# Patient Record
Sex: Female | Born: 1945 | Race: White | Hispanic: No | Smoking: Former smoker
Health system: Southern US, Community
[De-identification: ages and names within clinical notes are randomized; demographics above are authoritative.]

## PROBLEM LIST (undated history)

## (undated) DIAGNOSIS — F419 Anxiety disorder, unspecified: Secondary | ICD-10-CM

## (undated) DIAGNOSIS — F32A Depression, unspecified: Secondary | ICD-10-CM

## (undated) DIAGNOSIS — F17200 Nicotine dependence, unspecified, uncomplicated: Secondary | ICD-10-CM

## (undated) DIAGNOSIS — R9389 Abnormal findings on diagnostic imaging of other specified body structures: Secondary | ICD-10-CM

## (undated) DIAGNOSIS — F329 Major depressive disorder, single episode, unspecified: Secondary | ICD-10-CM

## (undated) HISTORY — PX: CATARACT EXTRACTION, BILATERAL: SHX1313

## (undated) HISTORY — DX: Major depressive disorder, single episode, unspecified: F32.9

## (undated) HISTORY — DX: Depression, unspecified: F32.A

## (undated) HISTORY — DX: Abnormal findings on diagnostic imaging of other specified body structures: R93.89

## (undated) HISTORY — DX: Nicotine dependence, unspecified, uncomplicated: F17.200

## (undated) HISTORY — DX: Anxiety disorder, unspecified: F41.9

---
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
## 1977-07-07 HISTORY — PX: ABDOMINAL HYSTERECTOMY: SHX81

## 1999-07-16 ENCOUNTER — Encounter: Admission: RE | Admit: 1999-07-16 | Discharge: 1999-07-16 | Payer: Self-pay | Admitting: Family Medicine

## 1999-07-16 ENCOUNTER — Encounter: Payer: Self-pay | Admitting: Family Medicine

## 1999-07-29 ENCOUNTER — Encounter: Admission: RE | Admit: 1999-07-29 | Discharge: 1999-07-29 | Payer: Self-pay | Admitting: Family Medicine

## 1999-07-29 ENCOUNTER — Encounter: Payer: Self-pay | Admitting: Family Medicine

## 2000-01-28 ENCOUNTER — Encounter: Admission: RE | Admit: 2000-01-28 | Discharge: 2000-01-28 | Payer: Self-pay | Admitting: Family Medicine

## 2000-01-28 ENCOUNTER — Encounter: Payer: Self-pay | Admitting: Family Medicine

## 2001-01-20 ENCOUNTER — Encounter: Payer: Self-pay | Admitting: Family Medicine

## 2001-01-20 ENCOUNTER — Encounter: Admission: RE | Admit: 2001-01-20 | Discharge: 2001-01-20 | Payer: Self-pay | Admitting: Family Medicine

## 2001-01-21 ENCOUNTER — Ambulatory Visit (HOSPITAL_COMMUNITY): Admission: RE | Admit: 2001-01-21 | Discharge: 2001-01-21 | Payer: Self-pay | Admitting: Family Medicine

## 2001-01-21 ENCOUNTER — Encounter: Payer: Self-pay | Admitting: Family Medicine

## 2001-01-26 ENCOUNTER — Encounter: Admission: RE | Admit: 2001-01-26 | Discharge: 2001-01-26 | Payer: Self-pay | Admitting: Family Medicine

## 2001-01-26 ENCOUNTER — Encounter: Payer: Self-pay | Admitting: Family Medicine

## 2001-03-18 ENCOUNTER — Other Ambulatory Visit: Admission: RE | Admit: 2001-03-18 | Discharge: 2001-03-18 | Payer: Self-pay | Admitting: Family Medicine

## 2001-12-08 ENCOUNTER — Encounter: Payer: Self-pay | Admitting: Family Medicine

## 2001-12-08 ENCOUNTER — Encounter: Admission: RE | Admit: 2001-12-08 | Discharge: 2001-12-08 | Payer: Self-pay | Admitting: Family Medicine

## 2002-03-21 ENCOUNTER — Other Ambulatory Visit: Admission: RE | Admit: 2002-03-21 | Discharge: 2002-03-21 | Payer: Self-pay | Admitting: Family Medicine

## 2002-05-16 ENCOUNTER — Encounter: Payer: Self-pay | Admitting: Family Medicine

## 2002-05-16 ENCOUNTER — Encounter: Admission: RE | Admit: 2002-05-16 | Discharge: 2002-05-16 | Payer: Self-pay | Admitting: Family Medicine

## 2002-07-26 ENCOUNTER — Emergency Department (HOSPITAL_COMMUNITY): Admission: EM | Admit: 2002-07-26 | Discharge: 2002-07-26 | Payer: Self-pay | Admitting: Emergency Medicine

## 2002-07-26 ENCOUNTER — Encounter: Payer: Self-pay | Admitting: Emergency Medicine

## 2003-02-24 ENCOUNTER — Ambulatory Visit (HOSPITAL_COMMUNITY): Admission: RE | Admit: 2003-02-24 | Discharge: 2003-02-24 | Payer: Self-pay | Admitting: Gastroenterology

## 2003-02-24 ENCOUNTER — Encounter (INDEPENDENT_AMBULATORY_CARE_PROVIDER_SITE_OTHER): Payer: Self-pay | Admitting: Specialist

## 2003-03-08 ENCOUNTER — Encounter: Payer: Self-pay | Admitting: Gastroenterology

## 2003-03-08 ENCOUNTER — Encounter: Admission: RE | Admit: 2003-03-08 | Discharge: 2003-03-08 | Payer: Self-pay | Admitting: Gastroenterology

## 2003-07-08 DIAGNOSIS — R9389 Abnormal findings on diagnostic imaging of other specified body structures: Secondary | ICD-10-CM

## 2003-07-08 HISTORY — DX: Abnormal findings on diagnostic imaging of other specified body structures: R93.89

## 2004-02-25 ENCOUNTER — Emergency Department (HOSPITAL_COMMUNITY): Admission: EM | Admit: 2004-02-25 | Discharge: 2004-02-25 | Payer: Self-pay | Admitting: Emergency Medicine

## 2004-07-12 ENCOUNTER — Encounter: Admission: RE | Admit: 2004-07-12 | Discharge: 2004-07-12 | Payer: Self-pay | Admitting: Family Medicine

## 2005-07-31 ENCOUNTER — Encounter: Admission: RE | Admit: 2005-07-31 | Discharge: 2005-07-31 | Payer: Self-pay | Admitting: Family Medicine

## 2006-08-20 ENCOUNTER — Encounter: Admission: RE | Admit: 2006-08-20 | Discharge: 2006-08-20 | Payer: Self-pay | Admitting: Family Medicine

## 2009-08-10 ENCOUNTER — Ambulatory Visit: Payer: Self-pay | Admitting: Family Medicine

## 2009-12-28 ENCOUNTER — Emergency Department (HOSPITAL_COMMUNITY): Admission: EM | Admit: 2009-12-28 | Discharge: 2009-12-28 | Payer: Self-pay | Admitting: Family Medicine

## 2010-02-07 ENCOUNTER — Encounter: Admission: RE | Admit: 2010-02-07 | Discharge: 2010-02-07 | Payer: Self-pay | Admitting: Family Medicine

## 2010-04-09 ENCOUNTER — Emergency Department (HOSPITAL_BASED_OUTPATIENT_CLINIC_OR_DEPARTMENT_OTHER): Admission: EM | Admit: 2010-04-09 | Discharge: 2010-04-09 | Payer: Self-pay | Admitting: Emergency Medicine

## 2010-07-28 ENCOUNTER — Encounter: Payer: Self-pay | Admitting: Family Medicine

## 2010-07-29 ENCOUNTER — Encounter: Payer: Self-pay | Admitting: Family Medicine

## 2010-08-08 NOTE — Assessment & Plan Note (Signed)
Summary: CHECK FINGER AND LIP/TJ   Vital Signs:  Patient Profile:   65 Years Old Female CC:      sore on right thumb and red bumps on right leg Height:     63 inches Weight:      139 pounds O2 Sat:      99 % O2 treatment:    Room Air Temp:     97.1 degrees F oral Pulse rate:   84 / minute Pulse rhythm:   regular Resp:     14 per minute BP sitting:   117 / 78  (right arm) Cuff size:   regular  Pt. in pain?   no  Vitals Entered By: Lita Mains, RN                   Updated Prior Medication List: Prudy Feeler 0.25 MG TABS (ALPRAZOLAM) tid EFFEXOR XR 37.5 MG XR24H-CAP (VENLAFAXINE HCL) once daily  Current Allergies: ! SULFA ! PENICILLIN G POTASSIUM (PENICILLIN G POTASSIUM)History of Present Illness History from: patient Chief Complaint: sore on right thumb and red bumps on right leg History of Present Illness: Subjective:  Patient complains of several day history of sore crack on tip of right thumb, now almost healed.  She also noticed an itchy red spon on her right leg just above the knee, but states that it has resolved.  REVIEW OF SYSTEMS       Skin       Complains of unusual moles/lumps or sores.      Comments: right thumb Other Comments: worried about infection, recently close to septic patient. 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  Area is nontender with eschar in place Right lower leg:  No lesions present. Assessment New Problems: WORRIED WELL (ICD-V65.5) FAMILY HISTORY DIABETES 1ST DEGREE RELATIVE (ICD-V18.0)   Plan New  Orders: New Patient Level III [99203] Planning Comments:   Reassurance.  Fingertip bandaid applied.   The patient and/or caregiver has been counseled thoroughly with regard to medications prescribed including dosage, schedule, interactions, rationale for use, and possible side effects and they verbalize understanding.  Diagnoses and expected course of recovery discussed and will return if not improved as expected or if the condition worsens. Patient and/or caregiver verbalized understanding.

## 2010-08-17 ENCOUNTER — Ambulatory Visit (INDEPENDENT_AMBULATORY_CARE_PROVIDER_SITE_OTHER): Payer: Self-pay | Admitting: Emergency Medicine

## 2010-08-17 ENCOUNTER — Encounter: Payer: Self-pay | Admitting: Emergency Medicine

## 2010-08-17 DIAGNOSIS — L255 Unspecified contact dermatitis due to plants, except food: Secondary | ICD-10-CM | POA: Insufficient documentation

## 2010-08-22 NOTE — Assessment & Plan Note (Signed)
Summary: RASH/WSE (rm 4)   Vital Signs:  Patient Profile:   65 Years Old Female CC:      ?poison ivy or oak x 2 days Height:     63 inches Weight:      126 pounds O2 Sat:      99 % O2 treatment:    Room Air Temp:     98.6 degrees F oral Pulse rate:   65 / minute Resp:     16 per minute BP sitting:   106 / 66  (left arm) Cuff size:   regular  Vitals Entered By: Lajean Saver RN (August 17, 2010 11:01 AM)                  Updated Prior Medication List: XANAX 0.25 MG TABS (ALPRAZOLAM) tid EFFEXOR XR 37.5 MG XR24H-CAP (VENLAFAXINE HCL) once daily  Current Allergies (reviewed today): ! SULFA ! PENICILLIN G POTASSIUM (PENICILLIN G POTASSIUM)History of Present Illness Chief Complaint: ?poison ivy or oak x 2 days History of Present Illness: Working in her yard 2 days ago. c/o pruritic rash on trunk and extremities, now spreading to neck and face. X 2 days. OTC topical not helping. No lip swelling or wheezing.  In the past, had similar poison oak, and she requests "cortisone shot and prednisone dosepak"  REVIEW OF SYSTEMS Constitutional Symptoms      Denies fever, chills, night sweats, weight loss, weight gain, and fatigue.  Eyes       Denies change in vision, eye pain, eye discharge, glasses, contact lenses, and eye surgery. Ear/Nose/Throat/Mouth       Denies hearing loss/aids, change in hearing, ear pain, ear discharge, dizziness, frequent runny nose, frequent nose bleeds, sinus problems, sore throat, hoarseness, and tooth pain or bleeding.  Respiratory       Denies dry cough, productive cough, wheezing, shortness of breath, asthma, bronchitis, and emphysema/COPD.  Cardiovascular       Denies murmurs, chest pain, and tires easily with exhertion.    Gastrointestinal       Denies stomach pain, nausea/vomiting, diarrhea, constipation, blood in bowel movements, and indigestion. Genitourniary       Denies painful urination, kidney stones, and loss of urinary  control. Neurological       Denies paralysis, seizures, and fainting/blackouts. Musculoskeletal       Denies muscle pain, joint pain, joint stiffness, decreased range of motion, redness, swelling, muscle weakness, and gout.  Skin       Denies bruising, unusual mles/lumps or sores, and hair/skin or nail changes.      Comments: arms and neck Psych       Denies mood changes, temper/anger issues, anxiety/stress, speech problems, depression, and sleep problems. Other Comments: patient c/o rash to Bilateral arms and neck x 2 days. She did yard work about 3 days ago   Past History:  Past Medical History: Reviewed history from 08/10/2009 and no changes required. Unremarkable  Past Surgical History: Reviewed history from 08/10/2009 and no changes required. Hysterectomy  Family History: Reviewed history from 08/10/2009 and no changes required. Family History Diabetes 1st degree relative  Social History: Reviewed history from 08/10/2009 and no changes required. 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 No lip swelling. Eyes: conjunctivae and lids normal Oral/Pharynx: clear.  Chest/Lungs: no rales, wheezes, or rhonchi bilateral, breath sounds equal without effort Heart: regular rate and  rhythm,  no murmur Skin: vesicular , mp rash in streaks and clusters, arms, legs, trunck, neck, and few patches on face Assessment New Problems: CONTACT DERMATITIS&OTHER ECZEMA DUE TO PLANTS (ICD-692.6)   Plan New Medications/Changes: PREDNISONE (PAK) 10 MG TABS (PREDNISONE) 6 day dosepak as directed  #21 x 0, 08/17/2010, Lajean Manes MD  New Orders: Est. Patient Level III [04540] Depo- Medrol 80mg  [J1040] Admin of Therapeutic Inj  intramuscular or subcutaneous [96372] Planning Comments:   Depomedrol IM. Risks, benefits, alternatives discussed. Pt voiced  understanding and agreement.  Other sxs care discussed. OTC by mouth benadryl as needed itch.  Follow Up: Follow up on an as needed basis, Follow up with Primary Physician  The patient and/or caregiver has been counseled thoroughly with regard to medications prescribed including dosage, schedule, interactions, rationale for use, and possible side effects and they verbalize understanding.  Diagnoses and expected course of recovery discussed and will return if not improved as expected or if the condition worsens. Patient and/or caregiver verbalized understanding.  Prescriptions: PREDNISONE (PAK) 10 MG TABS (PREDNISONE) 6 day dosepak as directed  #21 x 0   Entered and Authorized by:   Lajean Manes MD   Signed by:   Lajean Manes MD on 08/17/2010   Method used:   Handwritten   RxID:   [id number]   Medication Administration  Injection # 1:    Medication: Depo- Medrol 40mg     Diagnosis: CONTACT DERMATITIS&OTHER ECZEMA DUE TO PLANTS (ICD-692.6)    Route: IM    Site: RUOQ gluteus    Exp Date: 01/05/2011    Lot #: [id number]    Mfr: pfizer    Patient tolerated injection without complications    Given by: Lajean Saver RN (August 17, 2010 11:29 AM)  Orders Added: 1)  Est. Patient Level III [08657] 2)  Depo- Medrol 80mg  [J1040] 3)  Admin of Therapeutic Inj  intramuscular or subcutaneous [84696]

## 2010-11-22 NOTE — Op Note (Signed)
NAME:  Molly Welch, Molly Welch                        ACCOUNT NO.:  [id number]   MEDICAL RECORD NO.:  [id number]                   PATIENT TYPE:  AMB   LOCATION:  ENDO                                 FACILITY:  [facility]   PHYSICIAN:  Anselmo Rod, M.D.               DATE OF BIRTH:  March 09, 1946   DATE OF PROCEDURE:  02/24/2003  DATE OF DISCHARGE:  02/24/2003                                 OPERATIVE REPORT   PROCEDURE:  Colonoscopy with random biopsies.   ENDOSCOPIST:  Anselmo Rod, M.D.   INSTRUMENT:  Olympus video colonoscope.   INDICATIONS FOR PROCEDURE:  History of change in bowel habits with severe  diarrhea in a 65 year old white female. Rule out colonic polyps, masses,  collagenous colitis, etc.   PRE-PROCEDURE PREPARATION:  Informed consent was procured from the patient.  Patient was fasted for 8 hours prior to the procedure and prepped with a  bottle of magnesium citrate and a gallon of GoLYTELY the night prior to the  procedure.   Pre-procedure physical:  Patient had stable vital signs, neck supple, chest  clear to auscultation, S1/S2 regular, abdomen soft with normal bowel sounds.   DESCRIPTION OF PROCEDURE:  The patient was placed in the left lateral  decubitus position, sedated with 70 mg of Demerol and 7 mg of Versed  intravenously.  Once the patient was adequately sedated and maintained on  low flow oxygen, continuous cardiac monitoring; the Olympus video  colonoscope was advanced from the rectum to the cecum, with slight  difficulty. There was some residual stool in the colon.  Multiple biopsies  were done from the colon to rule out collagenous colitis. There was some  loss of vascular pattern with no definite ulceration seen in the rectum.  This area was also biopsied to rule out proctitis. There were a few left-  sided diverticula present.  The rest of the exam was unrevealing.  The appendiceal orifice and the ileocecal valve were visualized and  photographed.  The terminal ileum appeared normal.   IMPRESSION:  1. Few early, left-sided diverticula.  2. Random colon and rectal biopsies done to rule out colitis versus     proctitis, respectively.  3. No masses or polyps seen.   RECOMMENDATIONS:  1. Await pathology results.  2  A high fiber diet with liberal fluids intake.  1.     Repeat colorectal cancer screening in the next 10 years unless the patient     develops any abdominal symptoms in the interim.  2. Outpatient follow in the next 7-10 days for further recommendations.                                                 Anselmo Rod, M.D.    JNM/MEDQ  D:  02/26/2003  T:  02/26/2003  Job:  [id number]   cc:   Gloriajean Dell. Andrey Campanile, M.D.  [address]  [employer]  [place] [address]  Fax: [phone]

## 2012-08-13 ENCOUNTER — Other Ambulatory Visit: Payer: Self-pay | Admitting: Obstetrics and Gynecology

## 2013-04-30 ENCOUNTER — Emergency Department
Admission: EM | Admit: 2013-04-30 | Discharge: 2013-04-30 | Disposition: A | Discharge status: Home / Self Care | Payer: Medicare Other | Source: Home / Self Care | Attending: Family Medicine | Admitting: Family Medicine

## 2013-04-30 ENCOUNTER — Encounter: Payer: Self-pay | Admitting: Emergency Medicine

## 2013-04-30 DIAGNOSIS — F419 Anxiety disorder, unspecified: Secondary | ICD-10-CM | POA: Insufficient documentation

## 2013-04-30 DIAGNOSIS — N3000 Acute cystitis without hematuria: Secondary | ICD-10-CM

## 2013-04-30 DIAGNOSIS — R300 Dysuria: Secondary | ICD-10-CM

## 2013-04-30 LAB — POCT URINALYSIS DIP (MANUAL ENTRY)
Bilirubin, UA: NEGATIVE
Glucose, UA: NEGATIVE
Ketones, POC UA: NEGATIVE
Nitrite, UA: NEGATIVE
Protein Ur, POC: NEGATIVE
Spec Grav, UA: 1.02 (ref 1.005–1.03)
Urobilinogen, UA: 0.2 (ref 0–1)
pH, UA: 7 (ref 5–8)

## 2013-04-30 MED ORDER — CIPROFLOXACIN HCL 250 MG PO TABS: ORAL_TABLET | ORAL | Status: DC

## 2013-04-30 NOTE — ED Notes (Signed)
Molly Welch complains of frequent and painful urination for 1 day. She also has had chills and headache. Denies fever or sweats.

## 2013-04-30 NOTE — ED Provider Notes (Signed)
CSN: [id number]     Arrival date & time 04/30/13  0912 History   First MD Initiated Contact with Patient 04/30/13 [phone]     Chief Complaint  Patient presents with  . Dysuria    1 day  . Urinary Frequency    1 day      HPI Comments: Patient complains of 3 day history of mild dysuria, dark urine, and increased stress incontinence. No abdominal, pelvic, or back pain.  No nausea/vomiting.  No fevers, chills, and sweats.  No vaginal discharge.  She reports that she has recently become sexually active.  Patient is a 67 y.o. female presenting with dysuria. The history is provided by the patient.  Dysuria Pain quality:  Burning Pain severity:  Mild Onset quality:  Sudden Duration:  3 days Timing:  Constant Progression:  Unchanged Chronicity:  New Recent urinary tract infections: no   Relieved by:  Nothing Worsened by:  Nothing tried Ineffective treatments:  None tried Urinary symptoms: discolored urine, foul-smelling urine, frequent urination and incontinence   Urinary symptoms: no hematuria and no hesitancy   Associated symptoms: no abdominal pain, no fever, no flank pain, no genital lesions, no nausea, no vaginal discharge and no vomiting   Risk factors: sexually active     Past Medical History  Diagnosis Date  . Anxiety    History reviewed. No pertinent past surgical history. Family History  Problem Relation Age of Onset  . Diabetes Other    History  Substance Use Topics  . Smoking status: Current Every Day Smoker  . Smokeless tobacco: Never Used  . Alcohol Use: No   OB History   Grav Para Term Preterm Abortions TAB SAB Ect Mult Living                 Review of Systems  Constitutional: Negative for fever.  Gastrointestinal: Negative for nausea, vomiting and abdominal pain.  Genitourinary: Positive for dysuria. Negative for flank pain and vaginal discharge.  All other systems reviewed and are negative.    Allergies  Penicillins and Sulfonamide derivatives  Home  Medications   Current Outpatient Rx  Name  Route  Sig  Dispense  Refill  . ALPRAZolam (XANAX) 1 MG tablet   Oral   Take 1 mg by mouth at bedtime as needed for sleep.         Marland Kitchen venlafaxine (EFFEXOR) 75 MG tablet   Oral   Take 75 mg by mouth 2 (two) times daily.         . ciprofloxacin (CIPRO) 250 MG tablet      Take one tab by mouth every 12 hours for 3 days.   6 tablet   0    BP 139/78  Pulse 69  Temp(Src) 97.8 F (36.6 C) (Oral)  Wt 110 lb (49.896 kg)  BMI 19.49 kg/m2 Physical Exam Nursing notes and Vital Signs reviewed. Appearance:  Patient appears healthy, stated age, and in no acute distress Eyes:  Pupils are equal, round, and reactive to light and accomodation.  Extraocular movement is intact.  Conjunctivae are not inflamed   Pharynx:  Normal; moist mucous membranes  Neck:  Supple.  No adenopathy Lungs:  Clear to auscultation.  Breath sounds are equal.  Heart:  Regular rate and rhythm without murmurs, rubs, or gallops.  Abdomen:  Nontender without masses or hepatosplenomegaly.  Bowel sounds are present.  No CVA or flank tenderness.  Extremities:  No edema.  No calf tenderness Skin:  No rash present.  ED Course  Procedures  none    Labs Reviewed  POCT URINALYSIS DIP (MANUAL ENTRY) - Abnormal; Notable for the following:  BLO small; LEU small  URINE CULTURE pending         MDM   1. Dysuria   2. Acute cystitis    Urine culture pending.   Begin low dose Cipro:  250mg  BID for 3 days. Increase fluid intake.  May take AZO if desired for urinary discomfort. Followup with Family Doctor if not improved in about 3 days.    Lattie Haw, MD 04/30/13 1003

## 2013-05-01 ENCOUNTER — Telehealth: Payer: Self-pay | Admitting: Emergency Medicine

## 2013-05-01 LAB — URINE CULTURE: Colony Count: 40000

## 2013-08-05 ENCOUNTER — Encounter: Payer: Self-pay | Admitting: Family Medicine

## 2015-10-11 ENCOUNTER — Other Ambulatory Visit: Payer: Self-pay | Admitting: Family Medicine

## 2015-10-11 DIAGNOSIS — Z1231 Encounter for screening mammogram for malignant neoplasm of breast: Secondary | ICD-10-CM

## 2015-10-11 DIAGNOSIS — N951 Menopausal and female climacteric states: Secondary | ICD-10-CM

## 2015-10-15 ENCOUNTER — Other Ambulatory Visit: Payer: Self-pay | Admitting: Family Medicine

## 2015-10-15 DIAGNOSIS — E2839 Other primary ovarian failure: Secondary | ICD-10-CM

## 2015-12-15 ENCOUNTER — Other Ambulatory Visit: Payer: Self-pay | Admitting: Family Medicine

## 2015-12-15 DIAGNOSIS — M8580 Other specified disorders of bone density and structure, unspecified site: Secondary | ICD-10-CM

## 2015-12-15 DIAGNOSIS — E2839 Other primary ovarian failure: Secondary | ICD-10-CM

## 2016-01-01 ENCOUNTER — Ambulatory Visit
Admission: RE | Admit: 2016-01-01 | Discharge: 2016-01-01 | Disposition: A | Discharge status: Home / Self Care | Payer: Medicare Other | Source: Ambulatory Visit | Attending: Family Medicine | Admitting: Family Medicine

## 2016-01-01 DIAGNOSIS — Z1231 Encounter for screening mammogram for malignant neoplasm of breast: Secondary | ICD-10-CM

## 2016-01-01 DIAGNOSIS — M8580 Other specified disorders of bone density and structure, unspecified site: Secondary | ICD-10-CM

## 2016-01-01 DIAGNOSIS — E2839 Other primary ovarian failure: Secondary | ICD-10-CM

## 2016-05-12 ENCOUNTER — Emergency Department (HOSPITAL_COMMUNITY)
Admission: EM | Admit: 2016-05-12 | Discharge: 2016-05-13 | Disposition: A | Discharge status: Home / Self Care | Payer: Medicare Other | Attending: Emergency Medicine | Admitting: Emergency Medicine

## 2016-05-12 ENCOUNTER — Encounter (HOSPITAL_COMMUNITY): Payer: Self-pay

## 2016-05-12 DIAGNOSIS — R42 Dizziness and giddiness: Secondary | ICD-10-CM | POA: Insufficient documentation

## 2016-05-12 DIAGNOSIS — F17200 Nicotine dependence, unspecified, uncomplicated: Secondary | ICD-10-CM | POA: Diagnosis not present

## 2016-05-12 LAB — CBC WITH DIFFERENTIAL/PLATELET
Basophils Absolute: 0 10*3/uL (ref 0.0–0.1)
Basophils Relative: 0 %
Eosinophils Absolute: 0 10*3/uL (ref 0.0–0.7)
Eosinophils Relative: 0 %
HCT: 39.2 % (ref 36.0–46.0)
Hemoglobin: 13.1 g/dL (ref 12.0–15.0)
Lymphocytes Relative: 18 %
Lymphs Abs: 2.2 10*3/uL (ref 0.7–4.0)
MCH: 30 pg (ref 26.0–34.0)
MCHC: 33.4 g/dL (ref 30.0–36.0)
MCV: 89.7 fL (ref 78.0–100.0)
Monocytes Absolute: 0.7 10*3/uL (ref 0.1–1.0)
Monocytes Relative: 6 %
Neutro Abs: 9.1 10*3/uL — ABNORMAL HIGH (ref 1.7–7.7)
Neutrophils Relative %: 76 %
Platelets: 342 10*3/uL (ref 150–400)
RBC: 4.37 MIL/uL (ref 3.87–5.11)
RDW: 13.1 % (ref 11.5–15.5)
WBC: 12 10*3/uL — ABNORMAL HIGH (ref 4.0–10.5)

## 2016-05-12 LAB — BASIC METABOLIC PANEL
Anion gap: 5 (ref 5–15)
BUN: 13 mg/dL (ref 6–20)
CO2: 28 mmol/L (ref 22–32)
Calcium: 9.3 mg/dL (ref 8.9–10.3)
Chloride: 105 mmol/L (ref 101–111)
Creatinine, Ser: 0.95 mg/dL (ref 0.44–1.00)
GFR calc Af Amer: >60 mL/min (ref >60)
GFR calc non Af Amer: >60 mL/min (ref >60)
Glucose, Bld: 111 mg/dL — ABNORMAL HIGH (ref 65–99)
Potassium: 4 mmol/L (ref 3.5–5.1)
Sodium: 138 mmol/L (ref 135–145)

## 2016-05-12 MED ORDER — SODIUM CHLORIDE 0.9 % IV BOLUS (SEPSIS)
1000.0000 mL | Freq: Once | INTRAVENOUS | Status: AC
  Administered 2016-05-12: 1000 mL via INTRAVENOUS

## 2016-05-12 NOTE — ED Notes (Signed)
Bed: VX79 Expected date:  Expected time:  Means of arrival:  Comments: 70 yo F/ Nausea

## 2016-05-12 NOTE — ED Provider Notes (Signed)
[facility] DEPT Provider Note   CSN: [id number] Arrival date & time: 05/12/16  2102     History   Chief Complaint Chief Complaint  Patient presents with  . Nausea    HPI Molly Welch is a 70 y.o. female.  HPI  70 year old female presents for evaluation of dizziness. About 3 hours ago she was on the couch watching TV. She stood up and immediately felt a room spinning sensation as well as nausea. She was dry heaving for several minutes. The dizziness overall lasted about 8 minutes. When she sat down it improved and then finally went away. EMS was called and gave her 2 doses of IV Zofran. The nausea is now gone. On her ride with EMS she said she had 2 seconds of right-sided chest pain. This has happened before with anxiety. Otherwise she has not had any chest pain or pressure. There was no headache or focal weakness. She states her left arm was tremulous after this happened but was not weak. There was never slurred speech. She endorses chronic poor fluid intake but no recent vomiting or diarrhea. No urinary symptoms. While waiting for the provider she got up and walked to the bathroom without dizziness.  Past Medical History:  Diagnosis Date  . Abnormal chest x-ray 2005   COPD changes (2005) + interstitial lung dz changes (2002)  . Anxiety and depression    Prozac, celexa, paxil, wellbutrin, and buspar all failures.  Pt has stated xanax is only med that helps.  . Tobacco dependence     Patient Active Problem List   Diagnosis Date Noted  . Anxiety   . CONTACT DERMATITIS&OTHER ECZEMA DUE TO PLANTS 08/17/2010    History reviewed. No pertinent surgical history.  OB History    No data available       Home Medications    Prior to Admission medications   Medication Sig Start Date End Date Taking? Authorizing Provider  ALPRAZolam Duanne Moron) 0.5 MG tablet Take 0.5 mg by mouth 3 (three) times daily as needed for anxiety.  03/20/16  Yes Historical Provider, MD  CALCIUM PO Take  1 tablet by mouth every morning.   Yes Historical Provider, MD  ibuprofen (ADVIL,MOTRIN) 200 MG tablet Take 400 mg by mouth every 6 (six) hours as needed for mild pain or moderate pain.   Yes Historical Provider, MD  lamoTRIgine (LAMICTAL) 25 MG tablet Take 25 mg by mouth every morning. 05/01/16  Yes Historical Provider, MD  Multiple Vitamins-Minerals (CENTRUM SILVER 50+WOMEN) TABS Take 1 tablet by mouth every morning.   Yes Historical Provider, MD  Omega-3 Fatty Acids (FISH OIL) 1000 MG CAPS Take 1,000 mg by mouth every morning.   Yes Historical Provider, MD  vitamin C (ASCORBIC ACID) 500 MG tablet Take 1,000 mg by mouth every morning.   Yes Historical Provider, MD  ciprofloxacin (CIPRO) 250 MG tablet Take one tab by mouth every 12 hours for 3 days. Patient not taking: Reported on 05/12/2016 04/30/13   Kandra Nicolas, MD    Family History Family History  Problem Relation Age of Onset  . Diabetes Other     Social History Social History  Substance Use Topics  . Smoking status: Current Every Day Smoker  . Smokeless tobacco: Never Used  . Alcohol use No     Allergies   Doxycycline; Prednisone; Penicillins; and Sulfonamide derivatives   Review of Systems Review of Systems  Constitutional: Negative for fever.  Eyes: Negative for visual disturbance.  Respiratory: Positive for  chest tightness (for 2 seconds).   Cardiovascular: Negative for chest pain.  Gastrointestinal: Positive for nausea and vomiting. Negative for abdominal pain.  Neurological: Positive for dizziness. Negative for syncope, weakness, numbness and headaches.  All other systems reviewed and are negative.    Physical Exam Updated Vital Signs BP 127/77 (BP Location: Left Arm)   Pulse 75   Temp 98.5 F (36.9 C) (Oral)   Resp 16   SpO2 100%   Physical Exam  Constitutional: She is oriented to person, place, and time. She appears well-developed and well-nourished.  HENT:  Head: Normocephalic and atraumatic.    Right Ear: External ear normal.  Left Ear: External ear normal.  Nose: Nose normal.  Mouth/Throat: Oropharynx is clear and moist.  Eyes: EOM are normal. Pupils are equal, round, and reactive to light. Right eye exhibits no discharge. Left eye exhibits no discharge.  Neck: Neck supple.  Cardiovascular: Normal rate, regular rhythm and normal heart sounds.   No murmur heard. Pulmonary/Chest: Effort normal and breath sounds normal.  Abdominal: Soft. There is no tenderness.  Neurological: She is alert and oriented to person, place, and time.  CN 3-12 grossly intact. 5/5 strength in all 4 extremities. Grossly normal sensation. Normal finger to nose. Normal gait  Skin: Skin is warm and dry.  Nursing note and vitals reviewed.    ED Treatments / Results  Labs (all labs ordered are listed, but only abnormal results are displayed) Labs Reviewed  BASIC METABOLIC PANEL - Abnormal; Notable for the following:       Result Value   Glucose, Bld 111 (*)    All other components within normal limits  CBC WITH DIFFERENTIAL/PLATELET - Abnormal; Notable for the following:    WBC 12.0 (*)    Neutro Abs 9.1 (*)    All other components within normal limits    EKG  EKG Interpretation  Date/Time:  Monday May 12 2016 21:28:20 EST Ventricular Rate:  66 PR Interval:    QRS Duration: 93 QT Interval:  442 QTC Calculation: 464 R Axis:   56 Text Interpretation:  Age not entered, assumed to be  70 years old for purpose of ECG interpretation Sinus rhythm no acute ST/T changes No old tracing to compare Confirmed by Amada Hallisey MD, Hopelynn Gartland [id number]) on 05/13/2016 12:59:21 AM       Radiology No results found.  Procedures Procedures (including critical care time)  Medications Ordered in ED Medications  sodium chloride 0.9 % bolus 1,000 mL (0 mLs Intravenous Stopped 05/13/16 0013)     Initial Impression / Assessment and Plan / ED Course  I have reviewed the triage vital signs and the nursing  notes.  Pertinent labs & imaging results that were available during my care of the patient were reviewed by me and considered in my medical decision making (see chart for details).  Clinical Course as of May 13 99  Mon May 12, 2016  2307 Orthostatic hypotension vs transient peripheral veritgo. Asymptomatic now. Neuro exam benign. No significant cardiac symptoms. ECG, labs, fluids, and re-eval, likely dc home  [SG]    Clinical Course User Index [SG] Sherwood Gambler, MD    Patient overall appears well, no current symptoms. Exam is unremarkable including gait testing and neuro testing. Either had transient vertigo versus orthostatic changes. She is currently asymptomatic. Her lab work is unrevealing and ECG shows no acute cause of near-syncope. At this point I feel she is stable for discharge home as my suspicion for acute stroke or  neurologic emergency is quite low. I also highly doubt cardiac arrhythmia or other concerning cause of near-syncope. Discharge home with increased fluid intake and follow-up with PCP.  Final Clinical Impressions(s) / ED Diagnoses   Final diagnoses:  Dizziness    New Prescriptions Discharge Medication List as of 05/13/2016 12:03 AM       Sherwood Gambler, MD 05/13/16 0100

## 2016-05-12 NOTE — ED Triage Notes (Signed)
Per EMS- 2 hours ago had nausea . Dry heaving and dizziness 45 minutes ago. '8mg'$  of zofran given with some relief.

## 2017-04-09 ENCOUNTER — Other Ambulatory Visit: Payer: Self-pay | Admitting: Family Medicine

## 2017-04-09 DIAGNOSIS — Z1231 Encounter for screening mammogram for malignant neoplasm of breast: Secondary | ICD-10-CM

## 2017-04-27 ENCOUNTER — Ambulatory Visit
Admission: RE | Admit: 2017-04-27 | Discharge: 2017-04-27 | Disposition: A | Discharge status: Home / Self Care | Payer: Medicare Other | Source: Ambulatory Visit | Attending: Family Medicine | Admitting: Family Medicine

## 2017-04-27 DIAGNOSIS — Z1231 Encounter for screening mammogram for malignant neoplasm of breast: Secondary | ICD-10-CM

## 2017-04-28 ENCOUNTER — Other Ambulatory Visit: Payer: Self-pay | Admitting: Family Medicine

## 2017-04-28 DIAGNOSIS — R928 Other abnormal and inconclusive findings on diagnostic imaging of breast: Secondary | ICD-10-CM

## 2017-05-05 ENCOUNTER — Other Ambulatory Visit: Payer: Medicare Other

## 2017-05-07 ENCOUNTER — Ambulatory Visit
Admission: RE | Admit: 2017-05-07 | Discharge: 2017-05-07 | Disposition: A | Discharge status: Home / Self Care | Payer: Medicare Other | Source: Ambulatory Visit | Attending: Family Medicine | Admitting: Family Medicine

## 2017-05-07 ENCOUNTER — Ambulatory Visit: Payer: Medicare Other

## 2017-05-07 DIAGNOSIS — R928 Other abnormal and inconclusive findings on diagnostic imaging of breast: Secondary | ICD-10-CM

## 2018-08-07 DEATH — deceased

## 2019-07-20 ENCOUNTER — Other Ambulatory Visit: Payer: Self-pay | Admitting: Physician Assistant

## 2019-07-20 DIAGNOSIS — Z13820 Encounter for screening for osteoporosis: Secondary | ICD-10-CM

## 2019-07-20 DIAGNOSIS — Z1231 Encounter for screening mammogram for malignant neoplasm of breast: Secondary | ICD-10-CM

## 2019-10-07 ENCOUNTER — Ambulatory Visit
Admission: RE | Admit: 2019-10-07 | Discharge: 2019-10-07 | Disposition: A | Discharge status: Home / Self Care | Payer: Medicare Other | Source: Ambulatory Visit | Attending: Physician Assistant | Admitting: Physician Assistant

## 2019-10-07 ENCOUNTER — Other Ambulatory Visit: Payer: Self-pay

## 2019-10-07 DIAGNOSIS — Z13820 Encounter for screening for osteoporosis: Secondary | ICD-10-CM

## 2019-10-07 DIAGNOSIS — Z1231 Encounter for screening mammogram for malignant neoplasm of breast: Secondary | ICD-10-CM

## 2019-11-24 ENCOUNTER — Encounter: Payer: Self-pay | Admitting: Neurology

## 2019-11-24 ENCOUNTER — Other Ambulatory Visit: Payer: Self-pay

## 2019-11-24 ENCOUNTER — Ambulatory Visit: Payer: Medicare Other | Admitting: Neurology

## 2019-11-24 VITALS — BP 114/80 | HR 79 | Ht 62.0 in | Wt 110.0 lb

## 2019-11-24 DIAGNOSIS — M79605 Pain in left leg: Secondary | ICD-10-CM | POA: Diagnosis not present

## 2019-11-24 DIAGNOSIS — M79601 Pain in right arm: Secondary | ICD-10-CM | POA: Diagnosis not present

## 2019-11-24 DIAGNOSIS — M79604 Pain in right leg: Secondary | ICD-10-CM | POA: Diagnosis not present

## 2019-11-24 DIAGNOSIS — M79602 Pain in left arm: Secondary | ICD-10-CM

## 2019-11-24 NOTE — Patient Instructions (Signed)
Your neurological exam does not show any obvious abnormalities, nevertheless, due to your pain in both hands and feet, we will proceed with testing in the form of blood work to look for any treatable causes of your pain.  We will also proceed with electrical nerve and muscle testing called EMG and nerve conduction velocity testing in our office.  We will do blood work today and set up your appointment for testing in our office separately.  We will keep you posted as to your results by phone call and follow-up if needed.  Please talk to your primary care nurse practitioner about your pain in the hands and feet as it may also be related to arthritis.  You may benefit from seeing a specialist such as rheumatologist.  In addition, due to pain in the left wrist, likely related to your wrist fracture last year, please make a follow-up appointment also with your orthopedic office.

## 2019-11-24 NOTE — Progress Notes (Signed)
Subjective:    Patient ID: Molly Welch is a 74 y.o. female.  HPI     Star Age, MD, PhD [facility] [address] [place], [place] [address]  Dear [name] Headings,   I saw your patient, Molly Welch, upon your kind request in my neurologic clinic today for initial consultation of her hand pain, concern for neuropathic pain.  The patient is unaccompanied today.  As you know, [name] is a 74 year old right-handed woman with with an underlying medical history of pancreatic insufficiency, irritable bowel syndrome, osteopenia, depression, anxiety, left wrist distal radius fracture in October 2020, who reports a burning sensation in both feet at the bottom of both feet for the past few months, since approximately January 2021.  She also reports pain in the left wrist, was treated with casting of the left forearm for her fracture.  She had this done through orthopedics at [facility].  She had physical therapy for her left wrist.  She has noticed pain in the right hand, particularly in the right middle finger in the joint.  She has had intermittent right knee pain.  She has not seen anybody for arthritis.  She has been on gabapentin for the burning sensation in the feet and has found it helpful but when she increased it she had double vision and felt off balance.  She is now back down to 100 mg twice daily but had increased it to up to 200 mg 3 times daily as I understand.  She has not had any recent blood work, takes vitamin B12 by mouth, had her last thyroid check at the time of her physical late last year.  She is not diabetic.  She has no family history of neuropathy, but does not have a whole lot of information about her family history.  She reports that her father committed suicide when she was 73 years old and her mother and sister were murdered.  She has 2 brothers.  She smokes about 6 cigarettes/day, has reduced this from a pack per day.  She does not drink  any alcohol and does not have a history of excessive alcohol use. I reviewed your office note from 09/22/2019.  She has been on gabapentin, currently on 600 mg 3 times daily.   Her Past Medical History Is Significant For: Past Medical History:  Diagnosis Date  . Abnormal chest x-ray 2005   COPD changes (2005) + interstitial lung dz changes (2002)  . Anxiety and depression    Prozac, celexa, paxil, wellbutrin, and buspar all failures.  Pt has stated xanax is only med that helps.  . Tobacco dependence     Her Past Surgical History Is Significant For: No past surgical history on file.  Her Family History Is Significant For: Family History  Problem Relation Age of Onset  . Diabetes Other     Her Social History Is Significant For: Social History   Socioeconomic History  . Marital status: Single    Spouse name: Not on file  . Number of children: Not on file  . Years of education: Not on file  . Highest education level: Not on file  Occupational History  . Not on file  Tobacco Use  . Smoking status: Current Every Day Smoker  . Smokeless tobacco: Never Used  Substance and Sexual Activity  . Alcohol use: No  . Drug use: No  . Sexual activity: Not on file  Other Topics Concern  . Not on file  Social History Narrative  . Not on file   Social Determinants of Health   Financial Resource Strain:   . Difficulty of Paying Living Expenses:   Food Insecurity:   . Worried About Charity fundraiser in the Last Year:   . Arboriculturist in the Last Year:   Transportation Needs:   . Film/video editor (Medical):   Marland Kitchen Lack of Transportation (Non-Medical):   Physical Activity:   . Days of Exercise per Week:   . Minutes of Exercise per Session:   Stress:   . Feeling of Stress :   Social Connections:   . Frequency of Communication with Friends and Family:   . Frequency of Social Gatherings with Friends and Family:   . Attends Religious Services:   . Active Member of Clubs or  Organizations:   . Attends Archivist Meetings:   Marland Kitchen Marital Status:     Her Allergies Are:  Allergies  Allergen Reactions  . Doxycycline Swelling  . Prednisone Other (See Comments)    Very emotional, crying, angry  . Penicillins Rash    Has patient had a PCN reaction causing immediate rash, facial/tongue/throat swelling, SOB or lightheadedness with hypotension: yes Has patient had a PCN reaction causing severe rash involving mucus membranes or skin necrosis: no Has patient had a PCN reaction that required hospitalization: no Has patient had a PCN reaction occurring within the last 10 years: no If all of the above answers are "NO", then may proceed with Cephalosporin use.   . Sulfonamide Derivatives Rash  :   Her Current Medications Are:  Outpatient Encounter Medications as of 11/24/2019  Medication Sig  . ALPRAZolam (XANAX) 0.5 MG tablet Take 0.5 mg by mouth 3 (three) times daily as needed for anxiety.   . Amylase-Lipase-Protease (CREON 10 PO) Take by mouth.  Marland Kitchen BIOTIN PO Take by mouth.  Marland Kitchen CALCIUM PO Take 1 tablet by mouth every morning.  . gabapentin (NEURONTIN) 100 MG capsule Take 100 mg by mouth.  . lamoTRIgine (LAMICTAL) 25 MG tablet Take 25 mg by mouth every morning.  . Multiple Vitamins-Minerals (CENTRUM SILVER 50+WOMEN) TABS Take 1 tablet by mouth every morning.  . Pyridoxine HCl (VITAMIN B-6 PO) Take by mouth.  . TURMERIC PO Take by mouth.  . vitamin C (ASCORBIC ACID) 500 MG tablet Take 1,000 mg by mouth every morning.  . [DISCONTINUED] Cyanocobalamin (VITAMIN B-12 PO) Take by mouth.  . [DISCONTINUED] valACYclovir (VALTREX) 500 MG tablet Take by mouth.  . [DISCONTINUED] ciprofloxacin (CIPRO) 250 MG tablet Take one tab by mouth every 12 hours for 3 days. (Patient not taking: Reported on 05/12/2016)  . [DISCONTINUED] ibuprofen (ADVIL,MOTRIN) 200 MG tablet Take 400 mg by mouth every 6 (six) hours as needed for mild pain or moderate pain.  . [DISCONTINUED] Omega-3  Fatty Acids (FISH OIL) 1000 MG CAPS Take 1,000 mg by mouth every morning.   No facility-administered encounter medications on file as of 11/24/2019.  :   Review of Systems:  Out of a complete 14 point review of systems, all are reviewed and negative with the exception of these symptoms as listed below: Review of Systems  Neurological:       Pt here to discuss worsening neuropathy pain in her bilateral hands and feet. Pt also reports increased weakness in her left hand since breaking her wrist in oct of 2020.     Objective:  Neurological Exam  Physical Exam Physical Examination:   Vitals:  11/24/19 1408  BP: 114/80  Pulse: 79  SpO2: 97%    General Examination: The patient is a very pleasant 74 y.o. female in no acute distress. She appears thin, well groomed.   HEENT: Normocephalic, atraumatic, pupils are equal, round and reactive to light. Extraocular tracking is good without limitation to gaze excursion or nystagmus noted. Normal smooth pursuit is noted. Hearing is grossly intact. Face is symmetric with normal facial animation and normal facial sensation. Speech is clear with no dysarthria noted. There is no hypophonia. There is no lip, neck/head, jaw or voice tremor. Neck is supple with full range of passive and active motion. There are no carotid bruits on auscultation. Oropharynx exam reveals: mild mouth dryness, adequate dental hygiene. Normal shoulder shrug.   Chest: Clear to auscultation without wheezing, rhonchi or crackles noted.  Heart: S1+S2+0, regular and normal without murmurs, rubs or gallops noted.   Abdomen: Soft, non-tender and non-distended with normal bowel sounds appreciated on auscultation.  Extremities: There is no pitting edema in the distal lower extremities bilaterally. Pedal pulses are intact.  Skin: Warm and dry without trophic changes noted.  Musculoskeletal: exam reveals arthritic changes in both hands particularly right middle finger.     Neurologically:  Mental status: The patient is awake, alert and oriented in all 4 spheres. Her immediate and remote memory, attention, language skills and fund of knowledge are appropriate. There is no evidence of aphasia, agnosia, apraxia or anomia. Speech is clear with normal prosody and enunciation. Thought process is linear. Mood is normal and affect is normal.  Cranial nerves II - XII are as described above under HEENT exam. In addition: shoulder shrug is normal with equal shoulder height noted. Motor exam: Thin bulk, global strength of 4+ out of 5, no focal weakness, no focal atrophy, left grip strength just a little bit weaker, probably 4 out of 5, reports residual pain in the left wrist area.  No obvious fasciculations.  Normal tone noted. There is no drift, tremor or rebound. Reflexes are 1-2+ throughout, including ankles. Babinski: Toes are flexor bilaterally. Fine motor skills and coordination: intact with normal finger taps, normal hand movements, normal rapid alternating patting, normal foot taps and normal foot agility.  Cerebellar testing: No dysmetria or intention tremor on finger to nose testing. Heel to shin is unremarkable bilaterally. There is no truncal or gait ataxia.  Sensory exam: intact to light touch, pinprick, vibration, temperature sense in the upper and lower extremities.  Gait, station and balance: She stands easily. No veering to one side is noted. No leaning to one side is noted. Posture is age-appropriate and stance is narrow based. Gait shows normal stride length and normal pace. No problems turning are noted.   Assessment and Plan:   Assessment and Plan:  In summary, EXIE CHRISMER is a very pleasant 74 y.o.-year old female with an underlying medical history of pancreatic insufficiency, irritable bowel syndrome, osteopenia, depression, anxiety, left wrist distal radius fracture in October 2020, who presents for evaluation of her burning foot pain of approximately 4  months duration.  She also reports pain in her hands, also residual pain in the left wrist.  While she may have new onset neuropathy, her examination is benign in that regard.  She has no telltale sensory deficits.  She has preserved reflexes including in the ankles.  She does have some left hand weakness which is probably secondary to pain.  She has no widespread atrophy or focal changes on exam and is  largely reassured in that regard.  She has had some symptomatic benefit from low-dose gabapentin.  She is advised that I would like to proceed with additional testing to rule out any treatable causes and to see if we can determine if she has peripheral neuropathy.  To that end, I suggested blood work today and scheduling EMG nerve conduction velocity testing in our office.  She is encouraged to talk to you about her right knee pain and arthritis type pain.  She may benefit from seeing a rheumatologist.  Furthermore, she is encouraged to talk to her orthopedic specialist about her residual wrist pain and any additional strengthening exercises or using a brace.  We will keep her posted as to her test results by phone call and follow-up in this office if needed.  I answered all her questions today and she was in agreement.   Thank you very much for allowing me to participate in the care of this nice patient. If I can be of any further assistance to you please do not hesitate to call me at [phone].  Sincerely,   Star Age, MD, PhD

## 2019-11-29 LAB — COMPREHENSIVE METABOLIC PANEL
ALT: 12 IU/L (ref 0–32)
AST: 26 IU/L (ref 0–40)
Albumin/Globulin Ratio: 1.5 (ref 1.2–2.2)
Albumin: 4.3 g/dL (ref 3.7–4.7)
Alkaline Phosphatase: 79 IU/L (ref 48–121)
BUN/Creatinine Ratio: 19 (ref 12–28)
BUN: 13 mg/dL (ref 8–27)
Bilirubin Total: 0.2 mg/dL (ref 0.0–1.2)
CO2: 25 mmol/L (ref 20–29)
Calcium: 9.7 mg/dL (ref 8.7–10.3)
Chloride: 101 mmol/L (ref 96–106)
Creatinine, Ser: 0.7 mg/dL (ref 0.57–1.00)
GFR calc Af Amer: 101 mL/min/{1.73_m2} (ref >59)
GFR calc non Af Amer: 87 mL/min/{1.73_m2} (ref >59)
Globulin, Total: 2.8 g/dL (ref 1.5–4.5)
Glucose: 83 mg/dL (ref 65–99)
Potassium: 4.4 mmol/L (ref 3.5–5.2)
Sodium: 139 mmol/L (ref 134–144)
Total Protein: 7.1 g/dL (ref 6.0–8.5)

## 2019-11-29 LAB — ANA W/REFLEX: Anti Nuclear Antibody (ANA): NEGATIVE

## 2019-11-29 LAB — MULTIPLE MYELOMA PANEL, SERUM
Albumin SerPl Elph-Mcnc: 3.8 g/dL (ref 2.9–4.4)
Albumin/Glob SerPl: 1.2 (ref 0.7–1.7)
Alpha 1: 0.3 g/dL (ref 0.0–0.4)
Alpha2 Glob SerPl Elph-Mcnc: 1.1 g/dL — ABNORMAL HIGH (ref 0.4–1.0)
B-Globulin SerPl Elph-Mcnc: 1 g/dL (ref 0.7–1.3)
Gamma Glob SerPl Elph-Mcnc: 1 g/dL (ref 0.4–1.8)
Globulin, Total: 3.3 g/dL (ref 2.2–3.9)
IgA/Immunoglobulin A, Serum: 170 mg/dL (ref 64–422)
IgG (Immunoglobin G), Serum: 950 mg/dL (ref 586–1602)
IgM (Immunoglobulin M), Srm: 142 mg/dL (ref 26–217)
M Protein SerPl Elph-Mcnc: 0.1 g/dL — ABNORMAL HIGH

## 2019-11-29 LAB — HGB A1C W/O EAG: Hgb A1c MFr Bld: 5.5 % (ref 4.8–5.6)

## 2019-11-29 LAB — B12 AND FOLATE PANEL
Folate: >20 ng/mL (ref >3.0)
Vitamin B-12: 1493 pg/mL — ABNORMAL HIGH (ref 232–1245)

## 2019-11-29 LAB — C-REACTIVE PROTEIN: CRP: 15 mg/L — ABNORMAL HIGH (ref 0–10)

## 2019-11-29 LAB — CK: Total CK: 192 U/L — ABNORMAL HIGH (ref 32–182)

## 2019-11-29 LAB — VITAMIN B6: Vitamin B6: 64.6 ug/L — ABNORMAL HIGH (ref 2.0–32.8)

## 2019-11-29 LAB — SEDIMENTATION RATE: Sed Rate: 36 mm/hr (ref 0–40)

## 2019-11-29 LAB — VITAMIN B1: Thiamine: 213.1 nmol/L — ABNORMAL HIGH (ref 66.5–200.0)

## 2019-11-29 LAB — TSH: TSH: 0.945 u[IU]/mL (ref 0.450–4.500)

## 2019-11-29 LAB — RHEUMATOID FACTOR: Rhuematoid fact SerPl-aCnc: 32.6 IU/mL — ABNORMAL HIGH (ref 0.0–13.9)

## 2019-11-29 NOTE — Progress Notes (Signed)
Please call patient and advise her that her blood work shows a few abnormalities. The protein breakdown in her blood shows excess antibodies of one kind and this can cause nerve damage sometimes. I would value input with hematology and would like to make a referral to a specialist.  She also has elevated vitamin B1 and B6 and B12, she may be taking a supplement with these vitamins, sometimes too much vitamin B6 can cause neuropathy and I would favor that she stop taking any B vitamin supplementation.  One of her arthritis markers called rheumatoid factor is elevated, this can be seen in patients with rheumatoid arthritis and her inflammatory marker called C-reactive protein is also elevated which is a more nonspecific inflammation marker. I would like to make a referral to rheumatology for further evaluation.  Her muscle enzyme called CK is very mildly elevated. This is a nonspecific finding, can be seen after a fall. As discussed, we will proceed with electrical nerve and muscle testing in our office.   If she is agreeable, let me know if we can place referrals to hematology and rheumatology.

## 2019-11-30 ENCOUNTER — Telehealth: Payer: Self-pay

## 2019-11-30 NOTE — Telephone Encounter (Signed)
I returned the pt's call and we reviewed the lab results. Pt sts she is not opposed to the referrals but would like to wait until she talks with her daughter Delsa Sale) about the results and will get back with me on placing the referrals.

## 2019-11-30 NOTE — Telephone Encounter (Signed)
-----   Message from Star Age, MD sent at 11/29/2019  4:44 PM EDT ----- Please call patient and advise her that her blood work shows a few abnormalities. The protein breakdown in her blood shows excess antibodies of one kind and this can cause nerve damage sometimes. I would value input with hematology and would like to make a referral to a specialist.  She also has elevated vitamin B1 and B6 and B12, she may be taking a supplement with these vitamins, sometimes too much vitamin B6 can cause neuropathy and I would favor that she stop taking any B vitamin supplementation.  One of her arthritis markers called rheumatoid factor is elevated, this can be seen in patients with rheumatoid arthritis and her inflammatory marker called C-reactive protein is also elevated which is a more nonspecific inflammation marker. I would like to make a referral to rheumatology for further evaluation.  Her muscle enzyme called CK is very mildly elevated. This is a nonspecific finding, can be seen after a fall. As discussed, we will proceed with electrical nerve and muscle testing in our office.   If she is agreeable, let me know if we can place referrals to hematology and rheumatology.

## 2019-11-30 NOTE — Telephone Encounter (Signed)
Pt called back in regards to missed call

## 2019-11-30 NOTE — Telephone Encounter (Signed)
I called pt. No answer, left a message asking pt to call me back.   

## 2019-11-30 NOTE — Telephone Encounter (Signed)
Noted, thank you

## 2019-12-22 ENCOUNTER — Ambulatory Visit (INDEPENDENT_AMBULATORY_CARE_PROVIDER_SITE_OTHER): Payer: Medicare Other | Admitting: Diagnostic Neuroimaging

## 2019-12-22 ENCOUNTER — Encounter: Payer: Medicare Other | Admitting: Diagnostic Neuroimaging

## 2019-12-22 ENCOUNTER — Other Ambulatory Visit: Payer: Self-pay

## 2019-12-22 DIAGNOSIS — M79602 Pain in left arm: Secondary | ICD-10-CM

## 2019-12-22 DIAGNOSIS — M79605 Pain in left leg: Secondary | ICD-10-CM | POA: Diagnosis not present

## 2019-12-22 DIAGNOSIS — M79604 Pain in right leg: Secondary | ICD-10-CM | POA: Diagnosis not present

## 2019-12-22 DIAGNOSIS — M79601 Pain in right arm: Secondary | ICD-10-CM

## 2019-12-22 DIAGNOSIS — Z0289 Encounter for other administrative examinations: Secondary | ICD-10-CM

## 2019-12-30 NOTE — Progress Notes (Signed)
Please call and advise the patient that the recent EMG and nerve conduction velocity test, which is the electrical nerve and muscle test we we performed, showed evidence of mild neuropathy, ie nerve damage. As previously suggested, I would like to refer her to hematology and rheumatology.   Star Age, MD, PhD

## 2019-12-30 NOTE — Procedures (Signed)
[facility]  NCS (NERVE CONDUCTION STUDY) WITH EMG (ELECTROMYOGRAPHY) REPORT   STUDY DATE: 12/22/19 PATIENT NAME: Molly Welch DOB: 09-29-1945 MRN: [id number]  ORDERING CLINICIAN: Star Age, MD PhD   TECHNOLOGIST: Sherre Scarlet ELECTROMYOGRAPHER: Earlean Polka. Minette Manders, MD  CLINICAL INFORMATION: 74 year old female with bilateral foot numbness and left hand pain.  History of left wrist fracture.   FINDINGS: NERVE CONDUCTION STUDY:  Bilateral median, left ulnar, right peroneal, right tibial motor responses are normal.  Right sural and right ulnar sensory responses are normal.  Left ulnar, left median and right median sensory response are prolonged peak latencies.  Right superficial peroneal sensory spots has decreased amplitude.  Right tibial and left ulnar F wave latencies are normal.   NEEDLE ELECTROMYOGRAPHY:  Needle examination of left lower extremity is normal.   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     INTERPRETING PHYSICIAN:  Penni Bombard, MD Certified in Neurology, Neurophysiology and Neuroimaging  [facility] [address], [place], [place] [address] [phone]   [facility]    Nerve / Sites Muscle Latency Ref. Amplitude Ref. Rel Amp Segments Distance Velocity Ref. Area    ms ms mV mV %  cm m/s m/s mVms  L Median - APB     Wrist APB 4.3 ?4.4 8.5 ?4.0 100 Wrist - APB 7   38.7     Upper arm APB 8.2  8.4  98.1 Upper arm - Wrist 20 51 ?49 38.8  R Median - APB     Wrist APB 4.0 ?4.4 7.1 ?4.0 100 Wrist - APB 7   28.4     Upper arm APB 8.2  6.8  95.6 Upper arm - Wrist 21 50 ?49 28.9  L Ulnar - ADM     Wrist ADM 2.6 ?3.3 7.7 ?6.0 100 Wrist - ADM 7   35.8     B.Elbow ADM 6.3  7.6  99.1 B.Elbow - Wrist 18 49 ?49 34.9     A.Elbow ADM 8.3  7.5  98.5 A.Elbow - B.Elbow 10 49 ?49 33.6  R Peroneal - EDB     Ankle EDB 5.5 ?6.5 5.8 ?2.0 100 Ankle - EDB 9   20.7     Fib head EDB 11.3   5.0  85.4 Fib head - Ankle 26 45 ?44 19.4     Pop fossa EDB 13.4  4.7  94.2 Pop fossa - Fib head 10 47 ?44 18.3         Pop fossa - Ankle      R Tibial - AH     Ankle AH 4.0 ?5.8 10.8 ?4.0 100 Ankle - AH 9   21.9     Pop fossa AH 12.8  9.7  89.6 Pop fossa - Ankle 37 42 ?41 21.4                SNC    Nerve / Sites Rec. Site Peak Lat Ref.  Amp Ref. Segments Distance    ms ms V V  cm  R Sural - Ankle (Calf)     Calf Ankle 4.0 ?4.4 8 ?6 Calf - Ankle 14  R Superficial peroneal - Ankle     Lat leg Ankle 4.2 ?4.4 3 ?6 Lat leg - Ankle 14  L Median - Orthodromic (Dig II, Mid palm)     Dig II Wrist 4.1 ?3.4 13 ?10 Dig II - Wrist 13  R Median -  Orthodromic (Dig II, Mid palm)     Dig II Wrist 3.8 ?3.4 13 ?10 Dig II - Wrist 13  L Ulnar - Orthodromic, (Dig V, Mid palm)     Dig V Wrist 4.0 ?3.1 6 ?5 Dig V - Wrist 11  R Ulnar - Orthodromic, (Dig V, Mid palm)     Dig V Wrist 3.1 ?3.1 7 ?5 Dig V - Wrist 49                 F  Wave    Nerve F Lat Ref.   ms ms  R Tibial - AH 47.2 ?56.0  L Ulnar - ADM 30.9 ?32.0         EMG Summary Table    Spontaneous MUAP Recruitment  Muscle IA Fib PSW Fasc Other Amp Dur. Poly Pattern  L. Vastus medialis Normal None None None _______ Normal Normal Normal Normal  L. Tibialis anterior Normal None None None _______ Normal Normal Normal Normal  L. Gastrocnemius (Medial head) Normal None None None _______ Normal Normal Normal Normal

## 2020-01-02 ENCOUNTER — Telehealth: Payer: Self-pay

## 2020-01-02 DIAGNOSIS — G6289 Other specified polyneuropathies: Secondary | ICD-10-CM

## 2020-01-02 DIAGNOSIS — R768 Other specified abnormal immunological findings in serum: Secondary | ICD-10-CM

## 2020-01-02 DIAGNOSIS — D472 Monoclonal gammopathy: Secondary | ICD-10-CM

## 2020-01-02 DIAGNOSIS — R7689 Other specified abnormal immunological findings in serum: Secondary | ICD-10-CM

## 2020-01-02 NOTE — Telephone Encounter (Signed)
I called pt. No answer, left a message asking pt to call me back.   

## 2020-01-02 NOTE — Telephone Encounter (Signed)
Pt has been asked to call and schedule f/u

## 2020-01-02 NOTE — Telephone Encounter (Signed)
Referrals placed. Please also assist with FU in our clinic in about 2-3 months, ok to see NP.

## 2020-01-02 NOTE — Telephone Encounter (Signed)
-----   Message from Star Age, MD sent at 12/30/2019  2:13 PM EDT ----- Please call and advise the patient that the recent EMG and nerve conduction velocity test, which is the electrical nerve and muscle test we we performed, showed evidence of mild neuropathy, ie nerve damage. As previously suggested, I would like to refer her to hematology and rheumatology.   Star Age, MD, PhD

## 2020-01-02 NOTE — Addendum Note (Signed)
Addended by: Star Age on: 01/02/2020 04:42 PM   Modules accepted: Orders

## 2020-01-02 NOTE — Telephone Encounter (Signed)
Pt returned my call and we discussed results. Pt verbalized understanding.   On 11/30/2019 when I spoke with the pt about her her labs she sated he would like to talk with her daughter before agreeing to referral to Hematology and Rheumatology. Pt and I discussed these referrals again to day and she is agreeable to referrals being placed.

## 2020-01-10 ENCOUNTER — Telehealth: Payer: Self-pay | Admitting: Hematology and Oncology

## 2020-01-10 NOTE — Telephone Encounter (Signed)
Pt was referred to Rheumatology, could not book an appt untill 11/18.. Pt would like to know if she can be referred to another Rheumatologist office available soon. Shanda Howells.)

## 2020-01-10 NOTE — Telephone Encounter (Signed)
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 Pt as been cld and scheduled to see Dr. Lorenso Courier on 7/22 at 2pm. Pt aware to arrive 15 minutes early.

## 2020-01-12 ENCOUNTER — Encounter: Payer: Self-pay | Admitting: Neurology

## 2020-01-12 NOTE — Telephone Encounter (Signed)
Sent to [facility] Telephone [phone] - fax [phone] - I asked patient if this does not work would she be willing to travel patient relayed no.

## 2020-01-25 NOTE — Progress Notes (Signed)
[facility] [phone]   Fax:([phone] [place] NOTE  Patient Care Team: Carolee Rota, NP as PCP - General (Nurse Practitioner)  Hematological/Oncological History # Monoclonal Gammopathy 1) 11/24/2019: SPEP showed an M protein 0.1 with IgG Kappa specificity.  2) 01/26/2020: establish care with Dr. Lorenso Courier   CHIEF COMPLAINTS/PURPOSE OF CONSULTATION:  "Monoclonal Gammopathy "  HISTORY OF PRESENTING ILLNESS:  Molly Welch 74 y.o. female with medical history significant for COPD, anxiety/depression, and tobacco dependence who presents for evaluation of a monoclonal gammopathy.   On review of the previous records Molly Welch had labs drawn on 11/24/2019 which showed an SPEP with an M protein of 0.1 with IgG kappa specificity.  Due to concern for this finding the patient was referred to hematology for further evaluation and management.  Of note she had no labs concerning for anemia, or chronic kidney disease at the time of the SPEP being drawn.  On exam today Molly Welch notes that she recently had issues with being bit by a tick and that was pulled out of her skin.  She also notes that she recently received the shingles vaccine.  On further discussion she notes that she is not entirely sure why she is here today.  She reports that she has had no issues with fevers, chills, sweats, nausea, vomiting or diarrhea.  She denies having any new bone or back pain.  She denies any changes in her urinary habits that is increased frequency or bubbling on urination.  She is an active half pack per day smoker and has been doing this since at least the age of 24.  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  She is otherwise quite healthy and has no complaints or concerns today.  A full 10 point ROS is listed below.  MEDICAL HISTORY:  Past Medical History:  Diagnosis Date  . Abnormal chest x-ray 2005   COPD  changes (2005) + interstitial lung dz changes (2002)  . Anxiety and depression    Prozac, celexa, paxil, wellbutrin, and buspar all failures.  Pt has stated xanax is only med that helps.  . Tobacco dependence     SURGICAL HISTORY: Past Surgical History:  Procedure Laterality Date  . ABDOMINAL HYSTERECTOMY  1979  . APPENDECTOMY  1966    SOCIAL HISTORY: Social History   Socioeconomic History  . Marital status: Single    Spouse name: Not on file  . Number of children: Not on file  . Years of education: Not on file  . Highest education level: Not on file  Occupational History  . Not on file  Tobacco Use  . Smoking status: Current Every Day Smoker    Packs/day: 0.50    Years: 55.00    Pack years: 27.50  . Smokeless tobacco: Never Used  Substance and Sexual Activity  . Alcohol use: No  . Drug use: No  . Sexual activity: Not on file  Other Topics Concern  . Not on file  Social History Narrative  . Not on file   Social Determinants of Health   Financial Resource Strain:   . Difficulty of Paying Living Expenses:   Food Insecurity:   . Worried About Charity fundraiser in the Last Year:   . Arboriculturist in the Last Year:   Transportation Needs:   . Film/video editor (Medical):   Marland Kitchen Lack of Transportation (Non-Medical):  Physical Activity:   . Days of Exercise per Week:   . Minutes of Exercise per Session:   Stress:   . Feeling of Stress :   Social Connections:   . Frequency of Communication with Friends and Family:   . Frequency of Social Gatherings with Friends and Family:   . Attends Religious Services:   . Active Member of Clubs or Organizations:   . Attends Archivist Meetings:   Marland Kitchen Marital Status:   Intimate Partner Violence:   . Fear of Current or Ex-Partner:   . Emotionally Abused:   Marland Kitchen Physically Abused:   . Sexually Abused:     FAMILY HISTORY: Family History  Problem Relation Age of Onset  . Diabetes Other   . Suicidality Father    . Heart disease Brother     ALLERGIES:  is allergic to doxycycline, prednisone, penicillins, and sulfonamide derivatives.  MEDICATIONS:  Current Outpatient Medications  Medication Sig Dispense Refill  . valACYclovir (VALTREX) 500 MG tablet Take 500 mg by mouth daily.    Marland Kitchen ALPRAZolam (XANAX) 0.5 MG tablet Take 0.5 mg by mouth 3 (three) times daily as needed for anxiety.     . Amylase-Lipase-Protease (CREON 10 PO) Take by mouth.    Marland Kitchen CALCIUM PO Take 1 tablet by mouth every morning.    . gabapentin (NEURONTIN) 100 MG capsule Take 100 mg by mouth.    . lamoTRIgine (LAMICTAL) 25 MG tablet Take 25 mg by mouth every morning.    . Multiple Vitamins-Minerals (CENTRUM SILVER 50+WOMEN) TABS Take 1 tablet by mouth every morning.    . vitamin C (ASCORBIC ACID) 500 MG tablet Take 1,000 mg by mouth every morning.     No current facility-administered medications for this visit.    REVIEW OF SYSTEMS:   Constitutional: ( - ) fevers, ( - )  chills , ( - ) night sweats Eyes: ( - ) blurriness of vision, ( - ) double vision, ( - ) watery eyes Ears, nose, mouth, throat, and face: ( - ) mucositis, ( - ) sore throat Respiratory: ( - ) cough, ( - ) dyspnea, ( - ) wheezes Cardiovascular: ( - ) palpitation, ( - ) chest discomfort, ( - ) lower extremity swelling Gastrointestinal:  ( - ) nausea, ( - ) heartburn, ( - ) change in bowel habits Skin: ( - ) abnormal skin rashes Lymphatics: ( - ) new lymphadenopathy, ( - ) easy bruising Neurological: ( - ) numbness, ( - ) tingling, ( - ) new weaknesses Behavioral/Psych: ( - ) mood change, ( - ) new changes  All other systems were reviewed with the patient and are negative.  PHYSICAL EXAMINATION: ECOG PERFORMANCE STATUS: 0 - Asymptomatic  Vitals:   01/26/20 1353  BP: (!) 141/74  Pulse: 80  Resp: 18  Temp: 98.2 F (36.8 C)  SpO2: 100%   Filed Weights   01/26/20 1353  Weight: 110 lb 12.8 oz (50.3 kg)    GENERAL: well appearing elderly Caucasian female  in NAD  SKIN: skin color, texture, turgor are normal, no rashes or significant lesions EYES: conjunctiva are pink and non-injected, sclera clear LUNGS: clear to auscultation and percussion with normal breathing effort HEART: regular rate & rhythm and no murmurs and no lower extremity edema Musculoskeletal: no cyanosis of digits and no clubbing  PSYCH: alert & oriented x 3, fluent speech NEURO: no focal motor/sensory deficits  LABORATORY DATA:  I have reviewed the data as listed CBC Latest Ref  Rng & Units 01/26/2020 05/12/2016  WBC 4.0 - 10.5 K/uL 8.1 12.0(H)  Hemoglobin 12.0 - 15.0 g/dL 13.0 13.1  Hematocrit 36 - 46 % 40.5 39.2  Platelets 150 - 400 K/uL 329 342    CMP Latest Ref Rng & Units 11/24/2019 05/12/2016  Glucose 65 - 99 mg/dL 83 111(H)  BUN 8 - 27 mg/dL 13 13  Creatinine 0.57 - 1.00 mg/dL 0.70 0.95  Sodium 134 - 144 mmol/L 139 138  Potassium 3.5 - 5.2 mmol/L 4.4 4.0  Chloride 96 - 106 mmol/L 101 105  CO2 20 - 29 mmol/L 25 28  Calcium 8.7 - 10.3 mg/dL 9.7 9.3  Total Protein 6.0 - 8.5 g/dL 7.1 -  Total Bilirubin 0.0 - 1.2 mg/dL 0.2 -  Alkaline Phos 48 - 121 IU/L 79 -  AST 0 - 40 IU/L 26 -  ALT 0 - 32 IU/L 12 -    RADIOGRAPHIC STUDIES: I have personally reviewed the radiological images as listed and agreed with the findings in the report. No results found.  ASSESSMENT & PLAN Molly Welch 74 y.o. female with medical history significant for COPD, anxiety/depression, and tobacco dependence who presents for evaluation of a monoclonal gammopathy.  To review the labs, discussion with the patient, and reviewed the outside records the findings are most consistent with a monoclonal gammopathy.  The M protein is only mildly elevated at 0.1 and his IgG G kappa specificity.  Given these findings there is no indication for a bone marrow biopsy at this time, however this may change with further evaluation.  In order to complete the work-up I would recommend that we order an SPEP, UPEP,  serum free light chains, beta-2 microglobulin, LDH, and a DG metastatic survey.  In the event the patient is not found to have any other risk factors for more aggressive disease we will continue to monitor her with a repeat visit in 6 months time and if she has continued stability, every 12 months thereafter.  # Monoclonal Gammopathy --today will repeat SPEP and additionally will order SFLC, beta 2 microglobulin, LDH, UPEP, and metastatic survey --no evidence of anemia or kidney dysfunction.  --no clear indication for a bone marrow biopsy at this time, however this may change pending the above studies --recommend routine f/u in 6 months time. If M protein remains low can consider q 12 month f/u.   Orders Placed This Encounter  Procedures  . DG Bone Survey Met    Standing Status:   Future    Standing Expiration Date:   01/25/2021    Order Specific Question:   Reason for Exam (SYMPTOM  OR DIAGNOSIS REQUIRED)    Answer:   MGUS, assess for lytic lesions.    Order Specific Question:   Preferred imaging location?    Answer:   [facility]    Order Specific Question:   Radiology Contrast Protocol - do NOT remove file path    Answer:   _[email or web address]  . CBC with Differential ([place] Only)    Standing Status:   Future    Number of Occurrences:   1    Standing Expiration Date:   01/25/2021  . Lactate dehydrogenase (LDH)    Standing Status:   Future    Number of Occurrences:   1    Standing Expiration Date:   01/25/2021  . CMP ([place] only)    Standing Status:   Future    Number of Occurrences:   1  Standing Expiration Date:   01/25/2021  . Kappa/lambda light chains    Standing Status:   Future    Number of Occurrences:   1    Standing Expiration Date:   01/25/2021  . Multiple Myeloma Panel (SPEP&IFE w/QIG)    Standing Status:   Future    Number of Occurrences:   1    Standing Expiration Date:   01/25/2021  . Beta 2  microglobulin    Standing Status:   Future    Number of Occurrences:   1    Standing Expiration Date:   01/25/2021  . 24-Hr Ur UPEP/UIFE/Light Chains/TP    Standing Status:   Future    Standing Expiration Date:   01/25/2021    All questions were answered. The patient knows to call the clinic with any problems, questions or concerns.  A total of more than 45 minutes were spent on this encounter and over half of that time was spent on counseling and coordination of care as outlined above.   Ledell Peoples, MD Department of Hematology/Oncology [place] at [facility] Phone: [phone] Pager: [phone] Email: [email or web address]  01/26/2020 3:24 PM

## 2020-01-26 ENCOUNTER — Inpatient Hospital Stay: Payer: Medicare Other | Attending: Hematology and Oncology | Admitting: Hematology and Oncology

## 2020-01-26 ENCOUNTER — Other Ambulatory Visit: Payer: Self-pay

## 2020-01-26 ENCOUNTER — Encounter: Payer: Self-pay | Admitting: Hematology and Oncology

## 2020-01-26 ENCOUNTER — Inpatient Hospital Stay: Payer: Medicare Other

## 2020-01-26 VITALS — BP 141/74 | HR 80 | Temp 98.2°F | Resp 18 | Ht 62.0 in | Wt 110.8 lb

## 2020-01-26 DIAGNOSIS — D472 Monoclonal gammopathy: Secondary | ICD-10-CM

## 2020-01-26 DIAGNOSIS — F17210 Nicotine dependence, cigarettes, uncomplicated: Secondary | ICD-10-CM | POA: Insufficient documentation

## 2020-01-26 DIAGNOSIS — J449 Chronic obstructive pulmonary disease, unspecified: Secondary | ICD-10-CM | POA: Insufficient documentation

## 2020-01-26 DIAGNOSIS — Z79899 Other long term (current) drug therapy: Secondary | ICD-10-CM

## 2020-01-26 DIAGNOSIS — F419 Anxiety disorder, unspecified: Secondary | ICD-10-CM | POA: Diagnosis not present

## 2020-01-26 DIAGNOSIS — F329 Major depressive disorder, single episode, unspecified: Secondary | ICD-10-CM | POA: Diagnosis not present

## 2020-01-26 LAB — CBC WITH DIFFERENTIAL (CANCER CENTER ONLY)
Abs Immature Granulocytes: 0.02 10*3/uL (ref 0.00–0.07)
Basophils Absolute: 0.1 10*3/uL (ref 0.0–0.1)
Basophils Relative: 1 %
Eosinophils Absolute: 0.3 10*3/uL (ref 0.0–0.5)
Eosinophils Relative: 4 %
HCT: 40.5 % (ref 36.0–46.0)
Hemoglobin: 13 g/dL (ref 12.0–15.0)
Immature Granulocytes: 0 %
Lymphocytes Relative: 36 %
Lymphs Abs: 2.9 10*3/uL (ref 0.7–4.0)
MCH: 30.1 pg (ref 26.0–34.0)
MCHC: 32.1 g/dL (ref 30.0–36.0)
MCV: 93.8 fL (ref 80.0–100.0)
Monocytes Absolute: 0.7 10*3/uL (ref 0.1–1.0)
Monocytes Relative: 9 %
Neutro Abs: 4.1 10*3/uL (ref 1.7–7.7)
Neutrophils Relative %: 50 %
Platelet Count: 329 10*3/uL (ref 150–400)
RBC: 4.32 MIL/uL (ref 3.87–5.11)
RDW: 13.2 % (ref 11.5–15.5)
WBC Count: 8.1 10*3/uL (ref 4.0–10.5)
nRBC: 0 % (ref 0.0–0.2)

## 2020-01-26 LAB — CMP (CANCER CENTER ONLY)
ALT: 16 U/L (ref 0–44)
AST: 24 U/L (ref 15–41)
Albumin: 4 g/dL (ref 3.5–5.0)
Alkaline Phosphatase: 66 U/L (ref 38–126)
Anion gap: 8 (ref 5–15)
BUN: 15 mg/dL (ref 8–23)
CO2: 31 mmol/L (ref 22–32)
Calcium: 9.3 mg/dL (ref 8.9–10.3)
Chloride: 101 mmol/L (ref 98–111)
Creatinine: 0.62 mg/dL (ref 0.44–1.00)
GFR, Est AFR Am: >60 mL/min (ref >60)
GFR, Estimated: >60 mL/min (ref >60)
Glucose, Bld: 85 mg/dL (ref 70–99)
Potassium: 3.6 mmol/L (ref 3.5–5.1)
Sodium: 140 mmol/L (ref 135–145)
Total Bilirubin: 0.5 mg/dL (ref 0.3–1.2)
Total Protein: 7.5 g/dL (ref 6.5–8.1)

## 2020-01-27 ENCOUNTER — Telehealth: Payer: Self-pay | Admitting: Hematology and Oncology

## 2020-01-27 LAB — MULTIPLE MYELOMA PANEL, SERUM
Albumin SerPl Elph-Mcnc: 3.7 g/dL (ref 2.9–4.4)
Albumin/Glob SerPl: 1.2 (ref 0.7–1.7)
Alpha 1: 0.2 g/dL (ref 0.0–0.4)
Alpha2 Glob SerPl Elph-Mcnc: 0.9 g/dL (ref 0.4–1.0)
B-Globulin SerPl Elph-Mcnc: 0.9 g/dL (ref 0.7–1.3)
Gamma Glob SerPl Elph-Mcnc: 1 g/dL (ref 0.4–1.8)
Globulin, Total: 3.1 g/dL (ref 2.2–3.9)
IgA: 189 mg/dL (ref 64–422)
IgG (Immunoglobin G), Serum: 1022 mg/dL (ref 586–1602)
IgM (Immunoglobulin M), Srm: 165 mg/dL (ref 26–217)
M Protein SerPl Elph-Mcnc: 0.1 g/dL — ABNORMAL HIGH
Total Protein ELP: 6.8 g/dL (ref 6.0–8.5)

## 2020-01-27 LAB — KAPPA/LAMBDA LIGHT CHAINS
Kappa free light chain: 37.7 mg/L — ABNORMAL HIGH (ref 3.3–19.4)
Kappa, lambda light chain ratio: 1.5 (ref 0.26–1.65)
Lambda free light chains: 25.2 mg/L (ref 5.7–26.3)

## 2020-01-27 LAB — LACTATE DEHYDROGENASE: LDH: 208 U/L — ABNORMAL HIGH (ref 98–192)

## 2020-01-27 LAB — BETA 2 MICROGLOBULIN, SERUM: Beta-2 Microglobulin: 1.9 mg/L (ref 0.6–2.4)

## 2020-01-27 NOTE — Telephone Encounter (Signed)
Called patient to schedule f/u appt per 7/22 los. Patient will call back to schedule when she gets home.

## 2020-01-30 ENCOUNTER — Other Ambulatory Visit: Payer: Self-pay

## 2020-01-30 DIAGNOSIS — D472 Monoclonal gammopathy: Secondary | ICD-10-CM | POA: Diagnosis not present

## 2020-01-31 LAB — UPEP/UIFE/LIGHT CHAINS/TP, 24-HR UR
% BETA, Urine: 0 %
ALPHA 1 URINE: 0 %
Albumin, U: 100 %
Alpha 2, Urine: 0 %
Free Kappa Lt Chains,Ur: 16.14 mg/L (ref 0.63–113.79)
Free Kappa/Lambda Ratio: 11.37 (ref 1.03–31.76)
Free Lambda Lt Chains,Ur: 1.42 mg/L (ref 0.47–11.77)
GAMMA GLOBULIN URINE: 0 %
Total Protein, Urine-Ur/day: 83 mg/24 hr (ref 30–150)
Total Protein, Urine: 5.7 mg/dL
Total Volume: 1450

## 2020-02-02 ENCOUNTER — Telehealth: Payer: Self-pay | Admitting: Hematology and Oncology

## 2020-02-02 ENCOUNTER — Other Ambulatory Visit: Payer: Self-pay | Admitting: *Deleted

## 2020-02-02 ENCOUNTER — Telehealth: Payer: Self-pay | Admitting: *Deleted

## 2020-02-02 NOTE — Telephone Encounter (Signed)
Called pt per 7/28 sch msg :  Scheduling Message Entered by Narda Rutherford T on 02/01/2020 at 5:27 PM Priority: Routine EST PT 30  Department: [facility]  Provider: Orson Slick, MD  Scheduling Notes:  Please schedule Molly Welch for a est patient f/u in 6 months time with labs before the visit.    Per pt- does not want to schedule 6 month f/u at the moment. Will set one up at a later date.

## 2020-02-02 NOTE — Telephone Encounter (Signed)
TCT patient regarding results of her 24 hour urine. Spoke with patient. Explained that the 24 hour urine showed no abnormalities. 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 Advised that this is a condition that she will be followed for every 6 month here at the Cancer center. Assured her that this is a not a cancer but bears watching.  She has not had her bone survey done yet. Provided her with the phone # for central radiology scheduling to have this scheduled. She voiced understanding and stated she will call toget this scheduled.

## 2020-02-02 NOTE — Telephone Encounter (Signed)
-----   Message from Orson Slick, MD sent at 02/01/2020  5:27 PM EDT ----- Please let Mrs. Barretta know that her urine studies showed no abnormalities. 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  ----- Message ----- From: Buel Ream, Lab In [place] Sent: 01/31/2020   4:38 PM EDT To: Orson Slick, MD

## 2020-02-08 ENCOUNTER — Ambulatory Visit (HOSPITAL_COMMUNITY)
Admission: RE | Admit: 2020-02-08 | Discharge: 2020-02-08 | Disposition: A | Discharge status: Home / Self Care | Payer: Medicare Other | Source: Ambulatory Visit | Attending: Hematology and Oncology | Admitting: Hematology and Oncology

## 2020-02-08 ENCOUNTER — Other Ambulatory Visit: Payer: Self-pay

## 2020-02-08 DIAGNOSIS — D472 Monoclonal gammopathy: Secondary | ICD-10-CM | POA: Insufficient documentation

## 2020-02-13 ENCOUNTER — Telehealth: Payer: Self-pay

## 2020-02-13 ENCOUNTER — Telehealth: Payer: Self-pay | Admitting: Hematology and Oncology

## 2020-02-13 NOTE — Telephone Encounter (Signed)
Called pt to schedule her for BMBX per Dr Lorenso Courier and Wilber Bihari, NP. Pt requests 8/18 as she needs time to plan to be out of work. Pt understands she should arrive at [place] and she will have bx then labs.

## 2020-02-13 NOTE — Telephone Encounter (Signed)
Called Ms. Opiela to discuss the results of her blood work and metastatic survey.  Blood work appeared quite stable with a monoclonal protein of 0.1 IgG as well as normal serum free light chain ratio.  She had normal hemoglobin as well as normal creatinine.  Unfortunately her metastatic survey showed a lytic lesion on her right humerus.  As such gold standard recommendation would be for bone marrow biopsy.  I called the patient and discussed the procedure with her and she was willing and able to proceed with bone marrow biopsy.  We will get this scheduled as soon as is feasible.  Ledell Peoples, MD Department of Hematology/Oncology [place] at [facility] Phone: [phone] Pager: [phone] Email: [email or web address]

## 2020-02-20 ENCOUNTER — Other Ambulatory Visit: Payer: Self-pay | Admitting: *Deleted

## 2020-02-20 DIAGNOSIS — D472 Monoclonal gammopathy: Secondary | ICD-10-CM

## 2020-02-22 ENCOUNTER — Other Ambulatory Visit: Payer: Self-pay

## 2020-02-22 ENCOUNTER — Inpatient Hospital Stay: Payer: Medicare Other

## 2020-02-22 ENCOUNTER — Inpatient Hospital Stay: Payer: Medicare Other | Attending: Hematology and Oncology | Admitting: Adult Health

## 2020-02-22 VITALS — BP 137/85 | HR 70 | Temp 98.3°F | Resp 16

## 2020-02-22 DIAGNOSIS — D472 Monoclonal gammopathy: Secondary | ICD-10-CM | POA: Diagnosis present

## 2020-02-22 LAB — CBC WITH DIFFERENTIAL (CANCER CENTER ONLY)
Abs Immature Granulocytes: 0.02 10*3/uL (ref 0.00–0.07)
Basophils Absolute: 0.1 10*3/uL (ref 0.0–0.1)
Basophils Relative: 1 %
Eosinophils Absolute: 0.1 10*3/uL (ref 0.0–0.5)
Eosinophils Relative: 1 %
HCT: 39 % (ref 36.0–46.0)
Hemoglobin: 12.8 g/dL (ref 12.0–15.0)
Immature Granulocytes: 0 %
Lymphocytes Relative: 28 %
Lymphs Abs: 2.3 10*3/uL (ref 0.7–4.0)
MCH: 30.5 pg (ref 26.0–34.0)
MCHC: 32.8 g/dL (ref 30.0–36.0)
MCV: 92.9 fL (ref 80.0–100.0)
Monocytes Absolute: 0.7 10*3/uL (ref 0.1–1.0)
Monocytes Relative: 9 %
Neutro Abs: 5.1 10*3/uL (ref 1.7–7.7)
Neutrophils Relative %: 61 %
Platelet Count: 305 10*3/uL (ref 150–400)
RBC: 4.2 MIL/uL (ref 3.87–5.11)
RDW: 12.9 % (ref 11.5–15.5)
WBC Count: 8.3 10*3/uL (ref 4.0–10.5)
nRBC: 0 % (ref 0.0–0.2)

## 2020-02-22 MED ORDER — LIDOCAINE HCL 2 % IJ SOLN
INTRAMUSCULAR | Status: AC
  Filled 2020-02-22: qty 20

## 2020-02-22 NOTE — Patient Instructions (Signed)
Bone Marrow Aspiration and Bone Marrow Biopsy, Adult, Care After This sheet gives you information about how to care for yourself after your procedure. Your health care provider may also give you more specific instructions. If you have problems or questions, contact your health care provider. What can I expect after the procedure? After the procedure, it is common to have:  Mild pain and tenderness.  Swelling.  Bruising. Follow these instructions at home: Puncture site care   Follow instructions from your health care provider about how to take care of the puncture site. Make sure you: ? Wash your hands with soap and water before and after you change your bandage (dressing). If soap and water are not available, use hand sanitizer. ? Change your dressing as told by your health care provider.  Check your puncture site every day for signs of infection. Check for: ? More redness, swelling, or pain. ? Fluid or blood. ? Warmth. ? Pus or a bad smell. Activity  Return to your normal activities as told by your health care provider. Ask your health care provider what activities are safe for you.  Do not lift anything that is heavier than 10 lb (4.5 kg), or the limit that you are told, until your health care provider says that it is safe.  Do not drive for 24 hours if you were given a sedative during your procedure. General instructions   Take over-the-counter and prescription medicines only as told by your health care provider.  Do not take baths, swim, or use a hot tub until your health care provider approves. Ask your health care provider if you may take showers. You may only be allowed to take sponge baths.  If directed, put ice on the affected area. To do this: ? Put ice in a plastic bag. ? Place a towel between your skin and the bag. ? Leave the ice on for 20 minutes, 2-3 times a day.  Keep all follow-up visits as told by your health care provider. This is important. Contact a  health care provider if:  Your pain is not controlled with medicine.  You have a fever.  You have more redness, swelling, or pain around the puncture site.  You have fluid or blood coming from the puncture site.  Your puncture site feels warm to the touch.  You have pus or a bad smell coming from the puncture site. Summary  After the procedure, it is common to have mild pain, tenderness, swelling, and bruising.  Follow instructions from your health care provider about how to take care of the puncture site and what activities are safe for you.  Take over-the-counter and prescription medicines only as told by your health care provider.  Contact a health care provider if you have any signs of infection, such as fluid or blood coming from the puncture site. This information is not intended to replace advice given to you by your health care provider. Make sure you discuss any questions you have with your health care provider. Document Revised: 11/09/2018 Document Reviewed: 11/09/2018 Elsevier Patient Education  2020 Elsevier Inc.  

## 2020-02-22 NOTE — Progress Notes (Signed)
INDICATION: MGUS  Brief examination was performed. ENT: adequate airway clearance Heart: regular rate and rhythm.No Murmurs Lungs: clear to auscultation, no wheezes, normal respiratory effort  Bone Marrow Biopsy and Aspiration Procedure Note   Informed consent was obtained and potential risks including bleeding, infection and pain were reviewed with the patient.  The patient's name, date of birth, identification, consent and allergies were verified prior to the start of procedure and time out was performed.  The left posterior iliac crest was chosen as the site of biopsy.  The skin was prepped with ChloraPrep.   8 cc of 2% lidocaine was used to provide local anaesthesia.   10 cc of bone marrow aspirate was obtained followed by 1cm biopsy.  Pressure was applied to the biopsy site and bandage was placed over the biopsy site. Patient was made to lie on the back for 30 mins prior to discharge.  The procedure was tolerated well. COMPLICATIONS: None BLOOD LOSS: none The patient was discharged home in stable condition with a 1 week follow up to review results.  Patient was provided with post bone marrow biopsy instructions and instructed to call if there was any bleeding or worsening pain.  Specimens sent for flow cytometry, cytogenetics and additional studies.  Signed Levada Bowersox C Quantavius Humm, NP   

## 2020-02-22 NOTE — Progress Notes (Signed)
Bone marrow biopsy/aspiration performed by Wilber Bihari, NP. Patient tolerated procedure well. Procedural site hemostasis achieved with direct pressure only. Sterile dressing applied to site and patient assisted to supine position with towel roll under site. At time of discharge, dressing clean/dry/intact and patient had no complaints. Patient ambulated to lobby.

## 2020-02-24 LAB — SURGICAL PATHOLOGY

## 2020-03-01 ENCOUNTER — Encounter (HOSPITAL_COMMUNITY): Payer: Self-pay | Admitting: Hematology and Oncology

## 2020-03-02 ENCOUNTER — Encounter (HOSPITAL_COMMUNITY): Payer: Self-pay | Admitting: Hematology and Oncology

## 2020-03-05 ENCOUNTER — Telehealth: Payer: Self-pay | Admitting: *Deleted

## 2020-03-05 NOTE — Telephone Encounter (Signed)
-----  Message from Orson Slick, MD sent at 03/05/2020  8:21 AM EDT ----- Please let Molly Welch know that her bone marrow biopsy results are most consistent with MGUS and not multiple myeloma. We will follow up with her in about 6 months to reassess. ----- Message ----- From: Interface, [facility] [place] Sent: 02/24/2020   9:34 AM EDT To: Orson Slick, MD

## 2020-03-05 NOTE — Telephone Encounter (Signed)
TCT patient and spoke with patient regarding results of her bone marrow biopsy.  Advised that she did not have multiple myeloma, but confirmed MGUS. Reviewed with her what MGUS means. She voiced understanding. Advised that she will need to follow up in this clinic every 6 months for labs and to see Dr. Dorsey. She voiced understanding and knows to call to make her appt for February of 2022. 

## 2020-03-07 ENCOUNTER — Telehealth: Payer: Self-pay | Admitting: Hematology and Oncology

## 2020-03-07 NOTE — Telephone Encounter (Signed)
Scheduled appt per 8/30 sch msg - no answer . Left message for patient with appt date and time . Also sent reminder letter in the mail.

## 2020-06-22 ENCOUNTER — Telehealth: Payer: Self-pay | Admitting: Hematology and Oncology

## 2020-06-22 NOTE — Telephone Encounter (Signed)
R/s 08/30/20 appt per provider on call schedule. Called and left msg. Mailed printout

## 2020-08-06 DIAGNOSIS — Z0000 Encounter for general adult medical examination without abnormal findings: Secondary | ICD-10-CM | POA: Diagnosis not present

## 2020-08-06 DIAGNOSIS — Z131 Encounter for screening for diabetes mellitus: Secondary | ICD-10-CM | POA: Diagnosis not present

## 2020-08-06 DIAGNOSIS — Z136 Encounter for screening for cardiovascular disorders: Secondary | ICD-10-CM | POA: Diagnosis not present

## 2020-08-28 ENCOUNTER — Telehealth: Payer: Self-pay | Admitting: Hematology and Oncology

## 2020-08-28 NOTE — Telephone Encounter (Signed)
Called pt per 2/22 sch msg - no answer . Left message for pt to call back to reschedule appt.

## 2020-08-30 ENCOUNTER — Ambulatory Visit: Payer: Medicare Other | Admitting: Hematology and Oncology

## 2020-08-30 ENCOUNTER — Other Ambulatory Visit: Payer: Medicare Other

## 2020-08-31 ENCOUNTER — Ambulatory Visit: Payer: Medicare Other | Admitting: Hematology and Oncology

## 2020-08-31 ENCOUNTER — Other Ambulatory Visit: Payer: Medicare Other

## 2020-09-12 ENCOUNTER — Telehealth: Payer: Self-pay | Admitting: Hematology and Oncology

## 2020-09-12 NOTE — Telephone Encounter (Signed)
Called pt per 3/7 sch msg :    Scheduling Message Entered by Narda Rutherford T on 09/10/2020 at 10:38 AM Priority: Routine EST PT 30  Department: [facility]  Provider:   Scheduling Notes:  Please schedule for a f/u visit with labs before in 3-4 weeks.    Per patient , she will call back when she is in front the calender.

## 2020-09-26 ENCOUNTER — Telehealth: Payer: Self-pay | Admitting: Hematology and Oncology

## 2020-09-26 NOTE — Telephone Encounter (Signed)
Scheduled per 03/21 scheduled message, patient has been called and notified.

## 2020-10-01 DIAGNOSIS — M13842 Other specified arthritis, left hand: Secondary | ICD-10-CM | POA: Diagnosis not present

## 2020-10-01 DIAGNOSIS — M25832 Other specified joint disorders, left wrist: Secondary | ICD-10-CM | POA: Diagnosis not present

## 2020-10-05 ENCOUNTER — Ambulatory Visit: Payer: Medicare Other | Admitting: Hematology and Oncology

## 2020-10-05 ENCOUNTER — Inpatient Hospital Stay: Payer: Medicare Other

## 2020-10-05 ENCOUNTER — Telehealth: Payer: Self-pay | Admitting: *Deleted

## 2020-10-05 ENCOUNTER — Inpatient Hospital Stay: Payer: Medicare Other | Attending: Hematology and Oncology | Admitting: Hematology and Oncology

## 2020-10-05 ENCOUNTER — Encounter: Payer: Self-pay | Admitting: Hematology and Oncology

## 2020-10-05 ENCOUNTER — Other Ambulatory Visit: Payer: Self-pay

## 2020-10-05 ENCOUNTER — Other Ambulatory Visit: Payer: Self-pay | Admitting: Hematology and Oncology

## 2020-10-05 VITALS — BP 138/77 | HR 65 | Temp 98.0°F | Resp 15 | Ht 62.0 in | Wt 113.9 lb

## 2020-10-05 DIAGNOSIS — G629 Polyneuropathy, unspecified: Secondary | ICD-10-CM | POA: Diagnosis not present

## 2020-10-05 DIAGNOSIS — D472 Monoclonal gammopathy: Secondary | ICD-10-CM

## 2020-10-05 DIAGNOSIS — Z79899 Other long term (current) drug therapy: Secondary | ICD-10-CM | POA: Insufficient documentation

## 2020-10-05 DIAGNOSIS — R531 Weakness: Secondary | ICD-10-CM | POA: Diagnosis not present

## 2020-10-05 DIAGNOSIS — F17210 Nicotine dependence, cigarettes, uncomplicated: Secondary | ICD-10-CM | POA: Insufficient documentation

## 2020-10-05 DIAGNOSIS — G252 Other specified forms of tremor: Secondary | ICD-10-CM | POA: Diagnosis not present

## 2020-10-05 LAB — CBC WITH DIFFERENTIAL (CANCER CENTER ONLY)
Abs Immature Granulocytes: 0.03 10*3/uL (ref 0.00–0.07)
Basophils Absolute: 0.1 10*3/uL (ref 0.0–0.1)
Basophils Relative: 1 %
Eosinophils Absolute: 0.1 10*3/uL (ref 0.0–0.5)
Eosinophils Relative: 1 %
HCT: 38.9 % (ref 36.0–46.0)
Hemoglobin: 12.4 g/dL (ref 12.0–15.0)
Immature Granulocytes: 0 %
Lymphocytes Relative: 33 %
Lymphs Abs: 3.6 10*3/uL (ref 0.7–4.0)
MCH: 29.2 pg (ref 26.0–34.0)
MCHC: 31.9 g/dL (ref 30.0–36.0)
MCV: 91.7 fL (ref 80.0–100.0)
Monocytes Absolute: 1.2 10*3/uL — ABNORMAL HIGH (ref 0.1–1.0)
Monocytes Relative: 11 %
Neutro Abs: 6 10*3/uL (ref 1.7–7.7)
Neutrophils Relative %: 54 %
Platelet Count: 432 10*3/uL — ABNORMAL HIGH (ref 150–400)
RBC: 4.24 MIL/uL (ref 3.87–5.11)
RDW: 13.2 % (ref 11.5–15.5)
WBC Count: 11 10*3/uL — ABNORMAL HIGH (ref 4.0–10.5)
nRBC: 0 % (ref 0.0–0.2)

## 2020-10-05 LAB — CMP (CANCER CENTER ONLY)
ALT: 12 U/L (ref 0–44)
AST: 18 U/L (ref 15–41)
Albumin: 3.8 g/dL (ref 3.5–5.0)
Alkaline Phosphatase: 87 U/L (ref 38–126)
Anion gap: 13 (ref 5–15)
BUN: 10 mg/dL (ref 8–23)
CO2: 28 mmol/L (ref 22–32)
Calcium: 9.1 mg/dL (ref 8.9–10.3)
Chloride: 100 mmol/L (ref 98–111)
Creatinine: 0.7 mg/dL (ref 0.44–1.00)
GFR, Estimated: >60 mL/min (ref >60)
Glucose, Bld: 72 mg/dL (ref 70–99)
Potassium: 3.5 mmol/L (ref 3.5–5.1)
Sodium: 141 mmol/L (ref 135–145)
Total Bilirubin: 0.2 mg/dL — ABNORMAL LOW (ref 0.3–1.2)
Total Protein: 7.6 g/dL (ref 6.5–8.1)

## 2020-10-05 LAB — LACTATE DEHYDROGENASE: LDH: 169 U/L (ref 98–192)

## 2020-10-05 NOTE — Progress Notes (Signed)
[place] [phone]   [phone]  PROGRESS NOTE  Patient Care Team: Carolee Rota, NP as PCP - General (Nurse Practitioner)  Hematological/Oncological History # IgG Kappa Monoclonal Gammopathy of Undetermined Significance 1) 11/24/2019: SPEP showed an M protein 0.1 with IgG Kappa specificity.  2) 01/26/2020: establish care with Dr. Lorenso Courier  3) 02/08/2020: DG bone survey shows lytic lesion on the humerus.  4)  02/21/2021: bone marrow biopsy shows the plasma cells represent 2% of all cells in the aspirate with lack of  large aggregates or sheets in the clot and biopsy sections and display polyclonal staining pattern for kappa and lambda light chains.  Interval History:  Molly Welch 76 y.o. female with medical history significant for IgG Kappa monoclonal gammopathy who presents for a follow up visit. The patient's last visit was on 01/26/2020. In the interim since the last visit she had a lytic lesion seen on Bone survey and a bone marrow biopsy performed which showed a small polyclonal plasma cell population.  On exam today Molly Welch notes she has been well in the interim since her last visit.  She reports that she has been a little tired but otherwise well.  She is still concerned about the tremor she has while writing.  She is hoping to get a second opinion with a different neurologist to help evaluate this tremor.  She reports that she does love yard work but can fatigued easily while performing it.  She otherwise denies any bone pain, back pain, nausea, vomiting, or diarrhea.  She denies any fevers, chills, sweats.  A full 10 point ROS is listed below.  MEDICAL HISTORY:  Past Medical History:  Diagnosis Date  . Abnormal chest x-ray 2005   COPD changes (2005) + interstitial lung dz changes (2002)  . Anxiety and depression    Prozac, celexa, paxil, wellbutrin, and buspar all failures.  Pt has stated xanax is only med that helps.  . Tobacco  dependence     SURGICAL HISTORY: Past Surgical History:  Procedure Laterality Date  . ABDOMINAL HYSTERECTOMY  1979  . APPENDECTOMY  1966    SOCIAL HISTORY: Social History   Socioeconomic History  . Marital status: Single    Spouse name: Not on file  . Number of children: Not on file  . Years of education: Not on file  . Highest education level: Not on file  Occupational History  . Not on file  Tobacco Use  . Smoking status: Current Every Day Smoker    Packs/day: 0.50    Years: 55.00    Pack years: 27.50  . Smokeless tobacco: Never Used  Substance and Sexual Activity  . Alcohol use: No  . Drug use: No  . Sexual activity: Not on file  Other Topics Concern  . Not on file  Social History Narrative  . Not on file   Social Determinants of Health   Financial Resource Strain: Not on file  Food Insecurity: Not on file  Transportation Needs: Not on file  Physical Activity: Not on file  Stress: Not on file  Social Connections: Not on file  Intimate Partner Violence: Not on file    FAMILY HISTORY: Family History  Problem Relation Age of Onset  . Diabetes Other   . Suicidality Father   . Heart disease Brother     ALLERGIES:  is allergic to doxycycline, prednisone, penicillins, and sulfonamide derivatives.  MEDICATIONS:  Current Outpatient Medications  Medication Sig Dispense Refill  . busPIRone (  BUSPAR) 10 MG tablet 1 tablet    . Dextran 70-Hypromellose (ARTIFICIAL TEARS) 0.1-0.3 % SOLN 1 drop into both eyes    . Pancrelipase, Lip-Prot-Amyl, (CREON PO) 2 capsules with each meal and 1 capsule with each snack    . propranolol (INDERAL) 40 MG tablet 1 tablet    . ALPRAZolam (XANAX) 0.5 MG tablet Take 0.5 mg by mouth 3 (three) times daily as needed for anxiety.     . Amylase-Lipase-Protease (CREON 10 PO) Take by mouth.    Marland Kitchen CALCIUM PO Take 1 tablet by mouth every morning.    . diclofenac Sodium (VOLTAREN) 1 % GEL See admin instructions.    . gabapentin (NEURONTIN)  100 MG capsule Take 100 mg by mouth.    . lamoTRIgine (LAMICTAL) 25 MG tablet Take 25 mg by mouth every morning.    . Multiple Vitamins-Minerals (CENTRUM SILVER 50+WOMEN PO) 1 tablet    . valACYclovir (VALTREX) 500 MG tablet Take 500 mg by mouth daily.    . vitamin C (ASCORBIC ACID) 500 MG tablet Take 1,000 mg by mouth every morning.     No current facility-administered medications for this visit.    REVIEW OF SYSTEMS:   Constitutional: ( - ) fevers, ( - )  chills , ( - ) night sweats Eyes: ( - ) blurriness of vision, ( - ) double vision, ( - ) watery eyes Ears, nose, mouth, throat, and face: ( - ) mucositis, ( - ) sore throat Respiratory: ( - ) cough, ( - ) dyspnea, ( - ) wheezes Cardiovascular: ( - ) palpitation, ( - ) chest discomfort, ( - ) lower extremity swelling Gastrointestinal:  ( - ) nausea, ( - ) heartburn, ( - ) change in bowel habits Skin: ( - ) abnormal skin rashes Lymphatics: ( - ) new lymphadenopathy, ( - ) easy bruising Neurological: ( - ) numbness, ( - ) tingling, ( - ) new weaknesses Behavioral/Psych: ( - ) mood change, ( - ) new changes  All other systems were reviewed with the patient and are negative.  PHYSICAL EXAMINATION: ECOG PERFORMANCE STATUS: 1 - Symptomatic but completely ambulatory  Vitals:   10/05/20 1409  BP: 138/77  Pulse: 65  Resp: 15  Temp: 98 F (36.7 C)  SpO2: 100%   Filed Weights   10/05/20 1409  Weight: 113 lb 14.4 oz (51.7 kg)    GENERAL: well appearing elderly Caucasian female alert, no distress and comfortable SKIN: skin color, texture, turgor are normal, no rashes or significant lesions EYES: conjunctiva are pink and non-injected, sclera clear LUNGS: clear to auscultation and percussion with normal breathing effort HEART: regular rate & rhythm and no murmurs and no lower extremity edema Musculoskeletal: no cyanosis of digits and no clubbing  PSYCH: alert & oriented x 3, fluent speech NEURO: no focal motor/sensory  deficits  LABORATORY DATA:  I have reviewed the data as listed CBC Latest Ref Rng & Units 10/05/2020 02/22/2020 01/26/2020  WBC 4.0 - 10.5 K/uL 11.0(H) 8.3 8.1  Hemoglobin 12.0 - 15.0 g/dL 12.4 12.8 13.0  Hematocrit 36.0 - 46.0 % 38.9 39.0 40.5  Platelets 150 - 400 K/uL 432(H) 305 329    CMP Latest Ref Rng & Units 10/05/2020 01/26/2020 11/24/2019  Glucose 70 - 99 mg/dL 72 85 83  BUN 8 - 23 mg/dL 10 15 13   Creatinine 0.44 - 1.00 mg/dL 0.70 0.62 0.70  Sodium 135 - 145 mmol/L 141 140 139  Potassium 3.5 - 5.1 mmol/L 3.5 3.6  4.4  Chloride 98 - 111 mmol/L 100 101 101  CO2 22 - 32 mmol/L 28 31 25   Calcium 8.9 - 10.3 mg/dL 9.1 9.3 9.7  Total Protein 6.5 - 8.1 g/dL 7.6 7.5 7.1  Total Bilirubin 0.3 - 1.2 mg/dL 0.2(L) 0.5 0.2  Alkaline Phos 38 - 126 U/L 87 66 79  AST 15 - 41 U/L 18 24 26   ALT 0 - 44 U/L 12 16 12     Lab Results  Component Value Date   MPROTEIN 0.1 (H) 01/26/2020   MPROTEIN 0.1 (H) 11/24/2019   Lab Results  Component Value Date   KPAFRELGTCHN 37.7 (H) 01/26/2020   LAMBDASER 25.2 01/26/2020   KAPLAMBRATIO 11.37 01/30/2020   KAPLAMBRATIO 1.50 01/26/2020   RADIOGRAPHIC STUDIES: No results found.  ASSESSMENT & PLAN Molly Welch 75 y.o. female with medical history significant for IgG Kappa monoclonal gammopathy who presents for a follow up visit.   After review of the labs, discussion with the patient, and review of the outside records the findings are most consistent with a monoclonal gammopathy of undetermined significance.  The M protein is only mildly elevated at 0.1 and his IgG G kappa specificity.  Given these findings there is no indication for a bone marrow biopsy at this time, however this may change with further evaluation. For follow up visits I recommend an SPEP, UPEP, serum free light chains, beta-2 microglobulin, LDH, and a DG metastatic survey (urine and survey at least yearly).  In the event the patient is not found to have progression or other risk factors for  more aggressive disease we will continue to monitor her with a repeat visit in 6 months time and if she has continued stability, every 12 months thereafter.  # IgG Kappa Monoclonal Gammopathy of Undetermined Significance --today will repeat SPEP and additionally will order SFLC, beta 2 microglobulin, LDH, UPEP, and metastatic survey --no evidence of anemia or kidney dysfunction.  --bone marrow biopsy performed, showed 2% polyclonal plasma cell population.  --recommend routine f/u in 12 months assuming stable labs/imaging.  Orders Placed This Encounter  Procedures  . DG Bone Survey Met    Standing Status:   Future    Standing Expiration Date:   10/05/2021    Order Specific Question:   Reason for Exam (SYMPTOM  OR DIAGNOSIS REQUIRED)    Answer:   assess lytic lesion, assure no new lytic lesions    Order Specific Question:   Preferred imaging location?    Answer:   [facility]    All questions were answered. The patient knows to call the clinic with any problems, questions or concerns.  A total of more than 30 minutes were spent on this encounter and over half of that time was spent on counseling and coordination of care as outlined above.   Ledell Peoples, MD Department of Hematology/Oncology [place] at [facility] Phone: [phone] Pager: [phone] Email: [email or web address]  10/05/2020 2:53 PM

## 2020-10-05 NOTE — Telephone Encounter (Signed)
TCT patient regarding today's appt. Spoke with patient and asked if we could move her appt to earlier in the day today. Pt is agreeable to come at 2Pm for labs and 2:30 pm for MD appt.  Scheduling message sent

## 2020-10-06 LAB — BETA 2 MICROGLOBULIN, SERUM: Beta-2 Microglobulin: 1.8 mg/L (ref 0.6–2.4)

## 2020-10-08 LAB — MULTIPLE MYELOMA PANEL, SERUM
Albumin SerPl Elph-Mcnc: 3.6 g/dL (ref 2.9–4.4)
Albumin/Glob SerPl: 1.1 (ref 0.7–1.7)
Alpha 1: 0.3 g/dL (ref 0.0–0.4)
Alpha2 Glob SerPl Elph-Mcnc: 1.2 g/dL — ABNORMAL HIGH (ref 0.4–1.0)
B-Globulin SerPl Elph-Mcnc: 1 g/dL (ref 0.7–1.3)
Gamma Glob SerPl Elph-Mcnc: 0.9 g/dL (ref 0.4–1.8)
Globulin, Total: 3.4 g/dL (ref 2.2–3.9)
IgA: 198 mg/dL (ref 64–422)
IgG (Immunoglobin G), Serum: 852 mg/dL (ref 586–1602)
IgM (Immunoglobulin M), Srm: 149 mg/dL (ref 26–217)
M Protein SerPl Elph-Mcnc: 0.1 g/dL — ABNORMAL HIGH
Total Protein ELP: 7 g/dL (ref 6.0–8.5)

## 2020-10-08 LAB — KAPPA/LAMBDA LIGHT CHAINS
Kappa free light chain: 25.9 mg/L — ABNORMAL HIGH (ref 3.3–19.4)
Kappa, lambda light chain ratio: 1.36 (ref 0.26–1.65)
Lambda free light chains: 19 mg/L (ref 5.7–26.3)

## 2020-10-09 ENCOUNTER — Telehealth: Payer: Self-pay | Admitting: Hematology and Oncology

## 2020-10-09 NOTE — Telephone Encounter (Signed)
Scheduled per los. Called and left msg. Mailed printout  °

## 2020-10-11 ENCOUNTER — Telehealth: Payer: Self-pay | Admitting: *Deleted

## 2020-10-11 NOTE — Telephone Encounter (Signed)
Received vm message from patient asking about what day(s) she can drop off her 24 hour urine sample.  She is also asking about the referral to Dr. Mickeal Skinner for her tremors. TCT patient but no answer. Able to leave vm message on an identified phone. Advised that she can drop off her 24 urine Monday-Friday 8am -3p.  Advised that the lab is closed on the weekends.  Also advised that we will schedule her with Dr. Mickeal Skinner soon-his nurse is working on that.  Advised to call back to [phone] with any further questions or concerns

## 2020-10-15 ENCOUNTER — Other Ambulatory Visit: Payer: Self-pay | Admitting: *Deleted

## 2020-10-15 DIAGNOSIS — L821 Other seborrheic keratosis: Secondary | ICD-10-CM | POA: Diagnosis not present

## 2020-10-15 DIAGNOSIS — L578 Other skin changes due to chronic exposure to nonionizing radiation: Secondary | ICD-10-CM | POA: Diagnosis not present

## 2020-10-15 DIAGNOSIS — Z79899 Other long term (current) drug therapy: Secondary | ICD-10-CM | POA: Diagnosis not present

## 2020-10-15 DIAGNOSIS — D472 Monoclonal gammopathy: Secondary | ICD-10-CM

## 2020-10-15 DIAGNOSIS — L570 Actinic keratosis: Secondary | ICD-10-CM | POA: Diagnosis not present

## 2020-10-15 DIAGNOSIS — F17210 Nicotine dependence, cigarettes, uncomplicated: Secondary | ICD-10-CM | POA: Diagnosis not present

## 2020-10-15 DIAGNOSIS — R531 Weakness: Secondary | ICD-10-CM | POA: Diagnosis not present

## 2020-10-15 DIAGNOSIS — G629 Polyneuropathy, unspecified: Secondary | ICD-10-CM | POA: Diagnosis not present

## 2020-10-15 DIAGNOSIS — L718 Other rosacea: Secondary | ICD-10-CM | POA: Diagnosis not present

## 2020-10-15 DIAGNOSIS — G252 Other specified forms of tremor: Secondary | ICD-10-CM | POA: Diagnosis not present

## 2020-10-16 LAB — UPEP/UIFE/LIGHT CHAINS/TP, 24-HR UR
% BETA, Urine: 17 %
ALPHA 1 URINE: 7.3 %
Albumin, U: 44.6 %
Alpha 2, Urine: 13.4 %
Free Kappa Lt Chains,Ur: 17.93 mg/L (ref 1.17–86.46)
Free Kappa/Lambda Ratio: 9.01 (ref 1.83–14.26)
Free Lambda Lt Chains,Ur: 1.99 mg/L (ref 0.27–15.21)
GAMMA GLOBULIN URINE: 17.6 %
Total Protein, Urine-Ur/day: 105 mg/24 hr (ref 30–150)
Total Protein, Urine: 6.8 mg/dL
Total Volume: 1550

## 2020-10-18 ENCOUNTER — Other Ambulatory Visit: Payer: Self-pay

## 2020-10-18 ENCOUNTER — Inpatient Hospital Stay: Payer: Medicare Other

## 2020-10-18 ENCOUNTER — Inpatient Hospital Stay: Payer: Medicare Other | Admitting: Internal Medicine

## 2020-10-18 VITALS — BP 145/70 | HR 65 | Temp 97.6°F | Resp 16 | Ht 62.0 in | Wt 112.4 lb

## 2020-10-18 DIAGNOSIS — D472 Monoclonal gammopathy: Secondary | ICD-10-CM | POA: Diagnosis not present

## 2020-10-18 DIAGNOSIS — G629 Polyneuropathy, unspecified: Secondary | ICD-10-CM

## 2020-10-18 DIAGNOSIS — R531 Weakness: Secondary | ICD-10-CM | POA: Diagnosis not present

## 2020-10-18 DIAGNOSIS — R251 Tremor, unspecified: Secondary | ICD-10-CM | POA: Diagnosis not present

## 2020-10-18 DIAGNOSIS — Z79899 Other long term (current) drug therapy: Secondary | ICD-10-CM | POA: Diagnosis not present

## 2020-10-18 DIAGNOSIS — G252 Other specified forms of tremor: Secondary | ICD-10-CM | POA: Diagnosis not present

## 2020-10-18 DIAGNOSIS — F17210 Nicotine dependence, cigarettes, uncomplicated: Secondary | ICD-10-CM | POA: Diagnosis not present

## 2020-10-18 MED ORDER — PRIMIDONE 50 MG PO TABS: 50.0000 mg | ORAL_TABLET | Freq: Every day | ORAL | 3 refills | Status: DC

## 2020-10-18 MED ORDER — PREGABALIN 75 MG PO CAPS: 75.0000 mg | ORAL_CAPSULE | Freq: Two times a day (BID) | ORAL | 2 refills | Status: DC

## 2020-10-18 NOTE — Progress Notes (Signed)
[facility] [place], [place] [address] [phone]   New Patient Evaluation  Date of Service: 10/18/20 Patient Name: Molly Welch Patient MRN: [id number] Patient DOB: [birth year] Provider: Ventura Sellers, MD  Identifying Statement:  Molly Welch is a 75 y.o. 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    Referring Provider: Carolee Rota, NP [address],  [place] [address]  Primary Cancer:  IgG kappa monoclonal gammopathy  History of Present Illness: The patient's records from the referring physician were obtained and reviewed and the patient interviewed to confirm this HPI.  Molly Welch present to clinic to review neuropathic symptoms, as well as tremor.  She describes 6 weeks history of weakness in her hands, limiting her ability to garden, write with a pencil, reach for small objects.  This clearly involves both right and left hands, despite chronic brace on left wrist from prior fracture.  Although she denies frank numbness of the limbs, she does describe burning type pain in both feet, rising up to the lower ankles at times.  This has been present for longer period, ~1 year or so.  Concurrent with the hand weakness has been a tremor affecting the right hand.  It seems to be most affective when she writing.  She also complains of general fatigue, and occasional dizziness.    Medications: Current Outpatient Medications on File Prior to Visit  Medication Sig Dispense Refill  . ALPRAZolam (XANAX) 0.5 MG tablet Take 0.5 mg by mouth 3 (three) times daily as needed for anxiety.     . Amylase-Lipase-Protease (CREON 10 PO) Take by mouth.    . busPIRone (BUSPAR) 10 MG tablet 1 tablet    . CALCIUM PO Take 1 tablet by mouth every morning.    Marland Kitchen Dextran 70-Hypromellose (ARTIFICIAL TEARS)  0.1-0.3 % SOLN 1 drop into both eyes    . diclofenac Sodium (VOLTAREN) 1 % GEL See admin instructions.    . lamoTRIgine (LAMICTAL) 25 MG tablet Take 25 mg by mouth every morning.    . Multiple Vitamins-Minerals (CENTRUM SILVER 50+WOMEN PO) 1 tablet    . Pancrelipase, Lip-Prot-Amyl, (CREON PO) 2 capsules with each meal and 1 capsule with each snack    . valACYclovir (VALTREX) 500 MG tablet Take 500 mg by mouth daily.    . vitamin C (ASCORBIC ACID) 500 MG tablet Take 1,000 mg by mouth every morning.     No current facility-administered medications on file prior to visit.    Allergies:  Allergies  Allergen Reactions  . Doxycycline Swelling  . Prednisone Other (See Comments)    Very emotional, crying, angry  . Penicillins Rash    Has patient had a PCN reaction causing immediate rash, facial/tongue/throat swelling, SOB or lightheadedness with hypotension: yes Has patient had a PCN reaction causing severe rash involving mucus membranes or skin necrosis: no Has patient had a PCN reaction that required hospitalization: no Has patient had a PCN reaction occurring within the last 10 years: no If all of the above answers are "NO", then may proceed with Cephalosporin use.   . Sulfonamide Derivatives Rash   Past Medical History:  Past Medical History:  Diagnosis Date  . Abnormal chest x-ray 2005   COPD changes (2005) + interstitial lung dz changes (2002)  . Anxiety and depression    Prozac, celexa, paxil, wellbutrin, and  buspar all failures.  Pt has stated xanax is only med that helps.  . Tobacco dependence    Past Surgical History:  Past Surgical History:  Procedure Laterality Date  . ABDOMINAL HYSTERECTOMY  1979  . APPENDECTOMY  1966   Social History:  Social History   Socioeconomic History  . Marital status: Single    Spouse name: Not on file  . Number of children: Not on file  . Years of education: Not on file  . Highest education level: Not on file  Occupational History  .  Not on file  Tobacco Use  . Smoking status: Current Every Day Smoker    Packs/day: 0.50    Years: 55.00    Pack years: 27.50  . Smokeless tobacco: Never Used  Substance and Sexual Activity  . Alcohol use: No  . Drug use: No  . Sexual activity: Not on file  Other Topics Concern  . Not on file  Social History Narrative  . Not on file   Social Determinants of Health   Financial Resource Strain: Not on file  Food Insecurity: Not on file  Transportation Needs: Not on file  Physical Activity: Not on file  Stress: Not on file  Social Connections: Not on file  Intimate Partner Violence: Not on file   Family History:  Family History  Problem Relation Age of Onset  . Diabetes Other   . Suicidality Father   . Heart disease Brother     Review of Systems: Constitutional: Doesn't report fevers, chills or abnormal weight loss Eyes: Doesn't report blurriness of vision Ears, nose, mouth, throat, and face: Doesn't report sore throat Respiratory: Doesn't report cough, dyspnea or wheezes Cardiovascular: Doesn't report palpitation, chest discomfort  Gastrointestinal:  Doesn't report nausea, constipation, diarrhea GU: Doesn't report incontinence Skin: Doesn't report skin rashes Neurological: Per HPI Musculoskeletal: Doesn't report joint pain Behavioral/Psych: Doesn't report anxiety  Physical Exam: Vitals:   10/18/20 1214  BP: (!) 145/70  Pulse: 65  Resp: 16  Temp: 97.6 F (36.4 C)  SpO2: 100%   KPS: 80. General: Alert, cooperative, pleasant, in no acute distress Head: Normal EENT: No conjunctival injection or scleral icterus.  Lungs: Resp effort normal Cardiac: Regular rate Abdomen: Non-distended abdomen Skin: No rashes cyanosis or petechiae. Extremities: No clubbing or edema  Neurologic Exam: Mental Status: Awake, alert, attentive to examiner. Oriented to self and environment. Language is fluent with intact comprehension.  Cranial Nerves: Visual acuity is grossly  normal. Visual fields are full. Extra-ocular movements intact. No ptosis. Face is symmetric Motor: Tone and bulk are normal. Power is impaired distally with regards to fine motor in hands. Reflexes are trace or absent, no pathologic reflexes present.  Sensory: Impaired in stocking pattern Gait: Normal.   Labs: I have reviewed the data as listed    Component Value Date/Time   NA 141 10/05/2020 1350   NA 139 11/24/2019 1527   K 3.5 10/05/2020 1350   CL 100 10/05/2020 1350   CO2 28 10/05/2020 1350   GLUCOSE 72 10/05/2020 1350   BUN 10 10/05/2020 1350   BUN 13 11/24/2019 1527   CREATININE 0.70 10/05/2020 1350   CALCIUM 9.1 10/05/2020 1350   PROT 7.6 10/05/2020 1350   PROT 7.1 11/24/2019 1527   ALBUMIN 3.8 10/05/2020 1350   ALBUMIN 4.3 11/24/2019 1527   AST 18 10/05/2020 1350   ALT 12 10/05/2020 1350   ALKPHOS 87 10/05/2020 1350   BILITOT 0.2 (L) 10/05/2020 1350   GFRNONAA >60 10/05/2020  1350   GFRAA >60 01/26/2020 1451   Lab Results  Component Value Date   WBC 11.0 (H) 10/05/2020   NEUTROABS 6.0 10/05/2020   HGB 12.4 10/05/2020   HCT 38.9 10/05/2020   MCV 91.7 10/05/2020   PLT 432 (H) 10/05/2020    Assessment/Plan 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  Sensory component is distal, small fiber, more chronic over the past year.  The motor component is new as of the past 2 months, and is characterized by progressive, symmetric impairment of fine motor function in hands.  In addition, she complains of intention tremor in the right hand, previously not responsive to propanolol.  This is not appreciated today on exam, though the patient had dosed benzodiazepine prior to the appt.  Given her diagnosis of IgG monoclonal gammopathy, there is reason to suspect auto-immune or CIDP type picture here.  We recommended testing for myelin associated glycoprotein  antibodies, and repeating NCS/EMG testing which had been performed last year (prior to new symptom onset).  For tremor will recommend trial of primidone 50mg  HS.  For neuropathic pain, given failure of gabapentin, will recommend trial of Lyrica 75mg  BID.   We spent twenty additional minutes teaching regarding the natural history, biology, and historical experience in the treatment of neurologic complications of cancer.   We appreciate the opportunity to participate in the care of Molly Welch.  We will touch base with her shortly for follow up after testing is complete.  All questions were answered. The patient knows to call the clinic with any problems, questions or concerns. No barriers to learning were detected.  The total time spent in the encounter was 40 minutes and more than 50% was on counseling and review of test results   Ventura Sellers, MD Medical Director of Neuro-Oncology [facility] at [place] 10/18/20 2:12 PM

## 2020-10-19 ENCOUNTER — Ambulatory Visit (HOSPITAL_COMMUNITY)
Admission: RE | Admit: 2020-10-19 | Discharge: 2020-10-19 | Disposition: A | Discharge status: Home / Self Care | Payer: Medicare Other | Source: Ambulatory Visit | Attending: Hematology and Oncology | Admitting: Hematology and Oncology

## 2020-10-19 DIAGNOSIS — D472 Monoclonal gammopathy: Secondary | ICD-10-CM

## 2020-10-23 ENCOUNTER — Telehealth: Payer: Self-pay | Admitting: *Deleted

## 2020-10-23 LAB — MAG INTERPRETATION REFLEXED

## 2020-10-23 LAB — MAG IGM ANTIBODIES: MAG IgM Antibodies: <900 BTU (ref 0–999)

## 2020-10-23 NOTE — Telephone Encounter (Signed)
Faxed referral for EMG to [facility].

## 2020-10-23 NOTE — Telephone Encounter (Signed)
Patient wanted to know results for her Bone Scan.  Routed to Dr Lorenso Courier to advise if results and interpretation to relay to the patient.

## 2020-10-23 NOTE — Telephone Encounter (Signed)
Patient called to advise that she started taking the Lyrica and only had a few doses and developed double vision.  She reports she was taking it twice daily (am/pm).   She has only taken it in the evening yesterday and fees like the double vision has lessened.    Per Dr Mickeal Skinner she could completely stop the Lyrica and go back on the Gabapentin.  She states she will just try to take it once in the evening and see if the vision issues continue to be resolved and give that a try.    Routed to Dr Jones Broom

## 2020-10-25 LAB — ANTI-MYELIN ASSOC GLYCOP IGG: Anti-Myelin Assoc Glycop IgG: <1:10 {titer}

## 2020-10-26 ENCOUNTER — Telehealth: Payer: Self-pay | Admitting: *Deleted

## 2020-10-26 NOTE — Telephone Encounter (Signed)
TCT patient regarding recent lab results and xrays. No answer but was able to leave detailed message for her on identified phone #. Advised that her xrays are stable as are her MGUS labs. Advised we will see her back in 12 months.  Advised that she cak call back if needed @ [phone]

## 2020-10-26 NOTE — Telephone Encounter (Signed)
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 Her labs also show stable MGUS. We will plan to see her back in 12 months time.   ----- Message ----- From: Interface, Rad Results In Sent: 10/19/2020   6:59 PM EDT To: Molly Slick, MD

## 2020-11-02 ENCOUNTER — Telehealth: Payer: Self-pay | Admitting: *Deleted

## 2020-11-02 NOTE — Telephone Encounter (Signed)
Received call from patient wirth questions regarding new medications.  She states she tried the Lamictal for 2 weeks and it made her very "loopy". She stopped taking it and resumed her Gabapentin 100 mg at night along with the new Mysoline at night. Pt states she still feels bit loopy when she wakes up in the morning but it passes after about an hour or so. She is also c/o blurred vision. Tremors are slightly better.   She would like to know what she should do regarding the 3 medications-Lyrica, Gabapentin and Mysoline.  Please advise

## 2020-11-05 ENCOUNTER — Telehealth: Payer: Self-pay | Admitting: *Deleted

## 2020-11-05 NOTE — Telephone Encounter (Signed)
EMG still hasn't been scheduled yet but spoke with [facility] and they are working on her referral.

## 2020-11-06 ENCOUNTER — Inpatient Hospital Stay: Payer: Medicare Other | Attending: Hematology and Oncology | Admitting: Internal Medicine

## 2020-11-06 DIAGNOSIS — G629 Polyneuropathy, unspecified: Secondary | ICD-10-CM | POA: Diagnosis not present

## 2020-11-06 DIAGNOSIS — R251 Tremor, unspecified: Secondary | ICD-10-CM

## 2020-11-06 DIAGNOSIS — D472 Monoclonal gammopathy: Secondary | ICD-10-CM

## 2020-11-06 NOTE — Progress Notes (Signed)
I connected with Molly Welch on 11/06/20 at  9:30 AM EDT by telephone visit and verified that I am speaking with the correct person using two identifiers.  I discussed the limitations, risks, security and privacy concerns of performing an evaluation and management service by telemedicine and the availability of in-person appointments. I also discussed with the patient that there may be a patient responsible charge related to this service. The patient expressed understanding and agreed to proceed.  Other persons participating in the visit and their role in the encounter:  n/a  Patient's location:  Home  Provider's location:  Office  Chief Complaint:  Neuropathy associated with benign monoclonal gammopathy  Tremor of unknown origin  History of Present Ilness: Molly Welch describes some improvement in tremor with Primidone.  Her neuropathy is still present, helped out at night by the Lyrica (not dosing in AM).  Doesn't describe any improvement in hand weakness.  Still hasn't scehduled NCS/EMG Observations: Language and cognition at baseline Assessment and Plan: Neuropathy associated with benign monoclonal gammopathy  Tremor of unknown origin  Recommended BID dosing of Lyrica, no other changes.  She understands why EMG is important given our concern for potential inflammatory motor neuropathy  Follow Up Instructions: RTC after testing  I discussed the assessment and treatment plan with the patient.  The patient was provided an opportunity to ask questions and all were answered.  The patient agreed with the plan and demonstrated understanding of the instructions.    The patient was advised to call back or seek an in-person evaluation if the symptoms worsen or if the condition fails to improve as anticipated.  I provided 5-10 minutes of non-face-to-face time during this enocunter.  Ventura Sellers, MD   I provided 15 minutes of non face-to-face telephone visit time during this  encounter, and > 50% was spent counseling as documented under my assessment & plan.

## 2020-11-26 ENCOUNTER — Encounter: Payer: Medicare Other | Admitting: Neurology

## 2020-11-29 ENCOUNTER — Encounter: Payer: Medicare Other | Admitting: Diagnostic Neuroimaging

## 2020-12-13 ENCOUNTER — Ambulatory Visit (INDEPENDENT_AMBULATORY_CARE_PROVIDER_SITE_OTHER): Payer: Medicare Other | Admitting: Diagnostic Neuroimaging

## 2020-12-13 ENCOUNTER — Encounter: Payer: Medicare Other | Admitting: Diagnostic Neuroimaging

## 2020-12-13 DIAGNOSIS — D472 Monoclonal gammopathy: Secondary | ICD-10-CM

## 2020-12-13 DIAGNOSIS — G629 Polyneuropathy, unspecified: Secondary | ICD-10-CM

## 2020-12-13 DIAGNOSIS — Z0289 Encounter for other administrative examinations: Secondary | ICD-10-CM

## 2020-12-13 NOTE — Procedures (Signed)
[facility]  NCS (NERVE CONDUCTION STUDY) WITH EMG (ELECTROMYOGRAPHY) REPORT   STUDY DATE: 12/12/20 PATIENT NAME: Molly Welch DOB: October 05, 1945 MRN: [id number]  ORDERING CLINICIAN: Merrilyn Puma, MD  TECHNOLOGIST: Sherre Scarlet ELECTROMYOGRAPHER: Earlean Polka. Jordanne Elsbury, MD  CLINICAL INFORMATION: 75 year old female with numbness and burning.  FINDINGS: NERVE CONDUCTION STUDY:  Bilateral median, right ulnar, right peroneal and right tibial motor responses are normal.  Right sural, right superficial peroneal, right ulnar, left median sensory responses are normal.  Right median sensory response has slightly prolonged peak latency and normal amplitude.  Right tibial and right ulnar F-wave latencies are normal.    NEEDLE ELECTROMYOGRAPHY:  Needle examination of right upper extremity is normal.    IMPRESSION:   This study demonstrates: -Very mild right median sensory neuropathy. -No underlying evidence of widespread large fiber neuropathy. -Compared to prior study in 2021, no evidence of progression of neuropathy or motor neuropathy. Prior study demonstrated diffuse sensory axonal neuropathy which is not evident on current study.    INTERPRETING PHYSICIAN:  Penni Bombard, MD Certified in Neurology, Neurophysiology and Neuroimaging  [facility] [address] [place], [place] [address] [phone]   [facility]    Nerve / Sites Muscle Latency Ref. Amplitude Ref. Rel Amp Segments Distance Velocity Ref. Area    ms ms mV mV %  cm m/s m/s mVms  R Median - APB     Wrist APB 3.8 ?4.4 8.8 ?4.0 100 Wrist - APB 7   33.9     Upper arm APB 8.0  8.6  97.3 Upper arm - Wrist 22 52 ?49 33.9  L Median - APB     Wrist APB 3.6 ?4.4 6.9 ?4.0 100 Wrist - APB 7   28.4     Upper arm APB 7.8  6.3  91.1 Upper arm - Wrist 21 51 ?49 25.8  R Ulnar - ADM     Wrist ADM 3.3 ?3.3 8.5 ?6.0 100 Wrist - ADM 7   30.7     B.Elbow ADM 7.2  8.6  102 B.Elbow -  Wrist 19 49 ?49 35.8     A.Elbow ADM 9.2  8.4  97.8 A.Elbow - B.Elbow 10 49 ?49 35.0  R Peroneal - EDB     Ankle EDB 5.0 ?6.5 3.9 ?2.0 100 Ankle - EDB 9   13.9     Fib head EDB 10.7  3.1  79 Fib head - Ankle 25 44 ?44 15.0     Pop fossa EDB 13.0  3.1  102 Pop fossa - Fib head 10 44 ?44 15.2         Pop fossa - Ankle      R Tibial - AH     Ankle AH 4.2 ?5.8 8.5 ?4.0 100 Ankle - AH 9   14.6     Pop fossa AH 13.1  6.6  77.7 Pop fossa - Ankle 37 41 ?41 15.9                SNC    Nerve / Sites Rec. Site Peak Lat Ref.  Amp Ref. Segments Distance    ms ms V V  cm  R Sural - Ankle (Calf)     Calf Ankle 3.3 ?4.4 11 ?6 Calf - Ankle 14  R Superficial peroneal - Ankle     Lat leg Ankle 3.5 ?4.4 7 ?6 Lat leg - Ankle 14  R Median - Orthodromic (Dig II, Mid palm)  Dig II Wrist 3.7 ?3.4 12 ?10 Dig II - Wrist 13  L Median - Orthodromic (Dig II, Mid palm)     Dig II Wrist 3.4 ?3.4 14 ?10 Dig II - Wrist 13  R Ulnar - Orthodromic, (Dig V, Mid palm)     Dig V Wrist 3.1 ?3.1 7 ?5 Dig V - Wrist 98               F  Wave    Nerve F Lat Ref.   ms ms  R Tibial - AH 42.3 ?56.0  R Ulnar - ADM 29.5 ?32.0         EMG Summary Table    Spontaneous MUAP Recruitment  Muscle IA Fib PSW Fasc Other Amp Dur. Poly Pattern  R. Deltoid Normal None None None _______ Normal Normal Normal Normal  R. Biceps brachii Normal None None None _______ Normal Normal Normal Normal  R. Triceps brachii Normal None None None _______ Normal Normal Normal Normal  R. Flexor carpi radialis Normal None None None _______ Normal Normal Normal Normal  R. First dorsal interosseous Normal None None None _______ Normal Normal Normal Normal

## 2020-12-14 ENCOUNTER — Telehealth: Payer: Self-pay | Admitting: Internal Medicine

## 2020-12-14 NOTE — Telephone Encounter (Signed)
Per 6/10 sch msg, left message

## 2020-12-18 ENCOUNTER — Telehealth: Payer: Self-pay | Admitting: Neurology

## 2020-12-18 NOTE — Telephone Encounter (Signed)
See my chart message from 12/18/20.

## 2020-12-18 NOTE — Telephone Encounter (Signed)
Pt called, since having Nerve Conduction I have been sick with nausea, fatigue. Want to know if medication was in the needle. Would like a call from the nurse.

## 2020-12-24 ENCOUNTER — Other Ambulatory Visit: Payer: Self-pay

## 2020-12-24 ENCOUNTER — Encounter: Payer: Self-pay | Admitting: Internal Medicine

## 2020-12-24 ENCOUNTER — Inpatient Hospital Stay: Payer: Medicare Other | Attending: Hematology and Oncology | Admitting: Internal Medicine

## 2020-12-24 VITALS — BP 130/74 | HR 71 | Temp 98.6°F | Resp 18 | Ht 62.0 in | Wt 111.5 lb

## 2020-12-24 DIAGNOSIS — F32A Depression, unspecified: Secondary | ICD-10-CM | POA: Insufficient documentation

## 2020-12-24 DIAGNOSIS — F419 Anxiety disorder, unspecified: Secondary | ICD-10-CM | POA: Diagnosis not present

## 2020-12-24 DIAGNOSIS — Z888 Allergy status to other drugs, medicaments and biological substances status: Secondary | ICD-10-CM | POA: Insufficient documentation

## 2020-12-24 DIAGNOSIS — R531 Weakness: Secondary | ICD-10-CM | POA: Insufficient documentation

## 2020-12-24 DIAGNOSIS — D472 Monoclonal gammopathy: Secondary | ICD-10-CM | POA: Insufficient documentation

## 2020-12-24 DIAGNOSIS — R42 Dizziness and giddiness: Secondary | ICD-10-CM | POA: Insufficient documentation

## 2020-12-24 DIAGNOSIS — Z882 Allergy status to sulfonamides status: Secondary | ICD-10-CM | POA: Diagnosis not present

## 2020-12-24 DIAGNOSIS — Z8249 Family history of ischemic heart disease and other diseases of the circulatory system: Secondary | ICD-10-CM | POA: Diagnosis not present

## 2020-12-24 DIAGNOSIS — F17210 Nicotine dependence, cigarettes, uncomplicated: Secondary | ICD-10-CM | POA: Diagnosis not present

## 2020-12-24 DIAGNOSIS — R251 Tremor, unspecified: Secondary | ICD-10-CM | POA: Insufficient documentation

## 2020-12-24 DIAGNOSIS — Z881 Allergy status to other antibiotic agents status: Secondary | ICD-10-CM | POA: Insufficient documentation

## 2020-12-24 DIAGNOSIS — Z818 Family history of other mental and behavioral disorders: Secondary | ICD-10-CM | POA: Insufficient documentation

## 2020-12-24 DIAGNOSIS — Z9049 Acquired absence of other specified parts of digestive tract: Secondary | ICD-10-CM | POA: Diagnosis not present

## 2020-12-24 DIAGNOSIS — G629 Polyneuropathy, unspecified: Secondary | ICD-10-CM | POA: Diagnosis not present

## 2020-12-24 DIAGNOSIS — Z833 Family history of diabetes mellitus: Secondary | ICD-10-CM | POA: Diagnosis not present

## 2020-12-24 DIAGNOSIS — Z79899 Other long term (current) drug therapy: Secondary | ICD-10-CM | POA: Insufficient documentation

## 2020-12-24 DIAGNOSIS — Z880 Allergy status to penicillin: Secondary | ICD-10-CM | POA: Insufficient documentation

## 2020-12-24 MED ORDER — DULOXETINE HCL 30 MG PO CPEP
30.0000 mg | ORAL_CAPSULE | Freq: Every day | ORAL | 3 refills | Status: DC
Start: 2020-12-24 — End: 2021-03-27

## 2020-12-24 NOTE — Progress Notes (Signed)
[facility] [place], [place] [address] [phone]   Interval Evaluation  Date of Service: 12/24/20 Patient Name: Molly Welch Patient MRN: [id number] Patient DOB: December 23, 1945 Provider: Ventura Sellers, MD  Identifying Statement:  Molly Welch is a 75 y.o. 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  She was unable to tolerated the Lyrica because of sedation, dizziness.  Neuropathic pain continues as prior in lower feet and fingertips, though not worse from prior.  Her tremors have clearly improved with the primidone, though not completely resolved.  When she takes Xanax for anxiety, this helps her tremors as well.  No other new or progressive deficits.  Weakness in hands is unchanged.  She did complete EMG earlier this month.  H+P (11/06/20) Patient presents to clinic to review neuropathic symptoms, as well as tremor.  She describes 6 weeks history of weakness in her hands, limiting her ability to garden, write with a pencil, reach for small objects.  This clearly involves both right and left hands, despite chronic brace on left wrist from prior fracture.  Although she denies frank numbness of the limbs, she does describe burning type pain in both feet, rising up to the lower ankles at times.  This has been present for longer period, ~1 year or so.  Concurrent with the hand weakness has been a tremor affecting the right hand.  It seems to be most affective when she writing.  She also complains of general fatigue, and occasional dizziness.    Medications: Current Outpatient Medications on File Prior to Visit  Medication Sig Dispense Refill   ALPRAZolam (XANAX) 0.5 MG tablet Take 0.5 mg by mouth 3 (three) times daily as needed for anxiety.      Amylase-Lipase-Protease (CREON 10  PO) Take by mouth.     busPIRone (BUSPAR) 10 MG tablet 1 tablet     CALCIUM PO Take 1 tablet by mouth every morning.     Dextran 70-Hypromellose (ARTIFICIAL TEARS) 0.1-0.3 % SOLN 1 drop into both eyes     diclofenac Sodium (VOLTAREN) 1 % GEL See admin instructions.     lamoTRIgine (LAMICTAL) 25 MG tablet Take 25 mg by mouth every morning.     Multiple Vitamins-Minerals (CENTRUM SILVER 50+WOMEN PO) 1 tablet     Pancrelipase, Lip-Prot-Amyl, (CREON PO) 2 capsules with each meal and 1 capsule with each snack     pregabalin (LYRICA) 75 MG capsule Take 1 capsule (75 mg total) by mouth 2 (two) times daily. 60 capsule 2   primidone (MYSOLINE) 50 MG tablet Take 1 tablet (50 mg total) by mouth at bedtime. 60 tablet 3   valACYclovir (VALTREX) 500 MG tablet Take 500 mg by mouth daily.     vitamin C (ASCORBIC ACID) 500 MG tablet Take 1,000 mg by mouth every morning.     No current facility-administered medications on file prior to visit.    Allergies:  Allergies  Allergen Reactions   Doxycycline Swelling   Prednisone Other (See Comments)    Very emotional, crying, angry   Penicillins Rash    Has patient had a PCN reaction causing immediate rash, facial/tongue/throat swelling, SOB or lightheadedness with hypotension: yes Has patient had a PCN reaction causing severe rash involving mucus membranes or skin necrosis: no Has patient had a PCN reaction that required  hospitalization: no Has patient had a PCN reaction occurring within the last 10 years: no If all of the above answers are "NO", then may proceed with Cephalosporin use.    Sulfonamide Derivatives Rash   Past Medical History:  Past Medical History:  Diagnosis Date   Abnormal chest x-ray 2005   COPD changes (2005) + interstitial lung dz changes (2002)   Anxiety and depression    Prozac, celexa, paxil, wellbutrin, and buspar all failures.  Pt has stated xanax is only med that helps.   Tobacco dependence    Past Surgical History:  Past  Surgical History:  Procedure Laterality Date   ABDOMINAL HYSTERECTOMY  1979   APPENDECTOMY  1966   Social History:  Social History   Socioeconomic History   Marital status: Single    Spouse name: Not on file   Number of children: Not on file   Years of education: Not on file   Highest education level: Not on file  Occupational History   Not on file  Tobacco Use   Smoking status: Every Day    Packs/day: 0.50    Years: 55.00    Pack years: 27.50    Types: Cigarettes   Smokeless tobacco: Never  Substance and Sexual Activity   Alcohol use: No   Drug use: No   Sexual activity: Not on file  Other Topics Concern   Not on file  Social History Narrative   Not on file   Social Determinants of Health   Financial Resource Strain: Not on file  Food Insecurity: Not on file  Transportation Needs: Not on file  Physical Activity: Not on file  Stress: Not on file  Social Connections: Not on file  Intimate Partner Violence: Not on file   Family History:  Family History  Problem Relation Age of Onset   Diabetes Other    Suicidality Father    Heart disease Brother     Review of Systems: Constitutional: Doesn't report fevers, chills or abnormal weight loss Eyes: Doesn't report blurriness of vision Ears, nose, mouth, throat, and face: Doesn't report sore throat Respiratory: Doesn't report cough, dyspnea or wheezes Cardiovascular: Doesn't report palpitation, chest discomfort  Gastrointestinal:  Doesn't report nausea, constipation, diarrhea GU: Doesn't report incontinence Skin: Doesn't report skin rashes Neurological: Per HPI Musculoskeletal: Doesn't report joint pain Behavioral/Psych: Doesn't report anxiety  Physical Exam: Vitals:   12/24/20 1243  BP: 130/74  Pulse: 71  Resp: 18  Temp: 98.6 F (37 C)  SpO2: 99%    KPS: 80. General: Alert, cooperative, pleasant, in no acute distress Head: Normal EENT: No conjunctival injection or scleral icterus.  Lungs: Resp  effort normal Cardiac: Regular rate Abdomen: Non-distended abdomen Skin: No rashes cyanosis or petechiae. Extremities: No clubbing or edema  Neurologic Exam: Mental Status: Awake, alert, attentive to examiner. Oriented to self and environment. Language is fluent with intact comprehension.  Cranial Nerves: Visual acuity is grossly normal. Visual fields are full. Extra-ocular movements intact. No ptosis. Face is symmetric Motor: Tone and bulk are normal. Power is impaired distally with regards to fine motor in hands. Reflexes are trace or absent, no pathologic reflexes present.  Sensory: Impaired in stocking pattern Gait: Normal.   Labs: I have reviewed the data as listed    Component Value Date/Time   NA 141 10/05/2020 1350   NA 139 11/24/2019 1527   K 3.5 10/05/2020 1350   CL 100 10/05/2020 1350   CO2 28 10/05/2020 1350   GLUCOSE 72 10/05/2020  1350   BUN 10 10/05/2020 1350   BUN 13 11/24/2019 1527   CREATININE 0.70 10/05/2020 1350   CALCIUM 9.1 10/05/2020 1350   PROT 7.6 10/05/2020 1350   PROT 7.1 11/24/2019 1527   ALBUMIN 3.8 10/05/2020 1350   ALBUMIN 4.3 11/24/2019 1527   AST 18 10/05/2020 1350   ALT 12 10/05/2020 1350   ALKPHOS 87 10/05/2020 1350   BILITOT 0.2 (L) 10/05/2020 1350   GFRNONAA >60 10/05/2020 1350   GFRAA >60 01/26/2020 1451   Lab Results  Component Value Date   WBC 11.0 (H) 10/05/2020   NEUTROABS 6.0 10/05/2020   HGB 12.4 10/05/2020   HCT 38.9 10/05/2020   MCV 91.7 10/05/2020   PLT 432 (H) 10/05/2020   NCS (NERVE CONDUCTION STUDY) WITH EMG (ELECTROMYOGRAPHY) REPORT     STUDY DATE: 12/12/20 PATIENT NAME: Molly Welch DOB: 03-01-1946 MRN: [id number]   ORDERING CLINICIAN: Merrilyn Puma, MD   TECHNOLOGIST: Sherre Scarlet ELECTROMYOGRAPHER: Earlean Polka. Penumalli, MD   CLINICAL INFORMATION: 75 year old female with numbness and burning.   FINDINGS: NERVE CONDUCTION STUDY:   Bilateral median, right ulnar, right peroneal and right tibial motor responses are  normal.   Right sural, right superficial peroneal, right ulnar, left median sensory responses are normal.   Right median sensory response has slightly prolonged peak latency and normal amplitude.   Right tibial and right ulnar F-wave latencies are normal.     NEEDLE ELECTROMYOGRAPHY:   Needle examination of right upper extremity is normal.     IMPRESSION:    This study demonstrates: -Very mild right median sensory neuropathy. -No underlying evidence of widespread large fiber neuropathy. -Compared to prior study in 2021, no evidence of progression of neuropathy or motor neuropathy. Prior study demonstrated diffuse sensory axonal neuropathy which is not evident on current study.   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  Workup did not demonstrate picture consistent with inflammatory polyneuropathy.  That said, MAG associated neuropathy is still an appropriate diagnosis given her clinical course and laboratory findings.    We recommended a trial of cymbalta, 30mg  BID, as third line agent.  For tremor will con't primidone 50mg  HS, with an assist from Alprazolam, currently dosing 0.5mg  TID.   We appreciate the opportunity to participate in the care of Molly Welch.  We will giver her a call in ~2 months to assess response to therapy changes today.  All questions were answered. The patient knows to call the clinic with any problems, questions or concerns. No barriers to learning were detected.  The total time spent in the encounter was 30 minutes and more than 50% was on counseling and review of test results   Ventura Sellers, MD Medical Director of Neuro-Oncology [facility] at [place] 12/24/20 12:37 PM

## 2020-12-26 DIAGNOSIS — R110 Nausea: Secondary | ICD-10-CM | POA: Diagnosis not present

## 2021-01-14 ENCOUNTER — Telehealth: Payer: Self-pay

## 2021-01-14 NOTE — Telephone Encounter (Signed)
Pt called stating she started Cymbalta last month. Beginning yesterday 01/13/21 she has begun to have chills, low back and abdominal pain. Pt wants to know if this is related to the Cymbalta.  I have confirmed with Dr. Mickeal Skinner these are not side effects of Cymbalta. Pt has been advised of this and expressed understanding of this information. Pt was advised if she has worsening sx, please contact her PCP. Pt was also encouraged to check her temp and get COVID tested.

## 2021-01-15 DIAGNOSIS — Z20822 Contact with and (suspected) exposure to covid-19: Secondary | ICD-10-CM | POA: Diagnosis not present

## 2021-01-15 DIAGNOSIS — R5383 Other fatigue: Secondary | ICD-10-CM | POA: Diagnosis not present

## 2021-01-15 DIAGNOSIS — R42 Dizziness and giddiness: Secondary | ICD-10-CM | POA: Diagnosis not present

## 2021-01-15 DIAGNOSIS — R109 Unspecified abdominal pain: Secondary | ICD-10-CM | POA: Diagnosis not present

## 2021-01-26 DIAGNOSIS — S63502A Unspecified sprain of left wrist, initial encounter: Secondary | ICD-10-CM | POA: Diagnosis not present

## 2021-02-09 DIAGNOSIS — R197 Diarrhea, unspecified: Secondary | ICD-10-CM | POA: Diagnosis not present

## 2021-02-13 DIAGNOSIS — G250 Essential tremor: Secondary | ICD-10-CM | POA: Diagnosis not present

## 2021-02-13 DIAGNOSIS — R197 Diarrhea, unspecified: Secondary | ICD-10-CM | POA: Diagnosis not present

## 2021-02-18 ENCOUNTER — Telehealth: Payer: Self-pay | Admitting: *Deleted

## 2021-02-18 ENCOUNTER — Inpatient Hospital Stay: Payer: Medicare Other | Attending: Hematology and Oncology | Admitting: Internal Medicine

## 2021-02-18 ENCOUNTER — Other Ambulatory Visit: Payer: Self-pay

## 2021-02-18 DIAGNOSIS — D472 Monoclonal gammopathy: Secondary | ICD-10-CM | POA: Diagnosis not present

## 2021-02-18 DIAGNOSIS — G629 Polyneuropathy, unspecified: Secondary | ICD-10-CM | POA: Diagnosis not present

## 2021-02-18 MED ORDER — AMITRIPTYLINE HCL 50 MG PO TABS
50.0000 mg | ORAL_TABLET | Freq: Every day | ORAL | 3 refills | Status: DC
Start: 2021-02-18 — End: 2021-03-27

## 2021-02-18 NOTE — Progress Notes (Signed)
I connected with Molly Welch on 02/18/21 at 12:00 PM EDT by telephone visit and verified that I am speaking with the correct person using two identifiers.  I discussed the limitations, risks, security and privacy concerns of performing an evaluation and management service by telemedicine and the availability of in-person appointments. I also discussed with the patient that there may be a patient responsible charge related to this service. The patient expressed understanding and agreed to proceed.  Other persons participating in the visit and their role in the encounter:  n/a  Patient's location:  Home  Provider's location:  Office  Chief Complaint:  Neuropathy associated with benign monoclonal gammopathy  History of Present Ilness: Molly Welch describes no improvement in neuropathy at all with Cymbalta.  She experienced side effects, dizziness and diarrhea.  Otherwise no new/novel complaints, not currently dosing anything for neuropathic pain.  Observations: Language and cognition at baseline  Assessment and Plan: Neuropathy associated with benign monoclonal gammopathy  Recommended discontinuation of Cymbalta, trial of Elavil 50mg  HS.  Counseled on side effects including dry mouth, constipation, sedation.  Follow Up Instructions: RTC in 1 month for eval  I discussed the assessment and treatment plan with the patient.  The patient was provided an opportunity to ask questions and all were answered.  The patient agreed with the plan and demonstrated understanding of the instructions.    The patient was advised to call back or seek an in-person evaluation if the symptoms worsen or if the condition fails to improve as anticipated.  I provided 5-10 minutes of non-face-to-face time during this enocunter.  Ventura Sellers, MD   I provided 15 minutes of non face-to-face telephone visit time during this encounter, and > 50% was spent counseling as documented under my assessment & plan.

## 2021-02-18 NOTE — Telephone Encounter (Signed)
Returned PC to patient, she left VM asking what her new prescription for elavil was for.  Explained to patient that, per Dr. Renda Rolls note today, she had no improvement in her neuropathy from cymbalta so he is now prescribing elavil.  Instructed patient to stop taking the cymbalta & to start on the elvavil 50 mg at bedtime.  Patient verbalizes understanding.

## 2021-02-22 DIAGNOSIS — H0100A Unspecified blepharitis right eye, upper and lower eyelids: Secondary | ICD-10-CM | POA: Diagnosis not present

## 2021-02-27 ENCOUNTER — Telehealth: Payer: Self-pay | Admitting: *Deleted

## 2021-02-27 NOTE — Telephone Encounter (Signed)
Contacted patient  - left Dr. Renda Rolls response on named VM: She can stop it... side effect should be constipation so it's not causing that symptom.   Encouraged patient to f/u with PCP if symptoms continue. Also encouraged to contact Dr. Renda Rolls office for further questions or concerns.

## 2021-02-27 NOTE — Telephone Encounter (Signed)
Patient called. States Dr. Mickeal Skinner prescribed Amitriptyline for her last week. She wants to talk with him about it as she has stopped it due to following side effects: Gait is unsteady - difficulty walking; Diarrhea - uncontrolled and severe (wearing pads); Loss of appetite; Nausea. States she had to leave work early today due to diarrhea and nausea.   Patient informed that Dr. Mickeal Skinner out of office today. Call information will be routed to him for his review and she will be contacted when he responds. She verbalized understanding and stated she will continue holding med until she speaks with him..   Call information/message routed to Dr. Mickeal Skinner

## 2021-03-08 ENCOUNTER — Encounter: Payer: Self-pay | Admitting: Neurology

## 2021-03-23 ENCOUNTER — Other Ambulatory Visit: Payer: Self-pay

## 2021-03-23 ENCOUNTER — Emergency Department (HOSPITAL_BASED_OUTPATIENT_CLINIC_OR_DEPARTMENT_OTHER): Payer: Medicare Other

## 2021-03-23 ENCOUNTER — Inpatient Hospital Stay (HOSPITAL_BASED_OUTPATIENT_CLINIC_OR_DEPARTMENT_OTHER)
Admission: EM | Admit: 2021-03-23 | Discharge: 2021-03-27 | DRG: 182 | Disposition: A | Discharge status: Home / Self Care | Payer: Medicare Other | Attending: Internal Medicine | Admitting: Internal Medicine

## 2021-03-23 ENCOUNTER — Observation Stay (HOSPITAL_COMMUNITY): Payer: Medicare Other

## 2021-03-23 ENCOUNTER — Encounter (HOSPITAL_BASED_OUTPATIENT_CLINIC_OR_DEPARTMENT_OTHER): Payer: Self-pay | Admitting: Pharmacy Technician

## 2021-03-23 DIAGNOSIS — I700 Atherosclerosis of aorta: Secondary | ICD-10-CM | POA: Diagnosis not present

## 2021-03-23 DIAGNOSIS — Z720 Tobacco use: Secondary | ICD-10-CM | POA: Diagnosis not present

## 2021-03-23 DIAGNOSIS — W1830XA Fall on same level, unspecified, initial encounter: Secondary | ICD-10-CM | POA: Diagnosis present

## 2021-03-23 DIAGNOSIS — Z79899 Other long term (current) drug therapy: Secondary | ICD-10-CM | POA: Diagnosis not present

## 2021-03-23 DIAGNOSIS — R112 Nausea with vomiting, unspecified: Secondary | ICD-10-CM | POA: Diagnosis not present

## 2021-03-23 DIAGNOSIS — Z888 Allergy status to other drugs, medicaments and biological substances status: Secondary | ICD-10-CM

## 2021-03-23 DIAGNOSIS — F17210 Nicotine dependence, cigarettes, uncomplicated: Secondary | ICD-10-CM | POA: Diagnosis not present

## 2021-03-23 DIAGNOSIS — Z961 Presence of intraocular lens: Secondary | ICD-10-CM | POA: Diagnosis present

## 2021-03-23 DIAGNOSIS — R251 Tremor, unspecified: Secondary | ICD-10-CM | POA: Diagnosis not present

## 2021-03-23 DIAGNOSIS — R54 Age-related physical debility: Secondary | ICD-10-CM | POA: Diagnosis present

## 2021-03-23 DIAGNOSIS — F419 Anxiety disorder, unspecified: Secondary | ICD-10-CM | POA: Diagnosis present

## 2021-03-23 DIAGNOSIS — W2203XA Walked into furniture, initial encounter: Secondary | ICD-10-CM | POA: Diagnosis present

## 2021-03-23 DIAGNOSIS — Z8249 Family history of ischemic heart disease and other diseases of the circulatory system: Secondary | ICD-10-CM | POA: Diagnosis not present

## 2021-03-23 DIAGNOSIS — J8410 Pulmonary fibrosis, unspecified: Secondary | ICD-10-CM | POA: Diagnosis not present

## 2021-03-23 DIAGNOSIS — J439 Emphysema, unspecified: Secondary | ICD-10-CM | POA: Diagnosis not present

## 2021-03-23 DIAGNOSIS — Z6821 Body mass index (BMI) 21.0-21.9, adult: Secondary | ICD-10-CM

## 2021-03-23 DIAGNOSIS — R0789 Other chest pain: Secondary | ICD-10-CM | POA: Diagnosis not present

## 2021-03-23 DIAGNOSIS — Z0389 Encounter for observation for other suspected diseases and conditions ruled out: Secondary | ICD-10-CM | POA: Diagnosis not present

## 2021-03-23 DIAGNOSIS — R42 Dizziness and giddiness: Secondary | ICD-10-CM

## 2021-03-23 DIAGNOSIS — G629 Polyneuropathy, unspecified: Secondary | ICD-10-CM | POA: Diagnosis present

## 2021-03-23 DIAGNOSIS — R5381 Other malaise: Secondary | ICD-10-CM | POA: Diagnosis not present

## 2021-03-23 DIAGNOSIS — J84112 Idiopathic pulmonary fibrosis: Secondary | ICD-10-CM | POA: Diagnosis not present

## 2021-03-23 DIAGNOSIS — Z9889 Other specified postprocedural states: Secondary | ICD-10-CM

## 2021-03-23 DIAGNOSIS — J189 Pneumonia, unspecified organism: Secondary | ICD-10-CM

## 2021-03-23 DIAGNOSIS — R918 Other nonspecific abnormal finding of lung field: Secondary | ICD-10-CM | POA: Diagnosis not present

## 2021-03-23 DIAGNOSIS — R2681 Unsteadiness on feet: Secondary | ICD-10-CM | POA: Diagnosis not present

## 2021-03-23 DIAGNOSIS — R296 Repeated falls: Secondary | ICD-10-CM

## 2021-03-23 DIAGNOSIS — I2510 Atherosclerotic heart disease of native coronary artery without angina pectoris: Secondary | ICD-10-CM | POA: Diagnosis present

## 2021-03-23 DIAGNOSIS — D472 Monoclonal gammopathy: Secondary | ICD-10-CM | POA: Diagnosis present

## 2021-03-23 DIAGNOSIS — J479 Bronchiectasis, uncomplicated: Secondary | ICD-10-CM | POA: Diagnosis not present

## 2021-03-23 DIAGNOSIS — Z833 Family history of diabetes mellitus: Secondary | ICD-10-CM | POA: Diagnosis not present

## 2021-03-23 DIAGNOSIS — C3432 Malignant neoplasm of lower lobe, left bronchus or lung: Secondary | ICD-10-CM | POA: Diagnosis not present

## 2021-03-23 DIAGNOSIS — Z880 Allergy status to penicillin: Secondary | ICD-10-CM

## 2021-03-23 DIAGNOSIS — Z20822 Contact with and (suspected) exposure to covid-19: Secondary | ICD-10-CM | POA: Diagnosis not present

## 2021-03-23 DIAGNOSIS — Z881 Allergy status to other antibiotic agents status: Secondary | ICD-10-CM | POA: Diagnosis not present

## 2021-03-23 DIAGNOSIS — Z66 Do not resuscitate: Secondary | ICD-10-CM | POA: Diagnosis present

## 2021-03-23 DIAGNOSIS — J432 Centrilobular emphysema: Secondary | ICD-10-CM | POA: Diagnosis not present

## 2021-03-23 DIAGNOSIS — R9389 Abnormal findings on diagnostic imaging of other specified body structures: Secondary | ICD-10-CM | POA: Diagnosis not present

## 2021-03-23 DIAGNOSIS — F32A Depression, unspecified: Secondary | ICD-10-CM | POA: Diagnosis present

## 2021-03-23 DIAGNOSIS — S0990XA Unspecified injury of head, initial encounter: Secondary | ICD-10-CM | POA: Diagnosis not present

## 2021-03-23 DIAGNOSIS — C3490 Malignant neoplasm of unspecified part of unspecified bronchus or lung: Secondary | ICD-10-CM | POA: Diagnosis not present

## 2021-03-23 LAB — COMPREHENSIVE METABOLIC PANEL
ALT: 10 U/L (ref 0–44)
AST: 17 U/L (ref 15–41)
Albumin: 3.3 g/dL — ABNORMAL LOW (ref 3.5–5.0)
Alkaline Phosphatase: 84 U/L (ref 38–126)
Anion gap: 8 (ref 5–15)
BUN: 9 mg/dL (ref 8–23)
CO2: 28 mmol/L (ref 22–32)
Calcium: 9 mg/dL (ref 8.9–10.3)
Chloride: 103 mmol/L (ref 98–111)
Creatinine, Ser: 0.58 mg/dL (ref 0.44–1.00)
GFR, Estimated: >60 mL/min (ref >60)
Glucose, Bld: 93 mg/dL (ref 70–99)
Potassium: 3.8 mmol/L (ref 3.5–5.1)
Sodium: 139 mmol/L (ref 135–145)
Total Bilirubin: 0.6 mg/dL (ref 0.3–1.2)
Total Protein: 7.1 g/dL (ref 6.5–8.1)

## 2021-03-23 LAB — CBC WITH DIFFERENTIAL/PLATELET
Abs Immature Granulocytes: 0.04 10*3/uL (ref 0.00–0.07)
Basophils Absolute: 0.1 10*3/uL (ref 0.0–0.1)
Basophils Relative: 1 %
Eosinophils Absolute: 0.1 10*3/uL (ref 0.0–0.5)
Eosinophils Relative: 1 %
HCT: 37.5 % (ref 36.0–46.0)
Hemoglobin: 12 g/dL (ref 12.0–15.0)
Immature Granulocytes: 0 %
Lymphocytes Relative: 20 %
Lymphs Abs: 2 10*3/uL (ref 0.7–4.0)
MCH: 27.8 pg (ref 26.0–34.0)
MCHC: 32 g/dL (ref 30.0–36.0)
MCV: 87 fL (ref 80.0–100.0)
Monocytes Absolute: 0.9 10*3/uL (ref 0.1–1.0)
Monocytes Relative: 9 %
Neutro Abs: 6.9 10*3/uL (ref 1.7–7.7)
Neutrophils Relative %: 69 %
Platelets: 474 10*3/uL — ABNORMAL HIGH (ref 150–400)
RBC: 4.31 MIL/uL (ref 3.87–5.11)
RDW: 13.2 % (ref 11.5–15.5)
WBC: 9.9 10*3/uL (ref 4.0–10.5)
nRBC: 0 % (ref 0.0–0.2)

## 2021-03-23 LAB — URINALYSIS, ROUTINE W REFLEX MICROSCOPIC
Bilirubin Urine: NEGATIVE
Glucose, UA: NEGATIVE mg/dL
Hgb urine dipstick: NEGATIVE
Ketones, ur: NEGATIVE mg/dL
Leukocytes,Ua: NEGATIVE
Nitrite: NEGATIVE
Protein, ur: NEGATIVE mg/dL
Specific Gravity, Urine: 1.015 (ref 1.005–1.030)
pH: 6.5 (ref 5.0–8.0)

## 2021-03-23 LAB — TROPONIN I (HIGH SENSITIVITY)
Troponin I (High Sensitivity): 6 ng/L (ref <18)
Troponin I (High Sensitivity): 7 ng/L (ref <18)

## 2021-03-23 LAB — RESP PANEL BY RT-PCR (FLU A&B, COVID) ARPGX2
Influenza A by PCR: NEGATIVE
Influenza B by PCR: NEGATIVE
SARS Coronavirus 2 by RT PCR: NEGATIVE

## 2021-03-23 LAB — CBG MONITORING, ED: Glucose-Capillary: 90 mg/dL (ref 70–99)

## 2021-03-23 LAB — PHOSPHORUS: Phosphorus: 3.4 mg/dL (ref 2.5–4.6)

## 2021-03-23 LAB — MAGNESIUM: Magnesium: 1.9 mg/dL (ref 1.7–2.4)

## 2021-03-23 MED ORDER — ONDANSETRON HCL 4 MG PO TABS
4.0000 mg | ORAL_TABLET | Freq: Four times a day (QID) | ORAL | Status: DC | PRN
  Administered 2021-03-25: 4 mg via ORAL
  Filled 2021-03-23: qty 1

## 2021-03-23 MED ORDER — ONDANSETRON 4 MG PO TBDP
4.0000 mg | ORAL_TABLET | Freq: Once | ORAL | Status: AC
  Administered 2021-03-23: 4 mg via ORAL
  Filled 2021-03-23: qty 1

## 2021-03-23 MED ORDER — ACETAMINOPHEN 325 MG PO TABS
650.0000 mg | ORAL_TABLET | Freq: Four times a day (QID) | ORAL | Status: DC | PRN
  Administered 2021-03-24 – 2021-03-25 (×3): 650 mg via ORAL
  Filled 2021-03-23 (×3): qty 2

## 2021-03-23 MED ORDER — SODIUM CHLORIDE 0.9 % IV SOLN
75.0000 mL/h | INTRAVENOUS | Status: AC
  Administered 2021-03-23: 75 mL/h via INTRAVENOUS

## 2021-03-23 MED ORDER — SODIUM CHLORIDE 0.9 % IV SOLN
500.0000 mg | INTRAVENOUS | Status: DC
  Filled 2021-03-23: qty 500

## 2021-03-23 MED ORDER — VALACYCLOVIR HCL 500 MG PO TABS
500.0000 mg | ORAL_TABLET | Freq: Every day | ORAL | Status: DC
  Administered 2021-03-23 – 2021-03-27 (×5): 500 mg via ORAL
  Filled 2021-03-23 (×6): qty 1

## 2021-03-23 MED ORDER — ACETAMINOPHEN 650 MG RE SUPP: 650.0000 mg | Freq: Four times a day (QID) | RECTAL | Status: DC | PRN

## 2021-03-23 MED ORDER — LAMOTRIGINE 25 MG PO TABS
25.0000 mg | ORAL_TABLET | ORAL | Status: DC
  Administered 2021-03-24 – 2021-03-27 (×4): 25 mg via ORAL
  Filled 2021-03-23 (×4): qty 1

## 2021-03-23 MED ORDER — HYDROCODONE-ACETAMINOPHEN 5-325 MG PO TABS
1.0000 | ORAL_TABLET | ORAL | Status: DC | PRN
  Administered 2021-03-24: 1 via ORAL
  Filled 2021-03-23 (×2): qty 1

## 2021-03-23 MED ORDER — MECLIZINE HCL 25 MG PO TABS
12.5000 mg | ORAL_TABLET | Freq: Once | ORAL | Status: AC
  Administered 2021-03-23: 12.5 mg via ORAL
  Filled 2021-03-23: qty 1

## 2021-03-23 MED ORDER — ONDANSETRON HCL 4 MG/2ML IJ SOLN
4.0000 mg | Freq: Four times a day (QID) | INTRAMUSCULAR | Status: DC | PRN
  Administered 2021-03-24: 4 mg via INTRAVENOUS
  Filled 2021-03-23: qty 2

## 2021-03-23 MED ORDER — GUAIFENESIN ER 600 MG PO TB12
600.0000 mg | ORAL_TABLET | Freq: Two times a day (BID) | ORAL | Status: DC
  Administered 2021-03-23 – 2021-03-27 (×8): 600 mg via ORAL
  Filled 2021-03-23 (×8): qty 1

## 2021-03-23 MED ORDER — PANCRELIPASE (LIP-PROT-AMYL) 36000-114000 UNITS PO CPEP
72000.0000 [IU] | ORAL_CAPSULE | Freq: Three times a day (TID) | ORAL | Status: DC
  Administered 2021-03-24 – 2021-03-27 (×8): 72000 [IU] via ORAL
  Filled 2021-03-23 (×11): qty 2

## 2021-03-23 MED ORDER — IOHEXOL 350 MG/ML SOLN
80.0000 mL | Freq: Once | INTRAVENOUS | Status: AC | PRN
  Administered 2021-03-23: 80 mL via INTRAVENOUS

## 2021-03-23 MED ORDER — SODIUM CHLORIDE 0.9 % IV SOLN
500.0000 mg | Freq: Once | INTRAVENOUS | Status: AC
  Administered 2021-03-23: 500 mg via INTRAVENOUS
  Filled 2021-03-23: qty 500

## 2021-03-23 MED ORDER — ALBUTEROL SULFATE (2.5 MG/3ML) 0.083% IN NEBU: 2.5000 mg | INHALATION_SOLUTION | RESPIRATORY_TRACT | Status: DC | PRN

## 2021-03-23 MED ORDER — ALPRAZOLAM 0.5 MG PO TABS: 0.5000 mg | ORAL_TABLET | Freq: Three times a day (TID) | ORAL | Status: DC | PRN

## 2021-03-23 MED ORDER — NICOTINE 14 MG/24HR TD PT24
14.0000 mg | MEDICATED_PATCH | Freq: Every day | TRANSDERMAL | Status: DC
  Administered 2021-03-23 – 2021-03-26 (×4): 14 mg via TRANSDERMAL
  Filled 2021-03-23 (×5): qty 1

## 2021-03-23 MED ORDER — LACTATED RINGERS IV BOLUS
1000.0000 mL | Freq: Once | INTRAVENOUS | Status: AC
  Administered 2021-03-23: 1000 mL via INTRAVENOUS

## 2021-03-23 MED ORDER — SODIUM CHLORIDE 0.9 % IV SOLN
1.0000 g | Freq: Once | INTRAVENOUS | Status: AC
  Administered 2021-03-23: 1 g via INTRAVENOUS
  Filled 2021-03-23: qty 10

## 2021-03-23 MED ORDER — SODIUM CHLORIDE 0.9 % IV SOLN
2.0000 g | INTRAVENOUS | Status: DC
  Administered 2021-03-24: 2 g via INTRAVENOUS
  Filled 2021-03-23: qty 20

## 2021-03-23 MED ORDER — MECLIZINE HCL 25 MG PO TABS: 25.0000 mg | ORAL_TABLET | Freq: Two times a day (BID) | ORAL | Status: DC | PRN

## 2021-03-23 NOTE — ED Provider Notes (Signed)
[facility] Provider Note   CSN: [id number] Arrival date & time: 03/23/21  9323     History Chief Complaint  Patient presents with   Fall   Dizziness   Chest Pain    Molly Welch is a 75 y.o. female.  Patient presents today for fall. Patient states that she felt weak and dizzy this morning and subsequently fell on the way to her kitchen. The first fall the patient was able to grab her dresser on the way to the ground, however states she did hit her head on the carpet. The second was a fall into a chair without injury. Patient states that over the past few months she has been experiencing worsening tremors and some dizziness for which she has an appointment to see neurology next week. However, patient is normally able to ambulate without assistance but cannot today. Also endorses some chest pain this morning in the center of her chest that was not pleuritic in nature and did not radiate. Same has now subsided. Denies fevers, chills, weight changes, shortness of breath, leg swelling.  Of note, patient smokes half a pack a day.  The history is provided by the patient. No language interpreter was used.  Fall Associated symptoms include chest pain and shortness of breath. Pertinent negatives include no abdominal pain and no headaches.  Dizziness Associated symptoms: chest pain, nausea, shortness of breath and weakness   Associated symptoms: no blood in stool, no headaches, no palpitations and no vomiting   Chest Pain Associated symptoms: cough, dizziness, nausea, shortness of breath and weakness   Associated symptoms: no abdominal pain, no back pain, no diaphoresis, no fever, no headache, no numbness, no palpitations and no vomiting       Past Medical History:  Diagnosis Date   Abnormal chest x-ray 2005   COPD changes (2005) + interstitial lung dz changes (2002)   Anxiety and depression    Prozac, celexa, paxil, wellbutrin, and buspar all failures.   Pt has stated xanax is only med that helps.   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     Family History  Problem Relation Age of Onset   Diabetes Other    Suicidality Father    Heart disease Brother     Social History   Tobacco Use   Smoking status: Every Day    Packs/day: 0.50    Years: 55.00    Pack years: 27.50    Types: Cigarettes   Smokeless tobacco: Never  Substance Use Topics   Alcohol use: No   Drug use: No    Home Medications Prior to Admission medications   Medication Sig Start Date End Date Taking? Authorizing Provider  ALPRAZolam Duanne Moron) 0.5 MG tablet Take 0.5 mg by mouth 3 (three) times daily as needed for anxiety.  03/20/16   [provider]  amitriptyline (ELAVIL) 50 MG tablet Take 1 tablet (50 mg total) by mouth at bedtime. 02/18/21   Ventura Sellers, MD  busPIRone (BUSPAR) 10 MG tablet 1 tablet 07/12/19   [provider]  CALCIUM  PO Take 1 tablet by mouth every morning.    [provider]  Dextran 70-Hypromellose (ARTIFICIAL TEARS) 0.1-0.3 % SOLN 1 drop into both eyes 10/16/17   [provider]  diclofenac Sodium (VOLTAREN) 1 % GEL See admin instructions.    [provider]  DULoxetine (CYMBALTA) 30 MG capsule Take 1 capsule (30 mg total) by mouth daily. 12/24/20   Ventura Sellers, MD  lamoTRIgine (LAMICTAL) 25 MG tablet Take 25 mg by mouth every morning. 05/01/16   [provider]  Multiple Vitamins-Minerals (CENTRUM SILVER 50+WOMEN PO) 1 tablet    [provider]  Pancrelipase, Lip-Prot-Amyl, (CREON PO) 2 capsules with each  meal and 1 capsule with each snack 09/20/18   [provider]  primidone (MYSOLINE) 50 MG tablet Take 1 tablet (50 mg total) by mouth at bedtime. 10/18/20   Ventura Sellers, MD  valACYclovir (VALTREX) 500 MG tablet Take 500 mg by mouth daily. Patient not taking: Reported on 12/24/2020    [provider]  vitamin C (ASCORBIC ACID) 500 MG tablet Take 1,000 mg by mouth every morning.    [provider]    Allergies    Doxycycline, Prednisone, Penicillins, and Sulfonamide derivatives  Review of Systems   Review of Systems  Constitutional:  Negative for appetite change, chills, diaphoresis, fever and unexpected weight change.  HENT:  Negative for congestion, postnasal drip and rhinorrhea.   Respiratory:  Positive for cough and shortness of breath. Negative for chest tightness.   Cardiovascular:  Positive for chest pain. Negative for palpitations and leg swelling.  Gastrointestinal:  Positive for nausea. Negative for abdominal distention, abdominal pain, blood in stool, constipation and vomiting.  Genitourinary:  Negative for difficulty urinating, dysuria and urgency.  Musculoskeletal:  Negative for back pain, joint swelling, neck pain and neck stiffness.  Neurological:  Positive for dizziness, tremors, weakness and light-headedness. Negative for seizures, syncope, facial asymmetry, speech difficulty, numbness and headaches.  Psychiatric/Behavioral:  Negative for behavioral problems and confusion.   All other systems reviewed and are negative.  Physical Exam Updated Vital Signs BP 111/75   Pulse 73   Resp (!) 22   SpO2 97%   Physical Exam Vitals and nursing note reviewed.  Constitutional:      General: She is not in acute distress.    Appearance: She is well-developed and normal weight. She is not ill-appearing, toxic-appearing or diaphoretic.  HENT:     Head: Normocephalic and atraumatic.  Cardiovascular:     Rate and Rhythm: Normal rate and regular rhythm.      Heart sounds: Normal heart sounds.  Pulmonary:     Effort: Pulmonary effort is normal.     Breath sounds: Examination of the left-lower field reveals decreased breath sounds and rales. Decreased breath sounds and rales present. No wheezing or rhonchi.  Chest:     Chest wall: No tenderness.  Abdominal:     General: Bowel sounds are normal.     Palpations: Abdomen is soft.  Musculoskeletal:        General: Normal range of motion.     Cervical back: Normal range of motion and neck supple.     Right lower leg: No tenderness. No edema.     Left lower leg: No tenderness. No edema.  Skin:    General: Skin is warm and dry.  Neurological:     General: No focal deficit present.     Mental Status: She is alert and oriented to person, place, and  time.     GCS: GCS eye subscore is 4. GCS verbal subscore is 5. GCS motor subscore is 6.     Cranial Nerves: Cranial nerves are intact. No cranial nerve deficit, dysarthria or facial asymmetry.     Sensory: Sensation is intact.     Motor: Tremor present. No weakness, atrophy, abnormal muscle tone, seizure activity or pronator drift.     Coordination: Coordination is intact. Romberg sign negative. Coordination normal. Finger-Nose-Finger Test normal.  Psychiatric:        Mood and Affect: Mood normal.        Behavior: Behavior normal.    ED Results / Procedures / Treatments   Labs (all labs ordered are listed, but only abnormal results are displayed) Labs Reviewed  COMPREHENSIVE METABOLIC PANEL  CBC WITH DIFFERENTIAL/PLATELET  URINALYSIS, ROUTINE W REFLEX MICROSCOPIC  CBG MONITORING, ED  TROPONIN I (HIGH SENSITIVITY)    EKG EKG Interpretation  Date/Time:  Saturday March 23 2021 10:02:43 EDT Ventricular Rate:  73 PR Interval:  139 QRS Duration: 92 QT Interval:  413 QTC Calculation: 456 R Axis:   -15 Text Interpretation: Sinus rhythm Consider left ventricular hypertrophy No significant change since last tracing Confirmed by Calvert Cantor [phone]) on 03/23/2021 10:22:16 AM  Radiology No results found.  Procedures Procedures   Medications Ordered in ED Medications - No data to display  ED Course  I have reviewed the triage vital signs and the nursing notes.  Pertinent labs & imaging results that were available during my care of the patient were reviewed by me and considered in my medical decision making (see chart for details).    MDM Rules/Calculators/A&P                         Patient presents today for fall x2 after feeling progressively weak and dizzy over the past few days.  Has history of dizziness and tremors over the last 6 months, however symptoms have worsened today.  Low mechanism for both falls, patient did hit her head but denies injury.  Denies hearing loss or rashes.   Ddx: The emergent differential diagnosis for acute vertigo low includes peripheral causes such as BPPV, Mnire's disease, infectious causes such as lip bronchitis, vestibular neuritis or Ramsay Hunt syndrome.  Other emergent causes are central such as cerebellar stroke, vertebrobasilar insufficiency, neoplastic causes, vertebral artery dissection, tuberculosis, epilepsy or migraine.  Other causes include anemia, hyperviscosity syndrome, alcohol or aminoglycoside use, renal failure, hypoglycemia and thyroid disease.   Additional history obtained:  Additional history obtained from chart review & nursing note review.   Lab Tests:  I Ordered, reviewed, and interpreted labs, which included:  Troponin, CMP, CBC, UA, COVID Labs unremarkable, troponins flat and negative.  No leukocytosis or anemia.  Imaging Studies ordered:  I ordered imaging studies which included CT head, CXR, CT chest, I independently reviewed, formal radiology impression shows:  CT head: No acute changes CXR: Left basilar infiltrate worrisome for pneumonia CT chest: 7.5 x 7.2 cm left lower lobe mass/masslike consolidation which may represent malignancy versus  infection/pneumonia. If patient has no symptoms of infection/pneumonia, then this is worrisome for malignancy.  ED Course:  Patient presents for new worsening dizziness and falls.  Found to have pneumonia on chest x-ray, considering long-term heavy smoking use, patient further evaluated with chest CT which is inconclusive for pneumonia versus malignancy.  Patient notes that she is somewhat short of breath at baseline, associated with COPD.  Has  not noticed a change in breathing or cough from baseline.  Patient will need further evaluation for pneumonia versus malignancy.  Antibiotics initiated for CAP.  Additionally, CT head negative for acute findings.  Patient is unable to ambulate without 2 assist due to increasing dizziness.  She will require transfer and admission for further evaluation of this and MRI of her brain.  Patient given meclizine with minimal improvements.  No nystagmus noted on exam.  Dix-Hallpike does not reproduce symptoms.  Neuro exam and specifically cerebellar testing unremarkable for acute findings.  Patient is stable for transfer at this time, amenable to plan of transfer.  Handoff given to hospitalist who will manage care at [facility]    This is a shared visit with supervising physician Dr. Karle Starch who has independently evaluated patient & provided guidance in evaluation/management/disposition, in agreement with care      Portions of this note were generated with Dragon dictation software. Dictation errors may occur despite best attempts at proofreading.     Final Clinical Impression(s) / ED Diagnoses Final diagnoses:  None    Rx / DC Orders ED Discharge Orders     None        Nestor Lewandowsky 03/23/21 1926    Truddie Hidden, MD 03/24/21 0800

## 2021-03-23 NOTE — Progress Notes (Addendum)
Pt admitted to room 21 from [facility] for further work up from chest xray findings. Report received by Charolette Child.  Pt able to transfer with 1 assist from stretcher to chair. High fall risk d/t off balance. Daughter [name] at bedside. Fall precautions in place. Call bell in reach. Care deferred to [facility].  Provider on call paged upon pt arrival.     03/23/21 1851  Vitals  Temp 98.9 F (37.2 C)  Temp Source Oral  BP 124/85  MAP (mmHg) 96  BP Location Left Arm  BP Method Automatic  Patient Position (if appropriate) Lying  Pulse Rate 79  Pulse Rate Source Monitor  Resp 17  MEWS COLOR  MEWS Score Color Green  Oxygen Therapy  SpO2 97 %  O2 Device Room Air  Pain Assessment  Pain Scale 0-10  Pain Score 0  MEWS Score  MEWS Temp 0  MEWS Systolic 0  MEWS Pulse 0  MEWS RR 0  MEWS LOC 0  MEWS Score 0

## 2021-03-23 NOTE — ED Triage Notes (Signed)
Pt here with reports of falling X2 today. Pt endorses trouble with gait for the last few months with worsening today. Pt also endorses chest tightness onset today with intermittent dizziness.

## 2021-03-23 NOTE — ED Notes (Signed)
Transport to [facility] via [facility] .

## 2021-03-23 NOTE — H&P (Signed)
Molly Welch:[id number] DOB: Apr 23, 1946 DOA: 03/23/2021    PCP: Carolee Rota, NP   Outpatient Specialists:      Oncology  Dr.Dorsey and Vaslow    Patient arrived to ER on 03/23/21 at [phone] Referred by Attending Toy Baker, MD   Patient coming from: home Lives alone but family close by    Chief Complaint:   Chief Complaint  Patient presents with   Fall   Dizziness   Chest Pain    HPI: Molly Welch is a 75 y.o. 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 She had a fall while walking to the kitchen.  The first time when she fell down she was able to get her glass dresser on the way down although she did hit her head on the carpet gently.  The second time she fell into a chair and did not injure herself.   She has been having worsening tremors over the past few months and has had her medications adjusted at baseline she is able to walk without assistance.  No fevers or chills no leg swelling does endorse some cough and then nausea and a bit of vomiting as well and just generalized weakness  Patient continues to smoke at least half a pack a day, does not drink  She has significant anxiety and takes Xanax on a regular basis 3 times a day as needed  Family states her gait has been off for a while She supposed to see neurology on Wednesday Dr. 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   COPD - not  followed by pulmonology  not  on baseline oxygen   ,      While in ER: Chest x-ray done showed possible pneumonia when CT scan was done it was inconclusive whether it is pneumonia versus malignancy.  She was started on antibiotics CT head unremarkable Given persistent vertigo patient was given a dose of meclizine but no improvement.  No nystagmus was noted in the ER.   ED Triage Vitals  Enc Vitals Group     BP 03/23/21 1030 111/75     Pulse Rate 03/23/21 1030 73     Resp 03/23/21 1030 (!) 22     Temp 03/23/21 1042 98.1 F (36.7 C)     Temp Source 03/23/21 1042 Oral     SpO2 03/23/21 1030 97 %     Weight --      Height --      Head Circumference --      Peak Flow --      Pain Score 03/23/21 1003 4     Pain Loc --      Pain Edu? --      Excl. in [place]? --   TMAX(24)@     _________________________________________ Significant initial  Findings: Abnormal Labs Reviewed  COMPREHENSIVE METABOLIC PANEL - Abnormal; Notable for the following components:  Result Value   Albumin 3.3 (*)    All other components within normal limits  CBC WITH DIFFERENTIAL/PLATELET - Abnormal; Notable for the following components:   Platelets 474 (*)    All other components within normal limits   ____________________________________________ Ordered CT HEAD   NON acute  CXR - New left basilar infiltrate worrisome for pneumonia. Recommend short-term follow-up imaging after treatment to ensure resolution.   Reticular changes in the periphery of both lungs suggests interstitial lung disease/fibrosis.    CTA chest - no PE 1. 7.5 x 7.2 cm LEFT LOWER lobe mass/masslike consolidation which may represent malignancy versus infection/pneumonia. If patient has no symptoms of infection/pneumonia, then this is worrisome for malignancy.  Pulmonary fibrosis/UIP. CAD possible _________________________ Troponin 7-9 ECG: Ordered Personally reviewed by me showing: HR : 73 Rhythm NSR,    no evidence of  ischemic changes QTC 456 _______  The recent clinical data is shown below. Vitals:   03/23/21 1319 03/23/21 1400 03/23/21 1635 03/23/21 1851  BP: 118/68 129/77 132/76 124/85  Pulse: 80 80 84 79  Resp: 17 19 (!) 24 17  Temp:    98.9 F (37.2 C)  TempSrc:    Oral  SpO2: 96% 98% 94% 97%    WBC     Component Value Date/Time   WBC 9.9 03/23/2021 1111   LYMPHSABS 2.0 03/23/2021 1111   MONOABS 0.9 03/23/2021 1111   EOSABS 0.1 03/23/2021 1111   BASOSABS 0.1 03/23/2021 1111     Procalcitonin  Ordered   UA no evidence of UTI      Urine analysis:    Component Value Date/Time   COLORURINE YELLOW 03/23/2021 1331   APPEARANCEUR CLEAR 03/23/2021 1331   LABSPEC 1.015 03/23/2021 1331   PHURINE 6.5 03/23/2021 1331   GLUCOSEU NEGATIVE 03/23/2021 1331   HGBUR NEGATIVE 03/23/2021 1331   BILIRUBINUR NEGATIVE 03/23/2021 1331   BILIRUBINUR negative 04/30/2013 [place] 03/23/2021 1331   PROTEINUR NEGATIVE 03/23/2021 1331   UROBILINOGEN 0.2 04/30/2013 0947   NITRITE NEGATIVE 03/23/2021 1331   LEUKOCYTESUR NEGATIVE 03/23/2021 1331    Results for orders placed or performed during the hospital encounter of 03/23/21  Resp Panel by RT-PCR (Flu A&B, Covid) Nasopharyngeal Swab     Status: None   Collection Time: 03/23/21  2:40 PM   Specimen: Nasopharyngeal Swab; Nasopharyngeal(NP) swabs in vial transport medium  Result Value Ref Range Status   SARS Coronavirus 2 by RT PCR NEGATIVE NEGATIVE Final         Influenza A by PCR NEGATIVE NEGATIVE Final   Influenza B by PCR NEGATIVE NEGATIVE Final          _______________________________________________ Hospitalist was called for admission for PNA vs malignancy  The following Work up has been ordered so far:  Orders Placed This Encounter  Procedures   Resp Panel by RT-PCR (Flu A&B, Covid) Nasopharyngeal Swab   DG Chest 2 View   CT HEAD WO CONTRAST (5MM)   CT Chest Wo Contrast   Comprehensive metabolic panel   CBC with  Differential/Platelet   Urinalysis, Routine w reflex microscopic Urine, Clean Catch   Cardiac monitoring   Care order/instruction: RN, on patient arrival, please call [phone] and let patient placement RN know of the patient's arrival and a hospitalist will be assigned to admit the patient.   Consult to hospitalist   Airborne and Contact precautions   Pulse oximetry, continuous   CBG monitoring, ED   EKG 12-Lead   ED EKG  Place in observation (patient's expected length of stay will be less than 2 midnights)    Following Medications were ordered in ER: Medications  ondansetron (ZOFRAN-ODT) disintegrating tablet 4 mg (4 mg Oral Given 03/23/21 1114)  meclizine (ANTIVERT) tablet 12.5 mg (12.5 mg Oral Given 03/23/21 1358)  cefTRIAXone (ROCEPHIN) 1 g in sodium chloride 0.9 % 100 mL IVPB (0 g Intravenous Stopped 03/23/21 1630)  azithromycin (ZITHROMAX) 500 mg in sodium chloride 0.9 % 250 mL IVPB (0 mg Intravenous Stopped 03/23/21 1553)  lactated ringers bolus 1,000 mL (0 mLs Intravenous Stopped 03/23/21 1634)        Consult Orders  (From admission, onward)           Start     Ordered   03/23/21 1411  Consult to hospitalist  Paged on call Hospitalist via [facility] at [place]- per PA at ext. [phone]  Once       Provider:  (Not yet assigned)  Question Answer Comment  Place call to: [facility]   Reason for Consult Admit      03/23/21 1410              OTHER Significant initial  Findings:  labs showing:    Recent Labs  Lab 03/23/21 1111  NA 139  K 3.8  CO2 28  GLUCOSE 93  BUN 9  CREATININE 0.58  CALCIUM 9.0    Cr   stable,   Lab Results  Component Value Date   CREATININE 0.58 03/23/2021   CREATININE 0.70 10/05/2020   CREATININE 0.62 01/26/2020    Recent Labs  Lab 03/23/21 1111  AST 17  ALT 10  ALKPHOS 84  BILITOT 0.6  PROT 7.1  ALBUMIN 3.3*   Lab Results  Component Value Date   CALCIUM 9.0 03/23/2021    Plt: Lab Results  Component Value Date    PLT 474 (H) 03/23/2021        Recent Labs  Lab 03/23/21 1111  WBC 9.9  NEUTROABS 6.9  HGB 12.0  HCT 37.5  MCV 87.0  PLT 474*    HG/HCT   stable,      Component Value Date/Time   HGB 12.0 03/23/2021 1111   HGB 12.4 10/05/2020 1350   HCT 37.5 03/23/2021 1111   MCV 87.0 03/23/2021 1111    CBG (last 3)  Recent Labs    03/23/21 1116  GLUCAP 90        Cultures: No results found for: SDES, [place], CULT, REPTSTATUS   Radiological Exams on Admission: DG Chest 2 View  Result Date: 03/23/2021 CLINICAL DATA:  Fall.  Difficulty with gait.  Chest tightness. EXAM: CHEST - 2 VIEW COMPARISON:  February 25, 2004 FINDINGS: Infiltrate identified in the left base. Reticular changes seen in the periphery of both lungs. The cardiomediastinal silhouette is normal. No other abnormalities. IMPRESSION: New left basilar infiltrate worrisome for pneumonia. Recommend short-term follow-up imaging after treatment to ensure resolution. Reticular changes in the periphery of both lungs suggests interstitial lung disease/fibrosis. Electronically Signed   By: Dorise Bullion III M.D.   On: 03/23/2021 11:22   CT HEAD WO CONTRAST (5MM)  Result Date: 03/23/2021 CLINICAL DATA:  Head trauma. Patient fell twice today. Difficulty with gait. EXAM: CT HEAD WITHOUT CONTRAST TECHNIQUE: Contiguous axial images were obtained from the base of the skull through the vertex without intravenous contrast. COMPARISON:  None. FINDINGS: Brain: No subdural, epidural, or subarachnoid hemorrhage. Ventricles and sulci are unremarkable. No mass effect or midline shift. Cerebellum, brainstem,  and basal cisterns are normal. Mild scattered white matter changes. No acute cortical ischemia or infarct. Vascular: No hyperdense vessel or unexpected calcification. Skull: Normal. Negative for fracture or focal lesion. Sinuses/Orbits: No acute finding. Other: No other abnormalities. IMPRESSION: No acute abnormalities.  Mild chronic white matter  changes. Electronically Signed   By: Dorise Bullion III M.D.   On: 03/23/2021 11:21   CT Chest Wo Contrast  Result Date: 03/23/2021 CLINICAL DATA:  75 year old female with chest discomfort. EXAM: CT CHEST WITHOUT CONTRAST TECHNIQUE: Multidetector CT imaging of the chest was performed following the standard protocol without IV contrast. COMPARISON:  03/23/2021 chest radiograph and prior studies FINDINGS: Cardiovascular: Heart size is normal. Coronary artery and aortic atherosclerotic calcifications are noted. There is no evidence of thoracic aortic aneurysm or pericardial effusion. Mediastinum/Nodes: No enlarged mediastinal or axillary lymph nodes. Thyroid gland, trachea, and esophagus demonstrate no significant findings. Lungs/Pleura: A 7.5 x 7.2 cm LEFT LOWER lobe mass versus consolidation is identified. Diffuse bilateral subpleural interlobular septal thickening is noted compatible with pulmonary fibrosis/UIP. Centrilobular emphysema is noted. No pleural effusion identified. Upper Abdomen: No acute abnormality. Musculoskeletal: No acute or suspicious bony abnormalities are noted. IMPRESSION: 1. 7.5 x 7.2 cm LEFT LOWER lobe mass/masslike consolidation which may represent malignancy versus infection/pneumonia. If patient has no symptoms of infection/pneumonia, then this is worrisome for malignancy. 2. Pulmonary fibrosis/UIP. 3. Coronary artery disease. 4. Aortic Atherosclerosis (ICD10-I70.0) and Emphysema (ICD10-J43.9). Electronically Signed   By: Margarette Canada M.D.   On: 03/23/2021 15:05   CT Angio Chest Pulmonary Embolism (PE) W or WO Contrast  Result Date: 03/23/2021 CLINICAL DATA:  PE suspected, high probability EXAM: CT ANGIOGRAPHY CHEST WITH CONTRAST TECHNIQUE: Multidetector CT imaging of the chest was performed using the standard protocol during bolus administration of intravenous contrast. Multiplanar CT image reconstructions and MIPs were obtained to evaluate the vascular anatomy. CONTRAST:  68m  OMNIPAQUE IOHEXOL 350 MG/ML SOLN COMPARISON:  Same day CT chest without contrast FINDINGS: Cardiovascular: Satisfactory opacification of the pulmonary arteries to the segmental level. No evidence of pulmonary embolism. Normal heart size. No pericardial effusion. Aortic atherosclerosis. Mediastinum/Nodes: No enlarged mediastinal, hilar, or axillary lymph nodes. Thyroid gland, trachea, and esophagus demonstrate no significant findings. Lungs/Pleura: Redemonstrated 7.5 cm left lower lobe mass versus consolidation. Diffuse bilateral subpleural interlobular septal thickening, bronchiectasis, and honeycombing, compatible with fibrosis/UIP. Centrilobular emphysema. No pleural effusion. Upper Abdomen: No acute abnormality. No evidence of reflux into the hepatic veins. Musculoskeletal: No acute osseous abnormality Review of the MIP images confirms the above findings. IMPRESSION: 1. Negative for pulmonary embolism to the level of the segmental arteries. 2. Redemonstrated 7.5 cm left lower lobe mass or consolidation, concerning for malignancy versus infection. 3. Pulmonary fibrosis/UIP. 4. Aortic Atherosclerosis (ICD10-I70.0) and Emphysema (ICD10-J43.9). Electronically Signed   By: AMerilyn BabaM.D.   On: 03/23/2021 22:02   _______________________________________________________________________________________________________ Latest   Blood pressure 124/85, pulse 79, temperature 98.9 F (37.2 C), temperature source Oral, resp. rate 17, SpO2 97 %.   Review of Systems:    Pertinent positives include:  chills, fatigue,   chest pain shortness of breath at rest.   dyspnea on exertion non-productive cough, Constitutional:  No weight loss, night sweats, Fevers, weight loss  HEENT:  No headaches, Difficulty swallowing,Tooth/dental problems,Sore throat,  No sneezing, itching, ear ache, nasal congestion, post nasal drip,  Cardio-vascular:  No, Orthopnea, PND, anasarca, dizziness, palpitations.no Bilateral lower  extremity swelling  GI:  No heartburn, indigestion, abdominal pain, nausea, vomiting, diarrhea, change in bowel  habits, loss of appetite, melena, blood in stool, hematemesis Resp:  no   No excess mucus, no productive cough, No  No coughing up of blood.No change in color of mucus.No wheezing. Skin:  no rash or lesions. No jaundice GU:  no dysuria, change in color of urine, no urgency or frequency. No straining to urinate.  No flank pain.  Musculoskeletal:  No joint pain or no joint swelling. No decreased range of motion. No back pain.  Psych:  No change in mood or affect. No depression or anxiety. No memory loss.  Neuro: no localizing neurological complaints, no tingling, no weakness, no double vision, no gait abnormality, no slurred speech, no confusion  All systems reviewed and apart from [place] all are negative _______________________________________________________________________________________________ Past Medical History:   Past Medical History:  Diagnosis Date   Abnormal chest x-ray 2005   COPD changes (2005) + interstitial lung dz changes (2002)   Anxiety and depression    Prozac, celexa, paxil, wellbutrin, and buspar all failures.  Pt has stated xanax is only med that helps.   Tobacco dependence       Past Surgical History:  Procedure Laterality Date   ABDOMINAL HYSTERECTOMY  1979   APPENDECTOMY  1966    Social History:  Ambulatory  independently      reports that she has been smoking cigarettes. She has a 27.50 pack-year smoking history. She has never used smokeless tobacco. She reports that she does not drink alcohol and does not use drugs.   Family History:   Family History  Problem Relation Age of Onset   Diabetes Other    Suicidality Father    Heart disease Brother    ______________________________________________________________________________________________ Allergies: Allergies  Allergen Reactions   Doxycycline Swelling   Prednisone Other (See  Comments)    Very emotional, crying, angry   Penicillins Rash    Has patient had a PCN reaction causing immediate rash, facial/tongue/throat swelling, SOB or lightheadedness with hypotension: yes Has patient had a PCN reaction causing severe rash involving mucus membranes or skin necrosis: no Has patient had a PCN reaction that required hospitalization: no Has patient had a PCN reaction occurring within the last 10 years: no If all of the above answers are "NO", then may proceed with Cephalosporin use.    Sulfonamide Derivatives Rash     Prior to Admission medications   Medication Sig Start Date End Date Taking? Authorizing Provider  ALPRAZolam Duanne Moron) 0.5 MG tablet Take 0.5 mg by mouth 3 (three) times daily as needed for anxiety.  03/20/16   [provider]  amitriptyline (ELAVIL) 50 MG tablet Take 1 tablet (50 mg total) by mouth at bedtime. 02/18/21   Ventura Sellers, MD  busPIRone (BUSPAR) 10 MG tablet 1 tablet 07/12/19   [provider]  CALCIUM PO Take 1 tablet by mouth every morning.    [provider]  Dextran 70-Hypromellose (ARTIFICIAL TEARS) 0.1-0.3 % SOLN 1 drop into both eyes 10/16/17   [provider]  diclofenac Sodium (VOLTAREN) 1 % GEL See admin instructions.    [provider]  DULoxetine (CYMBALTA) 30 MG capsule Take 1 capsule (30 mg total) by mouth daily. 12/24/20   Ventura Sellers, MD  lamoTRIgine (LAMICTAL) 25 MG tablet Take 25 mg by mouth every morning. 05/01/16   [provider]  Multiple Vitamins-Minerals (CENTRUM SILVER 50+WOMEN PO) 1 tablet    [provider]  Pancrelipase, Lip-Prot-Amyl, (CREON PO) 2 capsules with each meal and 1 capsule with  each snack 09/20/18   [provider]  primidone (MYSOLINE) 50 MG tablet Take 1 tablet (50 mg total) by mouth at bedtime. 10/18/20   Ventura Sellers, MD  valACYclovir (VALTREX) 500 MG tablet Take 500 mg by mouth daily. Patient not taking: Reported on  12/24/2020    [provider]  vitamin C (ASCORBIC ACID) 500 MG tablet Take 1,000 mg by mouth every morning.    [provider]    ___________________________________________________________________________________________________ Physical Exam: Vitals with BMI 03/23/2021 03/23/2021 03/23/2021  Height - - -  Weight - - -  BMI - - -  Systolic 244 010 272  Diastolic 85 76 77  Pulse 79 84 80     1. General:  in No  Acute distress   Chronically ill  -appearing 2. Psychological: Alert and   Oriented 3. Head/ENT:    Dry Mucous Membranes                          Head Non traumatic, neck supple                    Poor Dentition 4. SKIN:  decreased Skin turgor,  Skin clean Dry and intact no rash 5. Heart: Regular rate and rhythm no  Murmur, no Rub or gallop 6. Lungs: , no wheezes or crackles   7. Abdomen: Soft,  non-tender, Non distended   8. Lower extremities: no clubbing, cyanosis, no  edema 9. Neurologically  strength 5 out of 5 in all 4 extremities cranial nerves II through XII intact 10. MSK: Normal range of motion    Chart has been reviewed  ______________________________________________________________________________________________  Assessment/Plan  75 y.o. 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 vs [place]  will admit for treatment of CAP will start on appropriate antibiotic coverage. - Rocephin/azithromycin will need follow-up and diagnostic evaluation for possible malignancy.  Recommend pulmonology consult in a.m.   Obtain:  sputum cultures,                   COVID PCR negative                  blood cultures and sputum cultures  ordered                   strep pneumo UA antigen,                    check for Legionella antigen.                Provide oxygen as needed.    Anxiety -Xanax PRN   Tremor of unknown origin -chronic patient is to see neurology as an Patient   MGUS (monoclonal gammopathy of unknown significance)-followed by oncology chronic stable emailed primary oncologist the patient has been admitted   Tobacco abuse -  - Spoke about importance of quitting spent 5 minutes discussing options for treatment, prior attempts at quitting, and dangers of smoking  -At this point patient is    interested in quitting  - order nicotine patch   - nursing tobacco cessation protocol   Debility will need PT OT evaluation  prior to discharge    Intractable nausea and vomiting supportive management Anti nausea medications   Vertigo -obtain MRI of the brain be without contrast given history of malignancy Pending results may need further work-up and will consult  Other plan as per orders.  DVT prophylaxis:  SCD    Code Status:  DNR/DNI   as per patient   I had personally discussed CODE STATUS with patient and family     Family Communication:   Family at  Bedside  plan of care was discussed   with Daughter  Disposition Plan:     To home once workup is complete and patient is stable   Following barriers for discharge:                                                     Will need to be able to tolerate PO                                              Would benefit from PT/OT eval prior to DC  Ordered                                        Consults called: none would benefit from follow up with onology/pulmonology  Admission status:  ED Disposition     ED Disposition  [facility]: [facility] [[id number]]  Level of Care: Med-Surg [16]  Interfacility transfer: Yes  May place patient in observation at [facility] or [place] if equivalent level of care  is available:: Yes  Covid Evaluation: Symptomatic Person Under Investigation (PUI)  Diagnosis: CAP (community acquired pneumonia) [161096]  Admitting Physician: Caren Griffins [phone]  Attending Physician: Caren Griffins [[id number]]           Obs   Level of care     medical floor     Lab Results  Component Value Date   [place] 03/23/2021     Precautions: admitted as  Covid Negative   PPE: Used by the provider:   N95  eye Goggles,  Gloves    Javonn Gauger 03/23/2021, 10:22 PM    [facility]     after 2 AM please page floor coverage PA If 7AM-7PM, please contact the day team taking care of the patient using [email or web address]   Patient was evaluated in the context of the global COVID-19 pandemic, which necessitated consideration that the patient might be at risk for infection with the SARS-CoV-2 virus that causes COVID-19. Institutional protocols and algorithms that pertain to the evaluation of patients at risk for COVID-19 are in a state of rapid change based on information released by regulatory bodies including the CDC and federal and state organizations. These policies and algorithms were followed during the patient's care.

## 2021-03-23 NOTE — ED Notes (Signed)
Report called to [place] RN at [facility]

## 2021-03-24 ENCOUNTER — Observation Stay (HOSPITAL_COMMUNITY): Payer: Medicare Other

## 2021-03-24 DIAGNOSIS — R2681 Unsteadiness on feet: Secondary | ICD-10-CM | POA: Diagnosis present

## 2021-03-24 DIAGNOSIS — I2510 Atherosclerotic heart disease of native coronary artery without angina pectoris: Secondary | ICD-10-CM | POA: Diagnosis present

## 2021-03-24 DIAGNOSIS — J84112 Idiopathic pulmonary fibrosis: Secondary | ICD-10-CM | POA: Diagnosis not present

## 2021-03-24 DIAGNOSIS — F17210 Nicotine dependence, cigarettes, uncomplicated: Secondary | ICD-10-CM | POA: Diagnosis present

## 2021-03-24 DIAGNOSIS — R918 Other nonspecific abnormal finding of lung field: Secondary | ICD-10-CM | POA: Diagnosis not present

## 2021-03-24 DIAGNOSIS — G629 Polyneuropathy, unspecified: Secondary | ICD-10-CM | POA: Diagnosis present

## 2021-03-24 DIAGNOSIS — Z888 Allergy status to other drugs, medicaments and biological substances status: Secondary | ICD-10-CM | POA: Diagnosis not present

## 2021-03-24 DIAGNOSIS — R296 Repeated falls: Secondary | ICD-10-CM

## 2021-03-24 DIAGNOSIS — Z880 Allergy status to penicillin: Secondary | ICD-10-CM | POA: Diagnosis not present

## 2021-03-24 DIAGNOSIS — W1830XA Fall on same level, unspecified, initial encounter: Secondary | ICD-10-CM | POA: Diagnosis present

## 2021-03-24 DIAGNOSIS — Z961 Presence of intraocular lens: Secondary | ICD-10-CM | POA: Diagnosis present

## 2021-03-24 DIAGNOSIS — R54 Age-related physical debility: Secondary | ICD-10-CM | POA: Diagnosis present

## 2021-03-24 DIAGNOSIS — D472 Monoclonal gammopathy: Secondary | ICD-10-CM

## 2021-03-24 DIAGNOSIS — R112 Nausea with vomiting, unspecified: Secondary | ICD-10-CM | POA: Diagnosis present

## 2021-03-24 DIAGNOSIS — R42 Dizziness and giddiness: Secondary | ICD-10-CM | POA: Diagnosis not present

## 2021-03-24 DIAGNOSIS — R251 Tremor, unspecified: Secondary | ICD-10-CM | POA: Diagnosis present

## 2021-03-24 DIAGNOSIS — Z8249 Family history of ischemic heart disease and other diseases of the circulatory system: Secondary | ICD-10-CM | POA: Diagnosis not present

## 2021-03-24 DIAGNOSIS — Z6821 Body mass index (BMI) 21.0-21.9, adult: Secondary | ICD-10-CM | POA: Diagnosis not present

## 2021-03-24 DIAGNOSIS — C3432 Malignant neoplasm of lower lobe, left bronchus or lung: Secondary | ICD-10-CM | POA: Diagnosis present

## 2021-03-24 DIAGNOSIS — W2203XA Walked into furniture, initial encounter: Secondary | ICD-10-CM | POA: Diagnosis present

## 2021-03-24 DIAGNOSIS — Z881 Allergy status to other antibiotic agents status: Secondary | ICD-10-CM | POA: Diagnosis not present

## 2021-03-24 DIAGNOSIS — F419 Anxiety disorder, unspecified: Secondary | ICD-10-CM

## 2021-03-24 DIAGNOSIS — Z66 Do not resuscitate: Secondary | ICD-10-CM | POA: Diagnosis present

## 2021-03-24 DIAGNOSIS — Z79899 Other long term (current) drug therapy: Secondary | ICD-10-CM | POA: Diagnosis not present

## 2021-03-24 DIAGNOSIS — Z20822 Contact with and (suspected) exposure to covid-19: Secondary | ICD-10-CM | POA: Diagnosis present

## 2021-03-24 DIAGNOSIS — R9389 Abnormal findings on diagnostic imaging of other specified body structures: Secondary | ICD-10-CM

## 2021-03-24 DIAGNOSIS — Z833 Family history of diabetes mellitus: Secondary | ICD-10-CM | POA: Diagnosis not present

## 2021-03-24 DIAGNOSIS — F32A Depression, unspecified: Secondary | ICD-10-CM | POA: Diagnosis present

## 2021-03-24 LAB — COMPREHENSIVE METABOLIC PANEL
ALT: 10 U/L (ref 0–44)
AST: 15 U/L (ref 15–41)
Albumin: 3.1 g/dL — ABNORMAL LOW (ref 3.5–5.0)
Alkaline Phosphatase: 71 U/L (ref 38–126)
Anion gap: 8 (ref 5–15)
BUN: 8 mg/dL (ref 8–23)
CO2: 28 mmol/L (ref 22–32)
Calcium: 9.1 mg/dL (ref 8.9–10.3)
Chloride: 110 mmol/L (ref 98–111)
Creatinine, Ser: 0.38 mg/dL — ABNORMAL LOW (ref 0.44–1.00)
GFR, Estimated: >60 mL/min (ref >60)
Glucose, Bld: 82 mg/dL (ref 70–99)
Potassium: 3.5 mmol/L (ref 3.5–5.1)
Sodium: 146 mmol/L — ABNORMAL HIGH (ref 135–145)
Total Bilirubin: 0.4 mg/dL (ref 0.3–1.2)
Total Protein: 6.5 g/dL (ref 6.5–8.1)

## 2021-03-24 LAB — CBC WITH DIFFERENTIAL/PLATELET
Abs Immature Granulocytes: 0.03 10*3/uL (ref 0.00–0.07)
Basophils Absolute: 0.1 10*3/uL (ref 0.0–0.1)
Basophils Relative: 1 %
Eosinophils Absolute: 0.1 10*3/uL (ref 0.0–0.5)
Eosinophils Relative: 1 %
HCT: 35.4 % — ABNORMAL LOW (ref 36.0–46.0)
Hemoglobin: 11.1 g/dL — ABNORMAL LOW (ref 12.0–15.0)
Immature Granulocytes: 0 %
Lymphocytes Relative: 29 %
Lymphs Abs: 2.4 10*3/uL (ref 0.7–4.0)
MCH: 27.5 pg (ref 26.0–34.0)
MCHC: 31.4 g/dL (ref 30.0–36.0)
MCV: 87.6 fL (ref 80.0–100.0)
Monocytes Absolute: 0.9 10*3/uL (ref 0.1–1.0)
Monocytes Relative: 11 %
Neutro Abs: 4.9 10*3/uL (ref 1.7–7.7)
Neutrophils Relative %: 58 %
Platelets: 431 10*3/uL — ABNORMAL HIGH (ref 150–400)
RBC: 4.04 MIL/uL (ref 3.87–5.11)
RDW: 13.2 % (ref 11.5–15.5)
WBC: 8.4 10*3/uL (ref 4.0–10.5)
nRBC: 0 % (ref 0.0–0.2)

## 2021-03-24 LAB — TSH: TSH: 1.376 u[IU]/mL (ref 0.350–4.500)

## 2021-03-24 LAB — GLUCOSE, CAPILLARY: Glucose-Capillary: 92 mg/dL (ref 70–99)

## 2021-03-24 LAB — PHOSPHORUS: Phosphorus: 3.3 mg/dL (ref 2.5–4.6)

## 2021-03-24 LAB — PROCALCITONIN
Procalcitonin: <0.1 ng/mL
Procalcitonin: <0.1 ng/mL

## 2021-03-24 LAB — MAGNESIUM: Magnesium: 2.1 mg/dL (ref 1.7–2.4)

## 2021-03-24 MED ORDER — NICOTINE 14 MG/24HR TD PT24: 14.0000 mg | MEDICATED_PATCH | Freq: Every day | TRANSDERMAL | Status: DC

## 2021-03-24 MED ORDER — HYPROMELLOSE 0.3 % OP GEL: 1.0000 "application " | Freq: Every evening | OPHTHALMIC | Status: DC | PRN

## 2021-03-24 MED ORDER — GADOBUTROL 1 MMOL/ML IV SOLN
5.0000 mL | Freq: Once | INTRAVENOUS | Status: AC | PRN
  Administered 2021-03-24: 5 mL via INTRAVENOUS

## 2021-03-24 MED ORDER — POLYVINYL ALCOHOL 1.4 % OP SOLN
1.0000 [drp] | OPHTHALMIC | Status: DC | PRN
  Administered 2021-03-24: 1 [drp] via OPHTHALMIC
  Filled 2021-03-24 (×2): qty 15

## 2021-03-24 MED ORDER — GABAPENTIN 300 MG PO CAPS: 300.0000 mg | ORAL_CAPSULE | Freq: Every evening | ORAL | Status: DC | PRN

## 2021-03-24 MED ORDER — ALPRAZOLAM 0.5 MG PO TABS
0.5000 mg | ORAL_TABLET | Freq: Three times a day (TID) | ORAL | Status: DC | PRN
  Administered 2021-03-24 – 2021-03-25 (×5): 1 mg via ORAL
  Administered 2021-03-26: 0.5 mg via ORAL
  Administered 2021-03-26 (×2): 1 mg via ORAL
  Administered 2021-03-27: 0.5 mg via ORAL
  Filled 2021-03-24 (×9): qty 2

## 2021-03-24 NOTE — Evaluation (Signed)
Physical Therapy Evaluation Patient Details Name: Molly Welch MRN: [id number] DOB: Mar 10, 1946 Today's Date: 03/24/2021  History of Present Illness  patient is a 75 year old female who presented to the hosptial after falls at home. patient was admitted for CAP v.s.malignancy and vertigo. PMH: neuropathy, benign monoclonal gammopathy with tremor, anxiety,  Clinical Impression  Pt admitted as above and presenting with functional mobility limitations 2* decreased endurance, balance deficits and generalized weakness.  Pt hopes to progress to dc home with intermittent assist of family and would benefit from follow up HHPT to further address deficits.  Pt states she would prefer to go to OP PT at facility where she works for therapy but only once physician clears her to drive.     Recommendations for follow up therapy are one component of a multi-disciplinary discharge planning process, led by the attending physician.  Recommendations may be updated based on patient status, additional functional criteria and insurance authorization.  Follow Up Recommendations Home health PT    Equipment Recommendations  Rolling walker with 5" wheels (Youth level RW please)    Recommendations for Other Services       Precautions / Restrictions Precautions Precautions: Fall Restrictions Weight Bearing Restrictions: No      Mobility  Bed Mobility Overal bed mobility: Needs Assistance Bed Mobility: Supine to Sit     Supine to sit: Min guard;HOB elevated          Transfers Overall transfer level: Needs assistance Equipment used: Rolling walker (2 wheeled) Transfers: Sit to/from [facility] Sit to Stand: Min assist;From elevated surface Stand pivot transfers: Min guard;From elevated surface       General transfer comment: cues for safe transition positioin and use of UEs to self assist  Ambulation/Gait Ambulation/Gait assistance: Min assist Gait Distance (Feet): 120  Feet Assistive device: Rolling walker (2 wheeled);1 person hand held assist Gait Pattern/deviations: Step-through pattern;Decreased step length - right;Decreased step length - left;Shuffle Gait velocity: decr   General Gait Details: Increased time with short shuffling step and precarious stability improved with move to RW.  Pt reports feeling much safer and steadier  Stairs            Wheelchair Mobility    Modified Rankin (Stroke Patients Only)       Balance Overall balance assessment: Needs assistance Sitting-balance support: No upper extremity supported Sitting balance-Leahy Scale: Good     Standing balance support: During functional activity Standing balance-Leahy Scale: Fair Standing balance comment: during brushing teeth at sink                             Pertinent Vitals/Pain Pain Assessment: No/denies pain    Home Living Family/patient expects to be discharged to:: Private residence Living Arrangements: Alone Available Help at Discharge: Family;Available PRN/intermittently Type of Home: House Home Access: Stairs to enter Entrance Stairs-Rails: Right Entrance Stairs-Number of Steps: 5 Home Layout: One level Home Equipment: Cane - single point      Prior Function Level of Independence: Independent         Comments: patient reported having had falls in the past outside gardening. patient was independent in ADLs, IADLs and driving. 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     Hand Dominance   Dominant Hand: Right    Extremity/Trunk Assessment   Upper Extremity Assessment Upper Extremity Assessment: Overall WFL for tasks assessed    Lower Extremity Assessment Lower Extremity  Assessment: Overall WFL for tasks assessed (Pt reports intermittent burning 2* neuropathy)    Cervical / Trunk Assessment Cervical / Trunk Assessment: Normal  Communication   Communication: No difficulties  Cognition  Arousal/Alertness: Awake/alert Behavior During Therapy: WFL for tasks assessed/performed Overall Cognitive Status: Within Functional Limits for tasks assessed                                 General Comments: patient was plesant and motivated to participate      General Comments      Exercises     Assessment/Plan    PT Assessment Patient needs continued PT services  PT Problem List Decreased strength;Decreased range of motion;Decreased activity tolerance;Decreased balance;Decreased mobility;Decreased knowledge of use of DME       PT Treatment Interventions DME instruction;Gait training;Stair training;Functional mobility training;Therapeutic activities;Therapeutic exercise;Patient/family education;Balance training    PT Goals (Current goals can be found in the Care Plan section)  Acute Rehab PT Goals Patient Stated Goal: to get back to work PT Goal Formulation: With patient Time For Goal Achievement: 04/07/21 Potential to Achieve Goals: Good    Frequency Min 3X/week   Barriers to discharge Decreased caregiver support Pt home alone    Co-evaluation PT/OT/SLP Co-Evaluation/Treatment: Yes Reason for Co-Treatment: To address functional/ADL transfers PT goals addressed during session: Mobility/safety with mobility OT goals addressed during session: ADL's and self-care       AM-PAC PT "6 Clicks" Mobility  Outcome Measure Help needed turning from your back to your side while in a flat bed without using bedrails?: None Help needed moving from lying on your back to sitting on the side of a flat bed without using bedrails?: A Little Help needed moving to and from a bed to a chair (including a wheelchair)?: A Little Help needed standing up from a chair using your arms (e.g., wheelchair or bedside chair)?: A Little Help needed to walk in hospital room?: A Little Help needed climbing 3-5 steps with a railing? : A Lot 6 Click Score: 18    End of Session Equipment  Utilized During Treatment: Gait belt Activity Tolerance: Patient tolerated treatment well Patient left: in chair;with call bell/phone within reach;with nursing/sitter in room Nurse Communication: Mobility status PT Visit Diagnosis: Unsteadiness on feet (R26.81);History of falling (Z91.81)    Time: 7282-0601 PT Time Calculation (min) (ACUTE ONLY): 33 min   Charges:   PT Evaluation $PT Eval Low Complexity: [facility] Pager [phone] Office [phone]   Sakiya Stepka 03/24/2021, 1:27 PM

## 2021-03-24 NOTE — Evaluation (Signed)
Occupational Therapy Evaluation Patient Details Name: Molly Welch MRN: [id number] DOB: 04-Aug-1945 Today's Date: 03/24/2021   History of Present Illness patient is a 75 year old female who presented to the hosptial after falls at home. patient was admitted for CAP v.s.malignancy and vertigo. PMH: neuropathy, benign monoclonal gammopathy with tremor, anxiety,   Clinical Impression   Patient is a 75 year old female who was admitted for above. Patient was living at home alone with no AE previously. Currently, patient is min guard for mobility with RW with cues for safety awareness. Patient is min A for ADL tasks with increased time to complete them. Patient was noted to have decreased activity tolerance, decreased endurance, decreased standing balance and decreased [place] impacting patients ability to engage in ADLs. Patient would continue to benefit from skilled OT services at this time while admitted and after d/c to address noted deficits in order to improve overall safety and independence in ADLs.        Recommendations for follow up therapy are one component of a multi-disciplinary discharge planning process, led by the attending physician.  Recommendations may be updated based on patient status, additional functional criteria and insurance authorization.   Follow Up Recommendations  Home health OT;Supervision/Assistance - 24 hour    Equipment Recommendations  Other (comment) (RW)    Recommendations for Other Services       Precautions / Restrictions Precautions Precautions: Fall Restrictions Weight Bearing Restrictions: No      Mobility Bed Mobility Overal bed mobility: Needs Assistance Bed Mobility: Supine to Sit     Supine to sit: Min guard;HOB elevated          Transfers Overall transfer level: Needs assistance Equipment used: Rolling walker (2 wheeled) Transfers: Sit to/from [facility] Sit to Stand: Min assist;From elevated surface Stand pivot  transfers: Min guard;From elevated surface       General transfer comment: attempted mobility without AE with increased unsteadiness with 2 person hand held assist. patient when using RW requires safety cues for awareness of environment and keeping walker close during mobility.    Balance Overall balance assessment: Needs assistance Sitting-balance support: No upper extremity supported Sitting balance-Leahy Scale: Good     Standing balance support: During functional activity Standing balance-Leahy Scale: Fair Standing balance comment: during brushing teeth at sink                           ADL either performed or assessed with clinical judgement   ADL Overall ADL's : Needs assistance/impaired Eating/Feeding: Set up;Sitting Eating/Feeding Details (indicate cue type and reason): small containers need to be opened with patients [place] deficits. Grooming: Oral care;Wash/dry face;Standing;Min guard Grooming Details (indicate cue type and reason): min guard standig at sink to complete task Upper Body Bathing: Min guard;Sitting   Lower Body Bathing: Sitting/lateral leans;Min guard   Upper Body Dressing : Sitting;Set up   Lower Body Dressing: Min guard;Sit to/from stand       Toileting- Water quality scientist and Hygiene: Minimal assistance;Sit to/from stand       Functional mobility during ADLs: Min guard;Rolling walker;Cueing for safety General ADL Comments: patient needed cues to keep RW close to her during functional mobility.     Vision Patient Visual Report: No change from baseline       Perception     Praxis      Pertinent Vitals/Pain Pain Assessment: No/denies pain     Hand Dominance Right  Extremity/Trunk Assessment Upper Extremity Assessment Upper Extremity Assessment: Overall WFL for tasks assessed (patient was noted to have some reports of gross grasp strength deficits but was able to manipulate toothbrush in hands with no deficits. patient was  also noted to have tremors in BUE with movement. patient reported having trying weights in past)   Lower Extremity Assessment Lower Extremity Assessment: Defer to PT evaluation   Cervical / Trunk Assessment Cervical / Trunk Assessment: Normal   Communication Communication Communication: No difficulties   Cognition Arousal/Alertness: Awake/alert Behavior During Therapy: WFL for tasks assessed/performed Overall Cognitive Status: Within Functional Limits for tasks assessed                                 General Comments: patient was plesant and motivated to participate   General Comments       Exercises     Shoulder Instructions      Home Living Family/patient expects to be discharged to:: Private residence Living Arrangements: Alone Available Help at Discharge: Family;Available PRN/intermittently (daughter) Type of Home: House       Home Layout: One level     Bathroom Shower/Tub: Occupational psychologist: Standard     Home Equipment: None          Prior Functioning/Environment Level of Independence: Independent        Comments: patient reported having had falls in the past outside gardening. patient was independent in ADLs, IADLs and driving. 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        OT Problem List: Decreased strength;Decreased activity tolerance;Impaired balance (sitting and/or standing);Decreased safety awareness;Decreased knowledge of precautions;Decreased knowledge of use of DME or AE      OT Treatment/Interventions: Self-care/ADL training;Therapeutic exercise;Energy conservation;DME and/or AE instruction;Therapeutic activities;Balance training;Patient/family education    OT Goals(Current goals can be found in the care plan section) Acute Rehab OT Goals Patient Stated Goal: to get back to work OT Goal Formulation: With patient Time For Goal Achievement: 04/07/21 Potential to Achieve Goals:  Good  OT Frequency: Min 2X/week   Barriers to D/C:    patient lives at home alone       Co-evaluation PT/OT/SLP Co-Evaluation/Treatment: Yes Reason for Co-Treatment: To address functional/ADL transfers PT goals addressed during session: Mobility/safety with mobility OT goals addressed during session: ADL's and self-care      AM-PAC OT "6 Clicks" Daily Activity     Outcome Measure Help from another person eating meals?: A Little Help from another person taking care of personal grooming?: A Little Help from another person toileting, which includes using toliet, bedpan, or urinal?: A Little Help from another person bathing (including washing, rinsing, drying)?: A Little Help from another person to put on and taking off regular upper body clothing?: A Little Help from another person to put on and taking off regular lower body clothing?: A Little 6 Click Score: 18   End of Session Equipment Utilized During Treatment: Gait belt;Rolling walker Nurse Communication: Mobility status;Other (comment) (nurse cleared patient to participate)  Activity Tolerance: Patient tolerated treatment well Patient left: in chair;with call bell/phone within reach;with chair alarm set  OT Visit Diagnosis: Unsteadiness on feet (R26.81);Muscle weakness (generalized) (M62.81);Other abnormalities of gait and mobility (R26.89)                Time: 3846-6599 OT Time Calculation (min): 34 min Charges:  OT General Charges $OT Visit: 1 Visit  OT Evaluation $OT Eval Moderate Complexity: 1 Mod  Jackelyn Poling OTR/L, MS Acute Rehabilitation Department Office# [phone] Pager# [phone]   Long 03/24/2021, 1:01 PM

## 2021-03-24 NOTE — Progress Notes (Signed)
PROGRESS NOTE  Molly Welch IRC:[id number] DOB: 1945/12/16 DOA: 03/23/2021 PCP: Carolee Rota, NP   LOS: 0 days   Brief Narrative / Interim history: 75 year old female with history of MGUS, neuropathy, COPD, tobacco use, anxiety who comes into the hospital with unsteady gait progressive over the last few months and then couple of falls the day prior to admission.  She also has been having worsening tremors over the last few months for which she is set to see Dr. Carles Collet with neurology in a few days  Subjective / 24h Interval events: She is doing well this morning, complains of cough that has been chronic for her.  Worked with PT a little bit earlier and felt quite unsteady on her feet  Assessment & Plan: Principal Problem Lung mass, underlying pulmonary fibrosis-new.  She came into the hospital with weakness, initial chest x-ray was concerning for pneumonia and was placed on antibiotics.  A CT scan of the chest showed a large mass versus infiltrate.  She does not have a fever, no WBC, cough is relatively chronic, and clinically she does not appear to have an infectious process going on so pneumonia is ruled out.  Procalcitonin is negative x2.  Discontinue antibiotics. -Has been smoking for 46 years, up until 2011 she would smoke a pack a day then cut that down to half a pack a day.  Pulmonology consulted, discussed with Dr. Ander Slade, this does appear to be malignancy and less likely infectious. -She will need to be seen by oncology as well as pulmonology as an outpatient, needs biopsy  Active Problems Tremor, weakness, recurrent falls, vertigo-PT evaluated patient today, she is still quite weak and will benefit from repeat PT session on Monday.  Does not seem to be safe to go home this evening.  Hopefully tomorrow -MRI without acute findings  Anxiety-continue Xanax  MGUS-followed by oncology as an outpatient  Tobacco use-nicotine patch  Scheduled Meds:  guaiFENesin  600 mg Oral BID    lamoTRIgine  25 mg Oral BH-q7a   lipase/protease/amylase  72,000 Units Oral TID AC   nicotine  14 mg Transdermal Daily   valACYclovir  500 mg Oral Daily   Continuous Infusions: PRN Meds:.acetaminophen **OR** acetaminophen, albuterol, ALPRAZolam, gabapentin, HYDROcodone-acetaminophen, meclizine, ondansetron **OR** ondansetron (ZOFRAN) IV, polyvinyl alcohol  Diet Orders (From admission, onward)     Start     Ordered   03/23/21 2044  Diet Heart Room service appropriate? Yes; Fluid consistency: Thin  Diet effective now       Question Answer Comment  Room service appropriate? Yes   Fluid consistency: Thin      03/23/21 2043            DVT prophylaxis: SCDs Start: 03/23/21 2044     Code Status: DNR  Family Communication: No family at bedside  Status is: Inpatient  Remains inpatient appropriate because:Unsafe d/c plan and Inpatient level of care appropriate due to severity of illness  Dispo: The patient is from: Home              Anticipated d/c is to: Home              Patient currently is not medically stable to d/c.   Difficult to place patient No  Level of care: Med-Surg  Consultants:  Pulmonology  Procedures:  none  Microbiology  none  Antimicrobials: Ceftriaxone/azithromycin 9/17-9/18.   Objective: Vitals:   03/23/21 1930 03/23/21 2024 03/24/21 0606 03/24/21 1230  BP:  123/79 (!) 154/87  Pulse:  79 74   Resp:  16 19 (!) 23  Temp:  98.8 F (37.1 C)    TempSrc:  Oral    SpO2:  96% 98%   Weight: 53 kg     Height: 5\' 2"  (1.575 m)       Intake/Output Summary (Last 24 hours) at 03/24/2021 1519 Last data filed at 03/24/2021 1050 Gross per 24 hour  Intake 1590 ml  Output --  Net 1590 ml   Filed Weights   03/23/21 1930  Weight: 53 kg    Examination:  Constitutional: NAD Eyes: no scleral icterus ENMT: Mucous membranes are moist.  Neck: normal, supple Respiratory: Distant breath sounds, coarse at the bases.  No wheezing Cardiovascular:  Regular rate and rhythm, no murmurs / rubs / gallops. No LE edema. Abdomen: non distended, no tenderness. Bowel sounds positive.  Musculoskeletal: no clubbing / cyanosis.  Skin: no rashes Neurologic: Nonfocal  Data Reviewed: I have independently reviewed following labs and imaging studies   CBC: Recent Labs  Lab 03/23/21 1111 03/24/21 0550  WBC 9.9 8.4  NEUTROABS 6.9 4.9  HGB 12.0 11.1*  HCT 37.5 35.4*  MCV 87.0 87.6  PLT 474* 637*   Basic Metabolic Panel: Recent Labs  Lab 03/23/21 1111 03/23/21 2104 03/24/21 0550  NA 139  --  146*  K 3.8  --  3.5  CL 103  --  110  CO2 28  --  28  GLUCOSE 93  --  82  BUN 9  --  8  CREATININE 0.58  --  0.38*  CALCIUM 9.0  --  9.1  MG  --  1.9 2.1  PHOS  --  3.4 3.3   Liver Function Tests: Recent Labs  Lab 03/23/21 1111 03/24/21 0550  AST 17 15  ALT 10 10  ALKPHOS 84 71  BILITOT 0.6 0.4  PROT 7.1 6.5  ALBUMIN 3.3* 3.1*   Coagulation Profile: No results for input(s): INR, PROTIME in the last 168 hours. HbA1C: No results for input(s): HGBA1C in the last 72 hours. CBG: Recent Labs  Lab 03/23/21 1116  GLUCAP 90    Recent Results (from the past 240 hour(s))  Resp Panel by RT-PCR (Flu A&B, Covid) Nasopharyngeal Swab     Status: None   Collection Time: 03/23/21  2:40 PM   Specimen: Nasopharyngeal Swab; Nasopharyngeal(NP) swabs in vial transport medium  Result Value Ref Range Status   SARS Coronavirus 2 by RT PCR NEGATIVE NEGATIVE Final    Comment: (NOTE) SARS-CoV-2 target nucleic acids are NOT DETECTED.  The SARS-CoV-2 RNA is generally detectable in upper respiratory specimens during the acute phase of infection. The lowest concentration of SARS-CoV-2 viral copies this assay can detect is 138 copies/mL. A negative result does not preclude SARS-Cov-2 infection and should not be used as the sole basis for treatment or other patient management decisions. A negative result may occur with  improper specimen  collection/handling, submission of specimen other than nasopharyngeal swab, presence of viral mutation(s) within the areas targeted by this assay, and inadequate number of viral copies(<138 copies/mL). A negative result must be combined with clinical observations, patient history, and epidemiological information. The expected result is Negative.  Fact Sheet for Patients:  EntrepreneurPulse.com.au  Fact Sheet for Healthcare Providers:  IncredibleEmployment.be  This test is no t yet approved or cleared by the [place] FDA and  has been authorized for detection and/or diagnosis of SARS-CoV-2 by FDA under an Emergency Use Authorization (EUA). This EUA will  remain  in effect (meaning this test can be used) for the duration of the COVID-19 declaration under Section 564(b)(1) of the Act, 21 U.S.C.section 360bbb-3(b)(1), unless the authorization is terminated  or revoked sooner.       Influenza A by PCR NEGATIVE NEGATIVE Final   Influenza B by PCR NEGATIVE NEGATIVE Final    Comment: (NOTE) The Xpert Xpress SARS-CoV-2/FLU/RSV plus assay is intended as an aid in the diagnosis of influenza from Nasopharyngeal swab specimens and should not be used as a sole basis for treatment. Nasal washings and aspirates are unacceptable for Xpert Xpress SARS-CoV-2/FLU/RSV testing.  Fact Sheet for Patients: EntrepreneurPulse.com.au  Fact Sheet for Healthcare Providers: IncredibleEmployment.be  This test is not yet approved or cleared by the [place] FDA and has been authorized for detection and/or diagnosis of SARS-CoV-2 by FDA under an Emergency Use Authorization (EUA). This EUA will remain in effect (meaning this test can be used) for the duration of the COVID-19 declaration under Section 564(b)(1) of the Act, 21 U.S.C. section 360bbb-3(b)(1), unless the authorization is terminated or revoked.  Performed at [facility], [address] [place], [place] [address]      Radiology Studies: CT Angio Chest Pulmonary Embolism (PE) W or WO Contrast  Result Date: 03/23/2021 CLINICAL DATA:  PE suspected, high probability EXAM: CT ANGIOGRAPHY CHEST WITH CONTRAST TECHNIQUE: Multidetector CT imaging of the chest was performed using the standard protocol during bolus administration of intravenous contrast. Multiplanar CT image reconstructions and MIPs were obtained to evaluate the vascular anatomy. CONTRAST:  35mL OMNIPAQUE IOHEXOL 350 MG/ML SOLN COMPARISON:  Same day CT chest without contrast FINDINGS: Cardiovascular: Satisfactory opacification of the pulmonary arteries to the segmental level. No evidence of pulmonary embolism. Normal heart size. No pericardial effusion. Aortic atherosclerosis. Mediastinum/Nodes: No enlarged mediastinal, hilar, or axillary lymph nodes. Thyroid gland, trachea, and esophagus demonstrate no significant findings. Lungs/Pleura: Redemonstrated 7.5 cm left lower lobe mass versus consolidation. Diffuse bilateral subpleural interlobular septal thickening, bronchiectasis, and honeycombing, compatible with fibrosis/UIP. Centrilobular emphysema. No pleural effusion. Upper Abdomen: No acute abnormality. No evidence of reflux into the hepatic veins. Musculoskeletal: No acute osseous abnormality Review of the MIP images confirms the above findings. IMPRESSION: 1. Negative for pulmonary embolism to the level of the segmental arteries. 2. Redemonstrated 7.5 cm left lower lobe mass or consolidation, concerning for malignancy versus infection. 3. Pulmonary fibrosis/UIP. 4. Aortic Atherosclerosis (ICD10-I70.0) and Emphysema (ICD10-J43.9). Electronically Signed   By: Merilyn Baba M.D.   On: 03/23/2021 22:02   MR BRAIN W WO CONTRAST  Result Date: 03/24/2021 CLINICAL DATA:  Unsteady gait, falls EXAM: MRI HEAD WITHOUT AND WITH CONTRAST TECHNIQUE: Multiplanar, multiecho pulse sequences of the brain and  surrounding structures were obtained without and with intravenous contrast. CONTRAST:  30mL GADAVIST GADOBUTROL 1 MMOL/ML IV SOLN COMPARISON:  Noncontrast CT head 1 day prior FINDINGS: Brain: There is no evidence of acute intracranial hemorrhage, extra-axial fluid collection, or acute infarct. There is moderate global parenchymal volume loss with commensurate enlargement of the ventricular system. Extensive foci of FLAIR signal abnormality in the subcortical and periventricular white matter likely reflects sequela of chronic white matter microangiopathy. There is no suspicious parenchymal signal abnormality. There is no mass lesion. There is no midline shift. Incidental note is made of a left cerebellar hemisphere developmental venous anomaly. There is no abnormal enhancement. Vascular: Normal flow voids. Skull and upper cervical spine: Normal marrow signal. Sinuses/Orbits: The paranasal sinuses are clear. Bilateral lens implants are in  place. The globes and orbits are otherwise unremarkable. Other: None. IMPRESSION: 1. No acute intracranial hemorrhage or infarct. 2. Moderate parenchymal volume loss and chronic white matter microangiopathy. Electronically Signed   By: Valetta Mole M.D.   On: 03/24/2021 10:12     Marzetta Board, MD, PhD [facility]  Between 7 am - 7 pm I am available, please contact me via Amion (for emergencies) or Securechat (non urgent messages)  Between 7 pm - 7 am I am not available, please contact night coverage MD/APP via Amion

## 2021-03-24 NOTE — Plan of Care (Signed)
Pt in good spirits after MRI and findings today. Good support system. Pain managed well. Nicotine patch in place. Appetite is adequate. PO fluids encouraged. Worked with PT today with walker in hall. Call bell in reach fall precautions in place.   Problem: Clinical Measurements: Goal: Ability to maintain clinical measurements within normal limits will improve 03/24/2021 1528 by Wynell Balloon, RN Outcome: Progressing 03/24/2021 1245 by Ok Anis A, RN Outcome: Progressing Goal: Will remain free from infection 03/24/2021 1528 by Wynell Balloon, RN Outcome: Progressing 03/24/2021 1245 by Wynell Balloon, RN Outcome: Progressing Goal: Diagnostic test results will improve 03/24/2021 1528 by Wynell Balloon, RN Outcome: Progressing 03/24/2021 1245 by Wynell Balloon, RN Outcome: Progressing Goal: Respiratory complications will improve 03/24/2021 1528 by Wynell Balloon, RN Outcome: Progressing 03/24/2021 1245 by Ok Anis A, RN Outcome: Progressing Goal: Cardiovascular complication will be avoided 03/24/2021 1528 by Wynell Balloon, RN Outcome: Progressing 03/24/2021 1245 by Wynell Balloon, RN Outcome: Progressing   Problem: Activity: Goal: Risk for activity intolerance will decrease 03/24/2021 1528 by Wynell Balloon, RN Outcome: Progressing 03/24/2021 1245 by Ok Anis A, RN Outcome: Progressing   Problem: Safety: Goal: Ability to remain free from injury will improve 03/24/2021 1528 by Wynell Balloon, RN Outcome: Progressing 03/24/2021 1245 by Wynell Balloon, RN Outcome: Progressing

## 2021-03-24 NOTE — Consult Note (Signed)
   NAME:  Molly Welch, MRN:  [id number], DOB:  [birth year], LOS: 0 ADMISSION DATE:  03/23/2021, CONSULTATION DATE:  03/24/2021 REFERRING MD:  Dr Cruzita Lederer, CHIEF COMPLAINT:  abnormal Chest CT   History of Present Illness:  Patient admitted with recent falls [address] twice, related to weakness, dizziness, Admits to chest discomfort, chronic intermittent dry cough Has been having issues with tremors.  History of neuropathy, benign monoclonal gammopathy, emphysema Active smoker, history of anxiety  Chronic dry cough, intermittent No weight loss, no hemoptysis Appetite is fair No personal history of cancer Office work  She smokes about half a pack a day down from over 1 pack a day for many years  Pertinent  Medical History   Past Medical History:  Diagnosis Date   Abnormal chest x-ray 2005   COPD changes (2005) + interstitial lung dz changes (2002)   Anxiety and depression    Prozac, celexa, paxil, wellbutrin, and buspar all failures.  Pt has stated xanax is only med that helps.   Tobacco dependence    Significant Hospital Events: Including procedures, antibiotic start and stop dates in addition to other pertinent events   Abnormal CT chest, MRI head, CT head within normal limits-mild chronic white matter changes  Interim History / Subjective:  History of emphysema Was functioning well prior to recent falls, only being seen for tremors  Objective   Blood pressure (!) 154/87, pulse 74, temperature 98.8 F (37.1 C), temperature source Oral, resp. rate 19, height 5\' 2"  (1.575 m), weight 53 kg, SpO2 98 %.        Intake/Output Summary (Last 24 hours) at 03/24/2021 1219 Last data filed at 03/24/2021 1050 Gross per 24 hour  Intake 1590 ml  Output --  Net 1590 ml   Filed Weights   03/23/21 1930  Weight: 53 kg    Examination: General: Chronically ill-appearing, HENT: Moist oral mucosa Lungs: Rales bilaterally Cardiovascular: S1-S2 appreciated Abdomen: Soft, bowel sounds  appreciated Extremities: No clubbing, no edema Neuro: Alert and oriented x3 GU: Output  Resolved Hospital Problem list     Assessment & Plan:  Abnormal CT scan of the chest showing a 7.5 x 7 cm left lower lobe, lateral basal mass, extensive fibrosis-consistent with UIP  With a history of smoking, fibrosis, no significant symptoms of an infectious process prior to coming into the hospital -Finding on the CT is concerning for a primary neoplastic process -This will require biopsy  Options include CT-guided biopsy as masses pleural-based versus bronchoscopic biopsies -Options discussed with the patient -CT scan reviewed with the patient  Pulmonary fibrosis -Appears to be advanced disease -Chest x-ray report on care everywhere from 09/22/2015 reports coarsening of interstitium concerning for chronic interstitial lung disease -No PFT on record  Patient would like to be referred to Dr. Marin Olp for an opinion  Presentation does not appear to be related to a pneumonia/infectious process  Regarding biopsy bronchoscopic biopsy -Though the mass is sizable , may need advanced bronchoscopic interventions-should be scheduled to see Dr. Lamonte Sakai or Icard -Lateral lower lobe, though there appears to be narrowing of the lateral left lower lobe bronchus-this may not be easily visualized during bronchoscopy  A CT-guided biopsy as the mass is pleural-based may also be a consideration  Needs follow-up with pulmonary as well for pulmonary fibrosis  Discussed with Dr. Garen Lah, MD [place] [facility] Pager: See Shea Evans

## 2021-03-25 DIAGNOSIS — R42 Dizziness and giddiness: Secondary | ICD-10-CM | POA: Diagnosis not present

## 2021-03-25 DIAGNOSIS — R918 Other nonspecific abnormal finding of lung field: Secondary | ICD-10-CM

## 2021-03-25 DIAGNOSIS — D472 Monoclonal gammopathy: Secondary | ICD-10-CM | POA: Diagnosis not present

## 2021-03-25 DIAGNOSIS — F419 Anxiety disorder, unspecified: Secondary | ICD-10-CM | POA: Diagnosis not present

## 2021-03-25 LAB — PROCALCITONIN: Procalcitonin: <0.1 ng/mL

## 2021-03-25 MED ORDER — DICLOFENAC SODIUM 1 % EX GEL
2.0000 g | Freq: Four times a day (QID) | CUTANEOUS | Status: DC
  Administered 2021-03-25 – 2021-03-26 (×5): 2 g via TOPICAL
  Filled 2021-03-25: qty 100

## 2021-03-25 NOTE — Consult Note (Signed)
Chief Complaint: Patient was seen in consultation today for CT-guided left lung mass biopsy Chief Complaint  Patient presents with   Fall   Dizziness   Chest Pain    Referring Physician(s): Byrum,R  Supervising Physician: Daryll Brod  Patient Status: [facility] - In-pt  History of Present Illness: Molly Welch is a 75 y.o. female smoker with past medical history of MGUS, neuropathy, COPD, and anxiety/depression who recently presented to [facility] with unsteady gait which has been progressive over the last few months associated with some falls in addition to worsening hand tremors.  She had also complained of occasional headaches as well as some chest tightness and chronic cough.  MRI of the brain showed no acute intracranial hemorrhage or infarct but some moderate parenchymal volume loss and chronic white matter microangiopathy.  Chest CT revealed 7.5 cm left lower lobe mass which contacts lateral pleural space/chest wall, pulmonary fibrosis/UIP, coronary artery disease, aortic atherosclerosis and emphysema. No PE noted.  Patient was evaluated by pulmonology and they felt that she was a high risk to place under anesthesia for bronchoscopy. Request now received for CT-guided left lung mass biopsy for further evaluation.  Past Medical History:  Diagnosis Date   Abnormal chest x-ray 2005   COPD changes (2005) + interstitial lung dz changes (2002)   Anxiety and depression    Prozac, celexa, paxil, wellbutrin, and buspar all failures.  Pt has stated xanax is only med that helps.   Tobacco dependence     Past Surgical History:  Procedure Laterality Date   ABDOMINAL HYSTERECTOMY  1979   APPENDECTOMY  1966    Allergies: Doxycycline, Prednisone, Penicillins, and Sulfonamide derivatives  Medications: Prior to Admission medications   Medication Sig Start Date End Date Taking? Authorizing Provider  acetaminophen (TYLENOL) 500 MG tablet Take 500 mg by mouth daily as  needed for headache (pain).   Yes [provider]  ALPRAZolam Duanne Moron) 1 MG tablet Take 0.5-1 mg by mouth See admin instructions. Take one tablet (1 mg) by mouth at 5:30am, take 1/2 tablet (0.5 mg) at 7:30am, 3pm and at bedtime. 03/04/21  Yes [provider]  Ascorbic Acid (VITAMIN C) 1000 MG tablet Take 1,000 mg by mouth every morning.   Yes [provider]  azelastine (OPTIVAR) 0.05 % ophthalmic solution Place 1 drop into both eyes 2 (two) times daily as needed (dry eyes/itching).   Yes [provider]  diclofenac Sodium (VOLTAREN) 1 % GEL Apply 1 application topically at bedtime.   Yes [provider]  gabapentin (NEURONTIN) 300 MG capsule Take 300 mg by mouth at bedtime as needed (nerve pain/burning in feet). 01/26/21  Yes [provider]  hypromellose (SYSTANE OVERNIGHT THERAPY) 0.3 % GEL ophthalmic ointment Place 1 application into both eyes at bedtime as needed for dry eyes.   Yes [provider]  ibuprofen (ADVIL) 200 MG tablet Take 400 mg by mouth daily as needed for headache (pain).   Yes [provider]  lamoTRIgine (LAMICTAL) 25 MG tablet Take 50 mg by mouth every morning. 05/01/16  Yes [provider]  lipase/protease/amylase (CREON) 36000 UNITS CPEP capsule Take 36,000 Units by mouth See admin instructions. Take two capsules (72000 units) by mouth three times daily with meals and one capsule (36000 units) with snacks   Yes [provider]  Multiple Vitamin (MULTIVITAMIN WITH MINERALS) TABS tablet Take 1 tablet by mouth every morning. Centrum   Yes [provider]  ondansetron (  ZOFRAN) 4 MG tablet Take 4 mg by mouth every 6 (six) hours as needed for nausea or vomiting. 03/14/21  Yes [provider]  Probiotic Product (PROBIOTIC PO) Take 1 tablet by mouth every morning.   Yes [provider]  valACYclovir (VALTREX) 500 MG tablet Take 500 mg by mouth every morning.   Yes [provider]  ALPRAZolam Duanne Moron) 0.5 MG tablet Take 0.5 mg by mouth 3 (three) times daily as needed for anxiety.  Patient not taking: No sig reported 03/20/16   [provider]  amitriptyline (ELAVIL) 50 MG tablet Take 1 tablet (50 mg total) by mouth at bedtime. Patient not taking: No sig reported 02/18/21   Ventura Sellers, MD  DULoxetine (CYMBALTA) 30 MG capsule Take 1 capsule (30 mg total) by mouth daily. Patient not taking: No sig reported 12/24/20   Ventura Sellers, MD  lamoTRIgine (LAMICTAL) 100 MG tablet Take 100 mg by mouth daily. Patient not taking: No sig reported    [provider]  Pancrelipase, Lip-Prot-Amyl, (CREON PO) 2 capsules with each meal and 1 capsule with each snack Patient not taking: No sig reported 09/20/18   [provider]  primidone (MYSOLINE) 50 MG tablet Take 1 tablet (50 mg total) by mouth at bedtime. Patient not taking: No sig reported 10/18/20   Ventura Sellers, MD     Family History  Problem Relation Age of Onset   Diabetes Other    Suicidality Father    Heart disease Brother     Social History   Socioeconomic History   Marital status: Single    Spouse name: Not on file   Number of children: Not on file   Years of education: Not on file   Highest education level: Not on file  Occupational History   Not on file  Tobacco Use   Smoking status: Every Day    Packs/day: 0.50    Years: 55.00    Pack years: 27.50    Types: Cigarettes   Smokeless tobacco: Never  Substance and Sexual Activity   Alcohol use: No   Drug use: No   Sexual activity: Not on file  Other Topics Concern   Not on file  Social History Narrative   Not on file   Social Determinants of Health   Financial Resource Strain: Not on file  Food Insecurity: Not on file  Transportation Needs: Not on file  Physical Activity: Not on file  Stress: Not on file  Social Connections: Not on file      Review of Systems see above ;currently denies  fever, chest pain, abdominal pain, vomiting or bleeding; does have occasional headaches, dyspnea with exertion, occ nausea, chronic cough, hand tremors and back pain  Vital Signs: BP 131/86 (BP Location: Left Arm)   Pulse 85   Temp 98.6 F (37 C) (Oral)   Resp 15   Ht 5\' 2"  (1.575 m)   Wt 116 lb 13.5 oz (53 kg)   SpO2 98%   BMI 21.37 kg/m   Physical Exam awake, answering questions okay.  Chest with bibasilar crackles noted.  Heart with regular rate and rhythm.  Abdomen soft, positive bowel sounds, nontender.  No lower extremity edema.  Bilat hand tremors noted.  Imaging: DG Chest 2 View  Result Date: 03/23/2021 CLINICAL DATA:  Fall.  Difficulty with gait.  Chest tightness. EXAM: CHEST - 2 VIEW COMPARISON:  February 25, 2004 FINDINGS: Infiltrate identified in the left base. Reticular changes seen in  the periphery of both lungs. The cardiomediastinal silhouette is normal. No other abnormalities. IMPRESSION: New left basilar infiltrate worrisome for pneumonia. Recommend short-term follow-up imaging after treatment to ensure resolution. Reticular changes in the periphery of both lungs suggests interstitial lung disease/fibrosis. Electronically Signed   By: Dorise Bullion III M.D.   On: 03/23/2021 11:22   CT HEAD WO CONTRAST (5MM)  Result Date: 03/23/2021 CLINICAL DATA:  Head trauma. Patient fell twice today. Difficulty with gait. EXAM: CT HEAD WITHOUT CONTRAST TECHNIQUE: Contiguous axial images were obtained from the base of the skull through the vertex without intravenous contrast. COMPARISON:  None. FINDINGS: Brain: No subdural, epidural, or subarachnoid hemorrhage. Ventricles and sulci are unremarkable. No mass effect or midline shift. Cerebellum, brainstem, and basal cisterns are normal. Mild scattered white matter changes. No acute cortical ischemia or infarct. Vascular: No hyperdense vessel or unexpected calcification. Skull: Normal. Negative for fracture or focal lesion. Sinuses/Orbits: No  acute finding. Other: No other abnormalities. IMPRESSION: No acute abnormalities.  Mild chronic white matter changes. Electronically Signed   By: Dorise Bullion III M.D.   On: 03/23/2021 11:21   CT Chest Wo Contrast  Result Date: 03/23/2021 CLINICAL DATA:  75 year old female with chest discomfort. EXAM: CT CHEST WITHOUT CONTRAST TECHNIQUE: Multidetector CT imaging of the chest was performed following the standard protocol without IV contrast. COMPARISON:  03/23/2021 chest radiograph and prior studies FINDINGS: Cardiovascular: Heart size is normal. Coronary artery and aortic atherosclerotic calcifications are noted. There is no evidence of thoracic aortic aneurysm or pericardial effusion. Mediastinum/Nodes: No enlarged mediastinal or axillary lymph nodes. Thyroid gland, trachea, and esophagus demonstrate no significant findings. Lungs/Pleura: A 7.5 x 7.2 cm LEFT LOWER lobe mass versus consolidation is identified. Diffuse bilateral subpleural interlobular septal thickening is noted compatible with pulmonary fibrosis/UIP. Centrilobular emphysema is noted. No pleural effusion identified. Upper Abdomen: No acute abnormality. Musculoskeletal: No acute or suspicious bony abnormalities are noted. IMPRESSION: 1. 7.5 x 7.2 cm LEFT LOWER lobe mass/masslike consolidation which may represent malignancy versus infection/pneumonia. If patient has no symptoms of infection/pneumonia, then this is worrisome for malignancy. 2. Pulmonary fibrosis/UIP. 3. Coronary artery disease. 4. Aortic Atherosclerosis (ICD10-I70.0) and Emphysema (ICD10-J43.9). Electronically Signed   By: Margarette Canada M.D.   On: 03/23/2021 15:05   CT Angio Chest Pulmonary Embolism (PE) W or WO Contrast  Result Date: 03/23/2021 CLINICAL DATA:  PE suspected, high probability EXAM: CT ANGIOGRAPHY CHEST WITH CONTRAST TECHNIQUE: Multidetector CT imaging of the chest was performed using the standard protocol during bolus administration of intravenous contrast.  Multiplanar CT image reconstructions and MIPs were obtained to evaluate the vascular anatomy. CONTRAST:  87mL OMNIPAQUE IOHEXOL 350 MG/ML SOLN COMPARISON:  Same day CT chest without contrast FINDINGS: Cardiovascular: Satisfactory opacification of the pulmonary arteries to the segmental level. No evidence of pulmonary embolism. Normal heart size. No pericardial effusion. Aortic atherosclerosis. Mediastinum/Nodes: No enlarged mediastinal, hilar, or axillary lymph nodes. Thyroid gland, trachea, and esophagus demonstrate no significant findings. Lungs/Pleura: Redemonstrated 7.5 cm left lower lobe mass versus consolidation. Diffuse bilateral subpleural interlobular septal thickening, bronchiectasis, and honeycombing, compatible with fibrosis/UIP. Centrilobular emphysema. No pleural effusion. Upper Abdomen: No acute abnormality. No evidence of reflux into the hepatic veins. Musculoskeletal: No acute osseous abnormality Review of the MIP images confirms the above findings. IMPRESSION: 1. Negative for pulmonary embolism to the level of the segmental arteries. 2. Redemonstrated 7.5 cm left lower lobe mass or consolidation, concerning for malignancy versus infection. 3. Pulmonary fibrosis/UIP. 4. Aortic Atherosclerosis (ICD10-I70.0) and Emphysema (ICD10-J43.9).  Electronically Signed   By: Merilyn Baba M.D.   On: 03/23/2021 22:02   MR BRAIN W WO CONTRAST  Result Date: 03/24/2021 CLINICAL DATA:  Unsteady gait, falls EXAM: MRI HEAD WITHOUT AND WITH CONTRAST TECHNIQUE: Multiplanar, multiecho pulse sequences of the brain and surrounding structures were obtained without and with intravenous contrast. CONTRAST:  82mL GADAVIST GADOBUTROL 1 MMOL/ML IV SOLN COMPARISON:  Noncontrast CT head 1 day prior FINDINGS: Brain: There is no evidence of acute intracranial hemorrhage, extra-axial fluid collection, or acute infarct. There is moderate global parenchymal volume loss with commensurate enlargement of the ventricular system.  Extensive foci of FLAIR signal abnormality in the subcortical and periventricular white matter likely reflects sequela of chronic white matter microangiopathy. There is no suspicious parenchymal signal abnormality. There is no mass lesion. There is no midline shift. Incidental note is made of a left cerebellar hemisphere developmental venous anomaly. There is no abnormal enhancement. Vascular: Normal flow voids. Skull and upper cervical spine: Normal marrow signal. Sinuses/Orbits: The paranasal sinuses are clear. Bilateral lens implants are in place. The globes and orbits are otherwise unremarkable. Other: None. IMPRESSION: 1. No acute intracranial hemorrhage or infarct. 2. Moderate parenchymal volume loss and chronic white matter microangiopathy. Electronically Signed   By: Valetta Mole M.D.   On: 03/24/2021 10:12    Labs:  CBC: Recent Labs    10/05/20 1350 03/23/21 1111 03/24/21 0550  WBC 11.0* 9.9 8.4  HGB 12.4 12.0 11.1*  HCT 38.9 37.5 35.4*  PLT 432* 474* 431*    COAGS: No results for input(s): INR, APTT in the last 8760 hours.  BMP: Recent Labs    10/05/20 1350 03/23/21 1111 03/24/21 0550  NA 141 139 146*  K 3.5 3.8 3.5  CL 100 103 110  CO2 28 28 28   GLUCOSE 72 93 82  BUN 10 9 8   CALCIUM 9.1 9.0 9.1  CREATININE 0.70 0.58 0.38*  GFRNONAA >60 >60 >60    LIVER FUNCTION TESTS: Recent Labs    10/05/20 1350 03/23/21 1111 03/24/21 0550  BILITOT 0.2* 0.6 0.4  AST 18 17 15   ALT 12 10 10   ALKPHOS 87 84 71  PROT 7.6 7.1 6.5  ALBUMIN 3.8 3.3* 3.1*    TUMOR MARKERS: No results for input(s): AFPTM, CEA, CA199, CHROMGRNA in the last 8760 hours.  Assessment and Plan: 76 y.o. female smoker with past medical history of MGUS, neuropathy, COPD, and anxiety/depression who recently presented to [facility] with unsteady gait which has been progressive over the last few months associated with some falls in addition to worsening hand tremors.  She had also complained of  occasional headaches as well as some chest tightness and chronic cough.  MRI of the brain showed no acute intracranial hemorrhage or infarct but some moderate parenchymal volume loss and chronic white matter microangiopathy.  Chest CT revealed 7.5 cm left lower lobe mass which contacts lateral pleural space/chest wall, pulmonary fibrosis/UIP, coronary artery disease, aortic atherosclerosis and emphysema. No PE noted.  Patient was evaluated by pulmonology and they felt that she was a high risk to place under anesthesia for bronchoscopy. Request now received for CT-guided left lung mass biopsy for further evaluation.  Imaging studies have been reviewed by Dr. Kathlene Cote.Risks and benefits of procedure was discussed with the patient and/or patient's family including, but not limited to bleeding, infection, damage to adjacent structures, pneumothorax requiring chest tube or low yield requiring additional tests.  All of the questions were answered and there is  agreement to proceed.  Consent signed and in chart.  Procedure tentatively scheduled for 9/20  Current labs include WBC 8.4, hemoglobin 11.1, platelets 431k, creat 0.38, COVID-19 negative, PT/INR pending  Thank you for this interesting consult.  I greatly enjoyed meeting Molly Welch and look forward to participating in their care.  A copy of this report was sent to the requesting provider on this date.  Electronically Signed: D. Rowe Robert, PA-C 03/25/2021, 2:06 PM   I spent a total of  25 minutes  in face to face in clinical consultation, greater than 50% of which was counseling/coordinating care for CT-guided left lung mass biopsy

## 2021-03-25 NOTE — Progress Notes (Signed)
   NAME:  Molly Welch, MRN:  [id number], DOB:  1945/08/10, LOS: 1 ADMISSION DATE:  03/23/2021, CONSULTATION DATE:  03/24/2021 REFERRING MD:  Dr Cruzita Lederer, CHIEF COMPLAINT:  abnormal Chest CT   History of Present Illness:  Patient admitted with recent falls [place] [address] twice, related to weakness, dizziness, Admits to chest discomfort, chronic intermittent dry cough Has been having issues with tremors.  History of neuropathy, benign monoclonal gammopathy, emphysema Active smoker, history of anxiety  Chronic dry cough, intermittent No weight loss, no hemoptysis Appetite is fair No personal history of cancer Office work  She smokes about half a pack a day down from over 1 pack a day for many years  Pertinent  Medical History   Past Medical History:  Diagnosis Date   Abnormal chest x-ray 2005   COPD changes (2005) + interstitial lung dz changes (2002)   Anxiety and depression    Prozac, celexa, paxil, wellbutrin, and buspar all failures.  Pt has stated xanax is only med that helps.   Tobacco dependence    Significant Hospital Events: Including procedures, antibiotic start and stop dates in addition to other pertinent events   Abnormal CT chest, MRI head, CT head within normal limits-mild chronic white matter changes  Interim History / Subjective:  History of emphysema Was functioning well prior to recent falls, only being seen for tremors  Objective   Blood pressure 127/81, pulse 80, temperature 98.1 F (36.7 C), temperature source Oral, resp. rate 18, height 5\' 2"  (1.575 m), weight 53 kg, SpO2 93 %.        Intake/Output Summary (Last 24 hours) at 03/25/2021 1332 Last data filed at 03/25/2021 4580 Gross per 24 hour  Intake 1180 ml  Output --  Net 1180 ml   Filed Weights   03/23/21 1930  Weight: 53 kg    Examination: General: thin, sitting up in chair, no distress HENT: Oropharynx moist, some periorbital edema Lungs: Bilateral inspiratory crackles Cardiovascular: Regular,  no murmur Abdomen: Nondistended, positive bowel sounds Extremities: No edema Neuro: Awake, Alert, appropriate, nonfocal  Resolved Hospital Problem list     Assessment & Plan:  Abnormal CT scan of the chest showing a 7.5 x 7 cm left lower lobe, lateral basal mass, extensive fibrosis-consistent with UIP, cause unclear  -Discussed pros and cons of FOB with biopsies as compared to TTNA.  There is risk for pneumothorax with either procedure, probably accentuated slightly by the presence of interstitial lung disease.  I think there is additional risk with bronchoscopy given her somewhat tenuous respiratory status and possible inability to tolerate deep sedation.  She does indicate that she wants a tissue diagnosis so that she can have a meaningful discussion with Dr. Marin Olp about treatment options.  I think probably most straightforward and least risky approach would be TTNA with interventional radiology.  She understands that pneumothorax is still a possibility.  Plan discussed with Dr. Kathlene Cote and Dr. Cheryl Flash, MD, PhD 03/25/2021, 1:39 PM [facility] [phone] or if no answer before 7:00PM call [phone] For any issues after 7:00PM please call eLink [phone]

## 2021-03-25 NOTE — Progress Notes (Signed)
Physical Therapy Treatment Patient Details Name: Molly Welch MRN: [id number] DOB: [birth year] Today's Date: 03/25/2021   History of Present Illness patient is a 75 year old female who presented to the hosptial after falls at home. patient was admitted for CAP v.s.malignancy and vertigo. PMH: neuropathy, benign monoclonal gammopathy with tremor, anxiety,    PT Comments    Pt up and moving in room today with improved ability . Pt ambulated with PT in hallway entire length , hand held assist , no LOB, and no reports of dizziness. Pt liked her progress. Performed and educated with a home exercise program that pt and her dtr can follow at home. Pt prefers this over HHPT and OPPT at this time. No further PT at this time, will defer to mobility specialist and nursing to walk with pt in hallway with supervision.     Recommendations for follow up therapy are one component of a multi-disciplinary discharge planning process, led by the attending physician.  Recommendations may be updated based on patient status, additional functional criteria and insurance authorization.  Follow Up Recommendations  No PT follow up (feel pt will improve with HEP and dtrs help , and can transition to OPPT if necessary.)     Equipment Recommendations  None recommended by PT (pt progressed and not usign RW in room today and walked in hallway without it today as well. No DME needed at this time)    Recommendations for Other Services       Precautions / Restrictions Precautions Precautions: Fall     Mobility  Bed Mobility Overal bed mobility: Independent       Supine to sit: Independent          Transfers Overall transfer level: Modified independent Equipment used: None Transfers: Sit to/from Stand Sit to Stand: Modified independent (Device/Increase time)         General transfer comment: pt was up in room independently at sink doing ADLs and had been to the bathroom with no  AD  Ambulation/Gait Ambulation/Gait assistance: Min guard Gait Distance (Feet): 350 Feet Assistive device: 1 person hand held assist Gait Pattern/deviations: Decreased step length - right;Decreased step length - left Gait velocity: decr   General Gait Details: pt wanted to try without RW since she did not use a RW at baseline. Pt walked with more confidence today, still small steps and slow , but stedy. No dizziness and no LOB   Stairs             Wheelchair Mobility    Modified Rankin (Stroke Patients Only)       Balance Overall balance assessment: Needs assistance Sitting-balance support: No upper extremity supported Sitting balance-Leahy Scale: Normal     Standing balance support: During functional activity Standing balance-Leahy Scale: Good Standing balance comment: during brushing teeth at sink                            Cognition Arousal/Alertness: Awake/alert Behavior During Therapy: WFL for tasks assessed/performed Overall Cognitive Status: Within Functional Limits for tasks assessed                                 General Comments: patient was plesant and motivated to participate      Exercises Other Exercises Other Exercises: worked with standing exercises while holding on with 1 hand: Marching, toe and heel rasies, side stepping, hip  abduction , single leg stance and tandem stance. [place] program sent to pt's dtr and printed for pt as well. [employer] : Access Code [email or web address]    General Comments        Pertinent Vitals/Pain Pain Assessment: No/denies pain    Home Living                      Prior Function            PT Goals (current goals can now be found in the care plan section) Acute Rehab PT Goals Patient Stated Goal: to get back to work PT Goal Formulation: With patient Time For Goal Achievement: 04/07/21 Potential to Achieve Goals: Good Progress towards PT goals: Goals met/education completed,  patient discharged from PT    Frequency    Min 3X/week      PT Plan Discharge plan needs to be updated    Co-evaluation              AM-PAC PT "6 Clicks" Mobility   Outcome Measure  Help needed turning from your back to your side while in a flat bed without using bedrails?: None Help needed moving from lying on your back to sitting on the side of a flat bed without using bedrails?: None Help needed moving to and from a bed to a chair (including a wheelchair)?: None Help needed standing up from a chair using your arms (e.g., wheelchair or bedside chair)?: None Help needed to walk in hospital room?: None Help needed climbing 3-5 steps with a railing? : A Little 6 Click Score: 23    End of Session Equipment Utilized During Treatment: Gait belt Activity Tolerance: Patient tolerated treatment well Patient left: in chair;with call bell/phone within reach;with nursing/sitter in room Nurse Communication: Mobility status       Time: 1330-1400 PT Time Calculation (min) (ACUTE ONLY): 30 min  Charges:  $Gait Training: 8-22 mins $Therapeutic Exercise: 8-22 mins                     Bryann Mcnealy, PT, MPT Acute Rehabilitation Services Office: [phone] Pager: [phone] 03/25/2021    Molly Welch 03/25/2021, 2:16 PM

## 2021-03-25 NOTE — Progress Notes (Signed)
PROGRESS NOTE  Molly Welch RWE:[id number] DOB: [birth year] DOA: 03/23/2021 PCP: Carolee Rota, NP   LOS: 1 day   Brief Narrative / Interim history: 75 year old female with history of MGUS, neuropathy, COPD, tobacco use, anxiety who comes into the hospital with unsteady gait progressive over the last few months and then couple of falls the day prior to admission.  She also has been having worsening tremors over the last few months for which she is set to see Dr. Carles Collet with neurology in a few days  Subjective / 24h Interval events: No complaints this morning, denies any shortness of breath, no abdominal pain, no nausea or vomiting.  Assessment & Plan: Principal Problem Lung mass, underlying pulmonary fibrosis-new.  She came into the hospital with weakness, initial chest x-ray was concerning for pneumonia and was placed on antibiotics.  A CT scan of the chest showed a large mass versus infiltrate.  She does not have a fever, no WBC, cough is relatively chronic, and clinically she does not appear to have an infectious process going on so pneumonia is ruled out.  Procalcitonin is negative x2.  Discontinue antibiotics and has remained stable. -Has been smoking for 46 years, up until 2011 she would smoke a pack a day then cut that down to half a pack a day.  Pulmonology consulted, discussed with Dr. Lamonte Sakai -She will get CT-guided biopsy  Active Problems Tremor, weakness, recurrent falls, vertigo-PT evaluated patient, recommending home health PT -MRI without acute findings  Anxiety-continue Xanax  MGUS-followed by oncology as an outpatient  Tobacco use-nicotine patch  Scheduled Meds:  guaiFENesin  600 mg Oral BID   lamoTRIgine  25 mg Oral BH-q7a   lipase/protease/amylase  72,000 Units Oral TID AC   nicotine  14 mg Transdermal Daily   valACYclovir  500 mg Oral Daily   Continuous Infusions: PRN Meds:.acetaminophen **OR** acetaminophen, albuterol, ALPRAZolam, gabapentin,  HYDROcodone-acetaminophen, meclizine, ondansetron **OR** ondansetron (ZOFRAN) IV, polyvinyl alcohol  Diet Orders (From admission, onward)     Start     Ordered   03/23/21 2044  Diet Heart Room service appropriate? Yes; Fluid consistency: Thin  Diet effective now       Question Answer Comment  Room service appropriate? Yes   Fluid consistency: Thin      03/23/21 2043            DVT prophylaxis: SCDs Start: 03/23/21 2044     Code Status: DNR  Family Communication: No family at bedside  Status is: Inpatient  Remains inpatient appropriate because:Unsafe d/c plan and Inpatient level of care appropriate due to severity of illness  Dispo: The patient is from: Home              Anticipated d/c is to: Home              Patient currently is not medically stable to d/c.   Difficult to place patient No  Level of care: Telemetry  Consultants:  Pulmonology  Procedures:  none  Microbiology  none  Antimicrobials: Ceftriaxone/azithromycin 9/17-9/18.   Objective: Vitals:   03/24/21 1528 03/24/21 1530 03/24/21 2200 03/25/21 0446  BP: (!) 164/95 138/86 128/76 127/81  Pulse: 84 85 73 80  Resp:   18 18  Temp:   98 F (36.7 C) 98.1 F (36.7 C)  TempSrc:   Oral Oral  SpO2: 96% 97% 94% 93%  Weight:      Height:        Intake/Output Summary (Last 24 hours) at 03/25/2021 1334  Last data filed at 03/25/2021 2353 Gross per 24 hour  Intake 1180 ml  Output --  Net 1180 ml    Filed Weights   03/23/21 1930  Weight: 53 kg    Examination:  Constitutional: She is in no distress, sitting in the chair Eyes: Anicteric ENMT: Mucous membranes are moist.  Neck: normal, supple Respiratory: Distant breath sounds, coarse at the bases.  No wheezing, moves air well Cardiovascular: Regular rate and rhythm, no murmurs, no peripheral edema Abdomen: Soft, NT, ND, bowel sounds positive Musculoskeletal: no clubbing / cyanosis.  Skin: No rashes seen Neurologic: No focal  deficits  Data Reviewed: I have independently reviewed following labs and imaging studies   CBC: Recent Labs  Lab 03/23/21 1111 03/24/21 0550  WBC 9.9 8.4  NEUTROABS 6.9 4.9  HGB 12.0 11.1*  HCT 37.5 35.4*  MCV 87.0 87.6  PLT 474* 431*    Basic Metabolic Panel: Recent Labs  Lab 03/23/21 1111 03/23/21 2104 03/24/21 0550  NA 139  --  146*  K 3.8  --  3.5  CL 103  --  110  CO2 28  --  28  GLUCOSE 93  --  82  BUN 9  --  8  CREATININE 0.58  --  0.38*  CALCIUM 9.0  --  9.1  MG  --  1.9 2.1  PHOS  --  3.4 3.3    Liver Function Tests: Recent Labs  Lab 03/23/21 1111 03/24/21 0550  AST 17 15  ALT 10 10  ALKPHOS 84 71  BILITOT 0.6 0.4  PROT 7.1 6.5  ALBUMIN 3.3* 3.1*    Coagulation Profile: No results for input(s): INR, PROTIME in the last 168 hours. HbA1C: No results for input(s): HGBA1C in the last 72 hours. CBG: Recent Labs  Lab 03/23/21 1116 03/24/21 2201  GLUCAP 90 92     Recent Results (from the past 240 hour(s))  Resp Panel by RT-PCR (Flu A&B, Covid) Nasopharyngeal Swab     Status: None   Collection Time: 03/23/21  2:40 PM   Specimen: Nasopharyngeal Swab; Nasopharyngeal(NP) swabs in vial transport medium  Result Value Ref Range Status   SARS Coronavirus 2 by RT PCR NEGATIVE NEGATIVE Final    Comment: (NOTE) SARS-CoV-2 target nucleic acids are NOT DETECTED.  The SARS-CoV-2 RNA is generally detectable in upper respiratory specimens during the acute phase of infection. The lowest concentration of SARS-CoV-2 viral copies this assay can detect is 138 copies/mL. A negative result does not preclude SARS-Cov-2 infection and should not be used as the sole basis for treatment or other patient management decisions. A negative result may occur with  improper specimen collection/handling, submission of specimen other than nasopharyngeal swab, presence of viral mutation(s) within the areas targeted by this assay, and inadequate number of viral copies(<138  copies/mL). A negative result must be combined with clinical observations, patient history, and epidemiological information. The expected result is Negative.  Fact Sheet for Patients:  EntrepreneurPulse.com.au  Fact Sheet for Healthcare Providers:  IncredibleEmployment.be  This test is no t yet approved or cleared by the [place] FDA and  has been authorized for detection and/or diagnosis of SARS-CoV-2 by FDA under an Emergency Use Authorization (EUA). This EUA will remain  in effect (meaning this test can be used) for the duration of the COVID-19 declaration under Section 564(b)(1) of the Act, 21 U.S.C.section 360bbb-3(b)(1), unless the authorization is terminated  or revoked sooner.       Influenza A by PCR NEGATIVE  NEGATIVE Final   Influenza B by PCR NEGATIVE NEGATIVE Final    Comment: (NOTE) The Xpert Xpress SARS-CoV-2/FLU/RSV plus assay is intended as an aid in the diagnosis of influenza from Nasopharyngeal swab specimens and should not be used as a sole basis for treatment. Nasal washings and aspirates are unacceptable for Xpert Xpress SARS-CoV-2/FLU/RSV testing.  Fact Sheet for Patients: EntrepreneurPulse.com.au  Fact Sheet for Healthcare Providers: IncredibleEmployment.be  This test is not yet approved or cleared by the [place] FDA and has been authorized for detection and/or diagnosis of SARS-CoV-2 by FDA under an Emergency Use Authorization (EUA). This EUA will remain in effect (meaning this test can be used) for the duration of the COVID-19 declaration under Section 564(b)(1) of the Act, 21 U.S.C. section 360bbb-3(b)(1), unless the authorization is terminated or revoked.  Performed at [facility], [address], [place], [place] [address]       Radiology Studies: No results found.   Marzetta Board, MD, PhD [facility]  Between 7 am - 7 pm I am  available, please contact me via Amion (for emergencies) or Securechat (non urgent messages)  Between 7 pm - 7 am I am not available, please contact night coverage MD/APP via Amion

## 2021-03-26 ENCOUNTER — Inpatient Hospital Stay (HOSPITAL_COMMUNITY): Payer: Medicare Other

## 2021-03-26 ENCOUNTER — Encounter (HOSPITAL_COMMUNITY): Payer: Self-pay | Admitting: Internal Medicine

## 2021-03-26 DIAGNOSIS — D472 Monoclonal gammopathy: Secondary | ICD-10-CM | POA: Diagnosis not present

## 2021-03-26 DIAGNOSIS — R918 Other nonspecific abnormal finding of lung field: Secondary | ICD-10-CM | POA: Diagnosis not present

## 2021-03-26 DIAGNOSIS — R42 Dizziness and giddiness: Secondary | ICD-10-CM | POA: Diagnosis not present

## 2021-03-26 DIAGNOSIS — F419 Anxiety disorder, unspecified: Secondary | ICD-10-CM | POA: Diagnosis not present

## 2021-03-26 LAB — CBC WITH DIFFERENTIAL/PLATELET
Abs Immature Granulocytes: 0.04 10*3/uL (ref 0.00–0.07)
Basophils Absolute: 0.1 10*3/uL (ref 0.0–0.1)
Basophils Relative: 1 %
Eosinophils Absolute: 0.1 10*3/uL (ref 0.0–0.5)
Eosinophils Relative: 1 %
HCT: 38.1 % (ref 36.0–46.0)
Hemoglobin: 12.1 g/dL (ref 12.0–15.0)
Immature Granulocytes: 0 %
Lymphocytes Relative: 21 %
Lymphs Abs: 2.2 10*3/uL (ref 0.7–4.0)
MCH: 27.7 pg (ref 26.0–34.0)
MCHC: 31.8 g/dL (ref 30.0–36.0)
MCV: 87.2 fL (ref 80.0–100.0)
Monocytes Absolute: 0.9 10*3/uL (ref 0.1–1.0)
Monocytes Relative: 9 %
Neutro Abs: 7.1 10*3/uL (ref 1.7–7.7)
Neutrophils Relative %: 68 %
Platelets: 429 10*3/uL — ABNORMAL HIGH (ref 150–400)
RBC: 4.37 MIL/uL (ref 3.87–5.11)
RDW: 13.2 % (ref 11.5–15.5)
WBC: 10.5 10*3/uL (ref 4.0–10.5)
nRBC: 0 % (ref 0.0–0.2)

## 2021-03-26 LAB — PROTIME-INR
INR: 1 (ref 0.8–1.2)
Prothrombin Time: 13.1 seconds (ref 11.4–15.2)

## 2021-03-26 MED ORDER — MIDAZOLAM HCL 2 MG/2ML IJ SOLN
INTRAMUSCULAR | Status: DC | PRN
  Administered 2021-03-26: 1 mg via INTRAVENOUS

## 2021-03-26 MED ORDER — LIDOCAINE HCL (PF) 1 % IJ SOLN
INTRAMUSCULAR | Status: DC | PRN
  Administered 2021-03-26: 10 mL

## 2021-03-26 MED ORDER — FENTANYL CITRATE (PF) 100 MCG/2ML IJ SOLN
INTRAMUSCULAR | Status: AC
  Filled 2021-03-26: qty 2

## 2021-03-26 MED ORDER — MIDAZOLAM HCL 2 MG/2ML IJ SOLN
INTRAMUSCULAR | Status: AC
  Filled 2021-03-26: qty 4

## 2021-03-26 MED ORDER — SODIUM CHLORIDE 0.9 % IV SOLN
INTRAVENOUS | Status: AC
  Filled 2021-03-26: qty 250

## 2021-03-26 NOTE — Procedures (Signed)
Interventional Radiology Procedure:   Indications: Left lung mass  Procedure: CT guided left lung mass biopsy  Findings: 3 cores from left lung mass, no immediate complication.  Specimens look like necrotic material.  Complications: No immediate complications noted.     EBL: Minimal  Plan: CXR in 1 hour.  Keep NPO until CXR report.  Bedrest for 2 hours.    Trasean Delima R. Anselm Pancoast, MD  Pager: [phone]

## 2021-03-26 NOTE — Progress Notes (Signed)
Mobility Specialist - Cancellation Note    03/26/21 1152  Mobility  Activity Off unit    Pt off unit for biopsy. Will check back as schedule permits.   [place] Specialist [facility] Services Phone: [phone] 03/26/21, 11:53 AM

## 2021-03-26 NOTE — Progress Notes (Signed)
PROGRESS NOTE  Molly Welch QPY:[id number] DOB: [birth year] DOA: 03/23/2021 PCP: Carolee Rota, NP   LOS: 2 days   Brief Narrative / Interim history: 75 year old female with history of MGUS, neuropathy, COPD, tobacco use, anxiety who comes into the hospital with unsteady gait progressive over the last few months and then couple of falls the day prior to admission.  She also has been having worsening tremors over the last few months for which she is set to see Dr. Carles Collet with neurology in a few days  Subjective / 24h Interval events: She had the biopsy this morning.  Postprocedure chest x-ray without pneumothorax.  Her daughter is quite sick she tells me, very concerned about going home this evening as there is nobody to stay with her and help her.  Assessment & Plan: Principal Problem Lung mass, underlying pulmonary fibrosis-new.  She came into the hospital with weakness, initial chest x-ray was concerning for pneumonia and was placed on antibiotics.  A CT scan of the chest showed a large mass versus infiltrate.  She does not have a fever, no WBC, cough is relatively chronic, and clinically she does not appear to have an infectious process going on so pneumonia is ruled out.  Procalcitonin is negative x2.  Discontinue antibiotics and has remained stable. -Has been smoking for 46 years, up until 2011 she would smoke a pack a day then cut that down to half a pack a day.  Pulmonology consulted, discussed with Dr. Lamonte Sakai -Status post CT-guided biopsy today, follow-up chest x-ray without pneumothorax -She will have no assistance if she goes home this evening, her daughter is sick, plan to discharge tomorrow morning  Active Problems Tremor, weakness, recurrent falls, vertigo-PT evaluated patient, recommending home health PT -MRI of the brain without acute findings  Anxiety-continue Xanax  MGUS-followed by oncology as an outpatient  Tobacco use-nicotine patch  Scheduled Meds:  diclofenac  Sodium  2 g Topical QID   fentaNYL       guaiFENesin  600 mg Oral BID   lamoTRIgine  25 mg Oral BH-q7a   lipase/protease/amylase  72,000 Units Oral TID AC   midazolam       nicotine  14 mg Transdermal Daily   valACYclovir  500 mg Oral Daily   Continuous Infusions:  sodium chloride     PRN Meds:.acetaminophen **OR** acetaminophen, albuterol, ALPRAZolam, gabapentin, HYDROcodone-acetaminophen, lidocaine (PF), meclizine, midazolam, ondansetron **OR** ondansetron (ZOFRAN) IV, polyvinyl alcohol  Diet Orders (From admission, onward)     Start     Ordered   03/26/21 1428  Diet regular Room service appropriate? Yes; Fluid consistency: Thin  Diet effective now       Question Answer Comment  Room service appropriate? Yes   Fluid consistency: Thin      03/26/21 1427            DVT prophylaxis: SCDs Start: 03/23/21 2044     Code Status: DNR  Family Communication: No family at bedside  Status is: Inpatient  Remains inpatient appropriate because:Unsafe d/c plan and Inpatient level of care appropriate due to severity of illness  Dispo: The patient is from: Home              Anticipated d/c is to: Home              Patient currently is not medically stable to d/c.   Difficult to place patient No  Level of care: Med-Surg  Consultants:  Pulmonology  Procedures:  none  Microbiology  none  Antimicrobials: Ceftriaxone/azithromycin 9/17-9/18.   Objective: Vitals:   03/26/21 1140 03/26/21 1145 03/26/21 1150 03/26/21 1400  BP: 118/69 130/65 126/71 140/77  Pulse: 79 80 81 80  Resp: (!) 26 (!) 27 (!) 23 16  Temp:    98.1 F (36.7 C)  TempSrc:    Oral  SpO2: 100% 100% 100% 97%  Weight:      Height:        Intake/Output Summary (Last 24 hours) at 03/26/2021 1513 Last data filed at 03/26/2021 0541 Gross per 24 hour  Intake 120 ml  Output --  Net 120 ml    Filed Weights   03/23/21 1930  Weight: 53 kg    Examination:  Constitutional: No distress Eyes: No  scleral icterus ENMT:  mmm Neck: normal, supple Respiratory: Distant breath sounds, no wheezing Cardiovascular: Regular rate and rhythm, no murmurs, no edema Abdomen: Soft, NT, ND, positive bowel sounds Musculoskeletal: no clubbing / cyanosis.  Skin: No rashes Neurologic: Nonfocal  Data Reviewed: I have independently reviewed following labs and imaging studies   CBC: Recent Labs  Lab 03/23/21 1111 03/24/21 0550 03/26/21 0531  WBC 9.9 8.4 10.5  NEUTROABS 6.9 4.9 7.1  HGB 12.0 11.1* 12.1  HCT 37.5 35.4* 38.1  MCV 87.0 87.6 87.2  PLT 474* 431* 429*    Basic Metabolic Panel: Recent Labs  Lab 03/23/21 1111 03/23/21 2104 03/24/21 0550  NA 139  --  146*  K 3.8  --  3.5  CL 103  --  110  CO2 28  --  28  GLUCOSE 93  --  82  BUN 9  --  8  CREATININE 0.58  --  0.38*  CALCIUM 9.0  --  9.1  MG  --  1.9 2.1  PHOS  --  3.4 3.3    Liver Function Tests: Recent Labs  Lab 03/23/21 1111 03/24/21 0550  AST 17 15  ALT 10 10  ALKPHOS 84 71  BILITOT 0.6 0.4  PROT 7.1 6.5  ALBUMIN 3.3* 3.1*    Coagulation Profile: Recent Labs  Lab 03/26/21 0531  INR 1.0   HbA1C: No results for input(s): HGBA1C in the last 72 hours. CBG: Recent Labs  Lab 03/23/21 1116 03/24/21 2201  GLUCAP 90 92     Recent Results (from the past 240 hour(s))  Resp Panel by RT-PCR (Flu A&B, Covid) Nasopharyngeal Swab     Status: None   Collection Time: 03/23/21  2:40 PM   Specimen: Nasopharyngeal Swab; Nasopharyngeal(NP) swabs in vial transport medium  Result Value Ref Range Status   SARS Coronavirus 2 by RT PCR NEGATIVE NEGATIVE Final    Comment: (NOTE) SARS-CoV-2 target nucleic acids are NOT DETECTED.  The SARS-CoV-2 RNA is generally detectable in upper respiratory specimens during the acute phase of infection. The lowest concentration of SARS-CoV-2 viral copies this assay can detect is 138 copies/mL. A negative result does not preclude SARS-Cov-2 infection and should not be used as the  sole basis for treatment or other patient management decisions. A negative result may occur with  improper specimen collection/handling, submission of specimen other than nasopharyngeal swab, presence of viral mutation(s) within the areas targeted by this assay, and inadequate number of viral copies(<138 copies/mL). A negative result must be combined with clinical observations, patient history, and epidemiological information. The expected result is Negative.  Fact Sheet for Patients:  EntrepreneurPulse.com.au  Fact Sheet for Healthcare Providers:  IncredibleEmployment.be  This test is no t yet approved or cleared by the [place]  States FDA and  has been authorized for detection and/or diagnosis of SARS-CoV-2 by FDA under an Emergency Use Authorization (EUA). This EUA will remain  in effect (meaning this test can be used) for the duration of the COVID-19 declaration under Section 564(b)(1) of the Act, 21 U.S.C.section 360bbb-3(b)(1), unless the authorization is terminated  or revoked sooner.       Influenza A by PCR NEGATIVE NEGATIVE Final   Influenza B by PCR NEGATIVE NEGATIVE Final    Comment: (NOTE) The Xpert Xpress SARS-CoV-2/FLU/RSV plus assay is intended as an aid in the diagnosis of influenza from Nasopharyngeal swab specimens and should not be used as a sole basis for treatment. Nasal washings and aspirates are unacceptable for Xpert Xpress SARS-CoV-2/FLU/RSV testing.  Fact Sheet for Patients: EntrepreneurPulse.com.au  Fact Sheet for Healthcare Providers: IncredibleEmployment.be  This test is not yet approved or cleared by the [place] FDA and has been authorized for detection and/or diagnosis of SARS-CoV-2 by FDA under an Emergency Use Authorization (EUA). This EUA will remain in effect (meaning this test can be used) for the duration of the COVID-19 declaration under Section 564(b)(1) of the  Act, 21 U.S.C. section 360bbb-3(b)(1), unless the authorization is terminated or revoked.  Performed at [facility], [address], [place], [place] [address]       Radiology Studies: CT BIOPSY  Result Date: 04-24-21 INDICATION: 75 year old with a left lung mass.  Tissue diagnosis is needed. EXAM: CT-GUIDED BIOPSY OF LEFT LUNG MASS MEDICATIONS: None. ANESTHESIA/SEDATION: Moderate (conscious) sedation was employed during this procedure. A total of Versed 1.0 mg and Fentanyl 50 mcg was administered intravenously. Moderate Sedation Time: 15 minutes. The patient's level of consciousness and vital signs were monitored continuously by radiology nursing throughout the procedure under my direct supervision. FLUOROSCOPY TIME:  None COMPLICATIONS: None immediate. PROCEDURE: Informed written consent was obtained from the patient after a thorough discussion of the procedural risks, benefits and alternatives. All questions were addressed. Maximal Sterile Barrier Technique was utilized including caps, mask, sterile gowns, sterile gloves, sterile drape, hand hygiene and skin antiseptic. A timeout was performed prior to the initiation of the procedure. Patient was placed on the CT scanner with the left side mildly elevated. CT images through the chest were obtained. Mass in the left lower lobe was identified. The left lower lateral chest was prepped with chlorhexidine and sterile field was created. Skin and soft tissues were anesthetized using 1% lidocaine. Small incision was made. Using CT guidance, 17 gauge coaxial needle was directed into the left lung mass. Total of 3 core biopsies were obtained with an 18 gauge core device. 17 gauge needle was removed without complication. Bandage placed over the puncture site. FINDINGS: Again noted is a large mass and/or area of consolidation in the left lower lobe that abuts the pleural surface. Extensive fibrotic changes with honeycombing. Needle position  confirmed within the lesion. No immediate pneumothorax. IMPRESSION: CT-guided core biopsy of the left lower lobe lung mass. Electronically Signed   By: Markus Daft M.D.   On: 24-Apr-2021 12:21   DG Chest Port 1 View  Result Date: 2021-04-24 CLINICAL DATA:  Status post lung biopsy. EXAM: PORTABLE CHEST 1 VIEW COMPARISON:  March 23, 2021. FINDINGS: No definite pneumothorax is noted status post biopsy of left lower lobe lung mass. IMPRESSION: No definite pneumothorax is noted. Electronically Signed   By: Marijo Conception M.D.   On: 2021/04/24 14:21     Marzetta Board, MD, PhD [facility]  Between 7 am - 7 pm I am available, please contact me via Amion (for emergencies) or Securechat (non urgent messages)  Between 7 pm - 7 am I am not available, please contact night coverage MD/APP via Amion

## 2021-03-26 NOTE — Progress Notes (Addendum)
PCCM Interval Note  Note plans for TTNA of patient's left lower lobe mass, planned for today 9/20.  Greatly appreciate interventional radiology assistance. Will follow for results and coordinate care with Dr Marin Olp.   Baltazar Apo, MD, PhD 03/26/2021, 9:06 AM [facility] [phone] or if no answer before 7:00PM call [phone] For any issues after 7:00PM please call eLink [phone]

## 2021-03-26 NOTE — Progress Notes (Signed)
Occupational Therapy Treatment Patient Details Name: Molly Welch MRN: [id number] DOB: [birth year] Today's Date: 03/26/2021   History of present illness patient is a 75 year old female who presented to the hosptial after falls at home. patient was admitted for CAP v.s.malignancy and vertigo. PMH: neuropathy, benign monoclonal gammopathy with tremor, anxiety,   OT comments  Patient reports her "half hour is up" after biopsy and needs to use the bathroom. Patient supervision for ambulation to/from bathroom for safety as she is mildly unsteady and holding onto furniture/wall for stability. Does not need assistance for peri care/clothing management after voiding. Patient requesting to ambulate, utilize rolling walker and able to ambulate length of hallway and back with increased time and at supervision level. Is hopeful she can go home tomorrow with DTR support. Patient stating if she needs to use a walker at home a friend of hers has two and she can borrow one.    Recommendations for follow up therapy are one component of a multi-disciplinary discharge planning process, led by the attending physician.  Recommendations may be updated based on patient status, additional functional criteria and insurance authorization.    Follow Up Recommendations  No OT follow up    Equipment Recommendations  None recommended by OT (pt reports can borrow walker from friend)       Precautions / Restrictions Precautions Precautions: Fall       Mobility Bed Mobility Overal bed mobility: Independent                  Transfers Overall transfer level: Needs assistance Equipment used: None Transfers: Sit to/from Stand Sit to Stand: Supervision         General transfer comment: S for safety    Balance Overall balance assessment: Needs assistance Sitting-balance support: Feet supported Sitting balance-Leahy Scale: Normal     Standing balance support: During functional activity Standing  balance-Leahy Scale: Fair Standing balance comment: static standing without UE support, at least unilateral support dynamic standing                           ADL either performed or assessed with clinical judgement   ADL Overall ADL's : Needs assistance/impaired     Grooming: Wash/dry hands;Supervision/safety;Standing                   Toilet Transfer: Supervision/safety;Ambulation;Regular Glass blower/designer Details (indicate cue type and reason): patient holding onto furniture/walls ambulating to/from bathroom. reports recently back from biopsy "just making sure the medication is worn off" [place] and Hygiene: Supervision/safety;Sit to/from stand;Sitting/lateral lean       Functional mobility during ADLs: Supervision/safety General ADL Comments: patient requesting to ambulate, does utilize rolling walker for increased support due to back from biospy ~30 mins ago. overall supervision level      Cognition Arousal/Alertness: Awake/alert Behavior During Therapy: WFL for tasks assessed/performed Overall Cognitive Status: Within Functional Limits for tasks assessed                                                     Pertinent Vitals/ Pain       Pain Assessment: No/denies pain         Frequency  Min 2X/week        Progress Toward Goals  OT Goals(current  goals can now be found in the care plan section)  Progress towards OT goals: Progressing toward goals  Acute Rehab OT Goals Patient Stated Goal: to get back to work OT Goal Formulation: With patient Time For Goal Achievement: 04/07/21 Potential to Achieve Goals: Good ADL Goals Pt Will Transfer to Toilet: with modified independence;ambulating Pt Will Perform Toileting - Clothing Manipulation and hygiene: with modified independence;sit to/from stand Additional ADL Goal #1: patient to complete UB/LB bathing tasks with MI  in standing to demonstrate  increased functional activity tolerance  Plan Discharge plan needs to be updated       AM-PAC OT "6 Clicks" Daily Activity     Outcome Measure   Help from another person eating meals?: None Help from another person taking care of personal grooming?: A Little Help from another person toileting, which includes using toliet, bedpan, or urinal?: A Little Help from another person bathing (including washing, rinsing, drying)?: A Little Help from another person to put on and taking off regular upper body clothing?: A Little Help from another person to put on and taking off regular lower body clothing?: A Little 6 Click Score: 19    End of Session Equipment Utilized During Treatment: Rolling walker  OT Visit Diagnosis: Unsteadiness on feet (R26.81);Muscle weakness (generalized) (M62.81);Other abnormalities of gait and mobility (R26.89)   Activity Tolerance Patient tolerated treatment well   Patient Left in bed;with call bell/phone within reach;with bed alarm set   Nurse Communication Mobility status        Time: 4462-8638 OT Time Calculation (min): 18 min  Charges: OT General Charges $OT Visit: 1 Visit OT Treatments $Self Care/Home Management : 8-22 mins  Delbert Phenix OT OT pager: [phone]   Rosemary Holms 03/26/2021, 2:06 PM

## 2021-03-27 DIAGNOSIS — R296 Repeated falls: Secondary | ICD-10-CM | POA: Diagnosis not present

## 2021-03-27 NOTE — Progress Notes (Signed)
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  However, her biopsy is not yet back and the nature of the mass is not yet known. We talked about various treatments, but after some discussion we both agreed to just hold on treatment.  She may need treatment for this lung mass and I would hate to start a treatment for tremor at the same time.  Both she and her daughter agree. Peripheral neuropathy, due to IgG kappa monoclonal gammopathy Has been treated by [facility] neurology and Dr. Mickeal Skinner, currently under the care of Dr. Mickeal Skinner Patient is quite unstable.  I think that physical therapy could be of benefit, and she really would like to do that.  She was given a prescription for physical therapy. We talked about [facility].  Patient has previously been very active (she actually still works). Daughter asks for a prescription for a lift chair and a Rollator.  I provided that for her today. Patient had trouble getting on and off her shoes.  We talked about kizik shoes, which may be easier for her to get in and out of. 3.  Right now, we left her follow-up on an as-needed basis, until some of her other treatment plans are more set out.  Subjective:   AANYAH LOA was seen today in the movement disorders clinic for neurologic consultation at the request of Carolee Rota, NP.  The consultation is for the evaluation of tremor and falls.  Patient previously seen by [facility] neurology, Dr. Rexene Alberts.  This was primarily for neuropathy, which was ultimately found to due to IgG kappa monoclonal gammopathy.  Patient has been following with Dr. Mickeal Skinner for that.  Patient is accompanied by her daughter who supplements the history.  Patient was actually just in the hospital this week and discharged yesterday.  While in the hospital, a lung mass  was identified along with a new diagnosis of pulmonary fibrosis.  Lung mass was biopsied.  Results are not back yet.  While in the hospital, the patient did have MRI of the brain with and without gadolinium.  This was nonacute.  There was moderate atrophy and white matter disease.  I personally reviewed this.  I also went over the films with patient and her daughter.  Tremor: Yes.     How long has it been going on? 2 years  At rest or with activation?  Activation - using mouse (will take xanax), writing.  Doesn't affect eating  Fam hx of tremor?  No.  Located where?  Bilateral UE - and both are equal - she is R hand dominant  Affected by caffeine:  No. (Only drinks 1 cup per day)  Affected by alcohol:  doesn't drink any  Affected by stress:  Yes.  , when anxious  Affected by fatigue:  No.  Spills soup if on spoon:  may or may no  Affects ADL's (tying shoes, brushing teeth, etc):  No.  Other Specific Symptoms:  Voice: no change Sleep:   Vivid Dreams:  No.  Acting out dreams:  No. Postural symptoms:  Yes.    Falls?  Yes.  , one in yard when picking up debris and squatted down and fell back and was able to get up ok Bradykinesia symptoms: difficulty regaining balance; was shuffling some but better with PT now Loss of smell:  No.  Loss of taste:  No. Urinary Incontinence:  No., has had few episodes of explosive diarrhea Difficulty Swallowing:  No. N/V:  Yes.  , nausea Lightheaded:  Yes.  , occasionally  Syncope: No. Diplopia:  No. (Had some with elavil)  Prior tremor: cymbalta, elavil, gabapentin, lyrica  ALLERGIES:   Allergies  Allergen Reactions   Doxycycline Swelling   Prednisone Other (See Comments)    Very emotional, crying, angry   Penicillins Rash    Has patient had a PCN reaction causing immediate rash, facial/tongue/throat swelling, SOB or lightheadedness with hypotension: yes Has patient had a PCN reaction causing severe rash involving mucus membranes or skin necrosis:  no Has patient had a PCN reaction that required hospitalization: no Has patient had a PCN reaction occurring within the last 10 years: no If all of the above answers are "NO", then may proceed with Cephalosporin use.    Sulfonamide Derivatives Rash    CURRENT MEDICATIONS:  Current Outpatient Medications  Medication Instructions   acetaminophen (TYLENOL) 500 mg, Oral, Daily PRN   ALPRAZolam (XANAX) 0.5-1 mg, Oral, See admin instructions, Take one tablet (1 mg) by mouth at 5:30am, take 1/2 tablet (0.5 mg) at 7:30am, 3pm and at bedtime.   azelastine (OPTIVAR) 0.05 % ophthalmic solution 1 drop, Both Eyes, 2 times daily PRN   diclofenac Sodium (VOLTAREN) 1 % GEL 1 application, Topical, Daily at bedtime   gabapentin (NEURONTIN) 300 mg, Oral, At bedtime PRN   hypromellose (SYSTANE OVERNIGHT THERAPY) 0.3 % GEL ophthalmic ointment 1 application, Both Eyes, At bedtime PRN   lamoTRIgine (LAMICTAL) 50 mg, Oral, Every morning   lipase/protease/amylase (CREON) 36,000 Units, Oral, See admin instructions, Take two capsules (72000 units) by mouth three times daily with meals and one capsule (36000 units) with snacks   Multiple Vitamin (MULTIVITAMIN WITH MINERALS) TABS tablet 1 tablet, Oral, Every morning, Centrum   ondansetron (ZOFRAN) 4 mg, Oral, Every 6 hours PRN   Probiotic Product (PROBIOTIC PO) 1 tablet, Oral, Every morning   valACYclovir (VALTREX) 500 mg, Oral, Every morning   vitamin C 1,000 mg, Oral, Every morning    Objective:   PHYSICAL EXAMINATION:    VITALS:   Vitals:   03/28/21 1310  BP: 121/81  Pulse: 88  SpO2: 97%  Weight: 114 lb (51.7 kg)  Height: 5\' 2"  (1.575 m)    GEN:  The patient appears stated age and is in NAD. HEENT:  Normocephalic, atraumatic.  The mucous membranes are moist. The superficial temporal arteries are without ropiness or tenderness. CV:  RRR Lungs:  CTAB Neck/HEME:  There are no carotid bruits bilaterally.  Neurological examination:  Orientation: The  patient is alert and oriented x3.  Cranial nerves: There is good facial symmetry.  Extraocular muscles are intact. She has trouble with smooth pursuit.  The visual fields are full to confrontational testing. The speech is fluent and clear. Soft palate rises symmetrically and there is no tongue deviation. Hearing is intact to conversational tone. Sensation: Sensation is intact to light touch throughout (facial, trunk, extremities). Vibration is intact at the bilateral big toe but it is decreased . There is no extinction with double simultaneous stimulation.  Motor: Strength is 5/5 in the bilateral upper and lower extremities.   Shoulder shrug is equal and symmetric.  There is no pronator drift. Deep tendon reflexes: Deep tendon reflexes are 2/4 at the bilateral biceps, triceps, brachioradialis, patella and absent at the bilateral achilles. Plantar responses are downgoing bilaterally.  Movement examination: Tone: There is  normal tone in the bilateral upper extremities.  The tone in the lower extremities is normal.  Abnormal movements: no rest tremor.  Min postural tremor.  She has intention tremor, L>R, mild.  She does have trouble pouring water from one glass to another due to tremor.   Coordination:  There is no decremation with RAM's, with any form of RAMS, including alternating supination and pronation of the forearm, hand opening and closing, finger taps, heel taps and toe taps. Gait and Station: The patient pushes off to arise.  The patient's stride length is decreased.  She is slow and tenuous.  She is not wide based. I have reviewed and interpreted the following labs independently   Chemistry      Component Value Date/Time   NA 146 (H) 03/24/2021 0550   NA 139 11/24/2019 1527   K 3.5 03/24/2021 0550   CL 110 03/24/2021 0550   CO2 28 03/24/2021 0550   BUN 8 03/24/2021 0550   BUN 13 11/24/2019 1527   CREATININE 0.38 (L) 03/24/2021 0550   CREATININE 0.70 10/05/2020 1350      Component  Value Date/Time   CALCIUM 9.1 03/24/2021 0550   ALKPHOS 71 03/24/2021 0550   AST 15 03/24/2021 0550   AST 18 10/05/2020 1350   ALT 10 03/24/2021 0550   ALT 12 10/05/2020 1350   BILITOT 0.4 03/24/2021 0550   BILITOT 0.2 (L) 10/05/2020 1350      Lab Results  Component Value Date   TSH 1.376 03/24/2021   Lab Results  Component Value Date   WBC 10.5 03/26/2021   HGB 12.1 03/26/2021   HCT 38.1 03/26/2021   MCV 87.2 03/26/2021   PLT 429 (H) 03/26/2021      Total time spent on today's visit was 63 minutes, including both face-to-face time and nonface-to-face time.  Time included that spent on review of records (prior notes available to me/labs/imaging if pertinent), discussing treatment and goals, answering patient's questions and coordinating care.  Cc:  Carolee Rota, NP

## 2021-03-27 NOTE — Discharge Summary (Signed)
Physician Discharge Summary  Molly Welch ZOX:[id number] DOB: 01-08-1946 DOA: 03/23/2021  PCP: Carolee Rota, NP  Admit date: 03/23/2021 Discharge date: 03/27/2021  Admitted From: Home Disposition:  HOME.   Recommendations for Outpatient Follow-up:  Follow up with PCP in 1-2 weeks Please obtain BMP/CBC in one week Please follow up with PCCM as recommended.  Please follow up with Neurology as recommended.   Discharge Condition: Stable CODE STATUS: DNR Diet recommendation: Heart Healthy   Brief/Interim Summary: 75 year old female with history of MGUS, neuropathy, COPD, tobacco use, anxiety who comes into the hospital with unsteady gait progressive over the last few months and then couple of falls the day prior to admission.  She also has been having worsening tremors over the last few months for which she is set to see Dr. Carles Collet with neurology in a few days.  Discharge Diagnoses:  Active Problems:   Anxiety   Neuropathy associated with benign monoclonal gammopathy   Tremor of unknown origin   CAP (community acquired pneumonia)   Vertigo   MGUS (monoclonal gammopathy of unknown significance)   Tobacco abuse   Debility   Intractable nausea and vomiting   Recurrent falls   Mass of left lung  Lung mass, underlying pulmonary fibrosis-new.  She came into the hospital with weakness, initial chest x-ray was concerning for pneumonia and was placed on antibiotics.  A CT scan of the chest showed a large mass versus infiltrate.  She does not have a fever, no WBC, cough is relatively chronic, and clinically she does not appear to have an infectious process going on so pneumonia is ruled out.  Procalcitonin is negative x2.  Discontinue antibiotics and has remained stable. -Has been smoking for 46 years, up until 2011 she would smoke a pack a day then cut that down to half a pack a day.  Pulmonology consulted, discussed with Dr. Lamonte Sakai -Status post CT-guided biopsy today, follow-up chest x-ray  without pneumothorax    Tremor, weakness, recurrent falls, vertigo-PT evaluated patient, recommending home health PT -MRI of the brain without acute findings   Anxiety-continue Xanax  MGUS-followed by oncology as an outpatient   Discharge Instructions  Discharge Instructions     Diet - low sodium heart healthy   Complete by: As directed    Discharge instructions   Complete by: As directed    Please follow up with PCP In one week.   Increase activity slowly   Complete by: As directed    No wound care   Complete by: As directed       Allergies as of 03/27/2021       Reactions   Doxycycline Swelling   Prednisone Other (See Comments)   Very emotional, crying, angry   Penicillins Rash   Has patient had a PCN reaction causing immediate rash, facial/tongue/throat swelling, SOB or lightheadedness with hypotension: yes Has patient had a PCN reaction causing severe rash involving mucus membranes or skin necrosis: no Has patient had a PCN reaction that required hospitalization: no Has patient had a PCN reaction occurring within the last 10 years: no If all of the above answers are "NO", then may proceed with Cephalosporin use.   Sulfonamide Derivatives Rash        Medication List     STOP taking these medications    amitriptyline 50 MG tablet Commonly known as: ELAVIL   DULoxetine 30 MG capsule Commonly known as: CYMBALTA   ibuprofen 200 MG tablet Commonly known as: ADVIL   primidone 50  MG tablet Commonly known as: MYSOLINE       TAKE these medications    acetaminophen 500 MG tablet Commonly known as: TYLENOL Take 500 mg by mouth daily as needed for headache (pain).   ALPRAZolam 1 MG tablet Commonly known as: XANAX Take 0.5-1 mg by mouth See admin instructions. Take one tablet (1 mg) by mouth at 5:30am, take 1/2 tablet (0.5 mg) at 7:30am, 3pm and at bedtime. What changed: Another medication with the same name was removed. Continue taking this medication,  and follow the directions you see here.   azelastine 0.05 % ophthalmic solution Commonly known as: OPTIVAR Place 1 drop into both eyes 2 (two) times daily as needed (dry eyes/itching).   lipase/protease/amylase 36000 UNITS Cpep capsule Commonly known as: CREON Take 36,000 Units by mouth See admin instructions. Take two capsules (72000 units) by mouth three times daily with meals and one capsule (36000 units) with snacks   CREON PO 2 capsules with each meal and 1 capsule with each snack   diclofenac Sodium 1 % Gel Commonly known as: VOLTAREN Apply 1 application topically at bedtime.   gabapentin 300 MG capsule Commonly known as: NEURONTIN Take 300 mg by mouth at bedtime as needed (nerve pain/burning in feet).   lamoTRIgine 25 MG tablet Commonly known as: LAMICTAL Take 50 mg by mouth every morning. What changed: Another medication with the same name was removed. Continue taking this medication, and follow the directions you see here.   multivitamin with minerals Tabs tablet Take 1 tablet by mouth every morning. Centrum   ondansetron 4 MG tablet Commonly known as: ZOFRAN Take 4 mg by mouth every 6 (six) hours as needed for nausea or vomiting.   PROBIOTIC PO Take 1 tablet by mouth every morning.   Systane Overnight Therapy 0.3 % Gel ophthalmic ointment Generic drug: hypromellose Place 1 application into both eyes at bedtime as needed for dry eyes.   valACYclovir 500 MG tablet Commonly known as: VALTREX Take 500 mg by mouth every morning.   vitamin C 1000 MG tablet Take 1,000 mg by mouth every morning.        Follow-up Information     Carolee Rota, NP Follow up in 1 week(s).   Specialty: Nurse Practitioner Contact information: [place] [address] [phone]                Allergies  Allergen Reactions   Doxycycline Swelling   Prednisone Other (See Comments)    Very emotional, crying, angry   Penicillins Rash    Has patient had a PCN  reaction causing immediate rash, facial/tongue/throat swelling, SOB or lightheadedness with hypotension: yes Has patient had a PCN reaction causing severe rash involving mucus membranes or skin necrosis: no Has patient had a PCN reaction that required hospitalization: no Has patient had a PCN reaction occurring within the last 10 years: no If all of the above answers are "NO", then may proceed with Cephalosporin use.    Sulfonamide Derivatives Rash    Consultations: PCCM    Procedures/Studies: DG Chest 2 View  Result Date: 03/23/2021 CLINICAL DATA:  Fall.  Difficulty with gait.  Chest tightness. EXAM: CHEST - 2 VIEW COMPARISON:  February 25, 2004 FINDINGS: Infiltrate identified in the left base. Reticular changes seen in the periphery of both lungs. The cardiomediastinal silhouette is normal. No other abnormalities. IMPRESSION: New left basilar infiltrate worrisome for pneumonia. Recommend short-term follow-up imaging after treatment to ensure resolution. Reticular changes in the periphery  of both lungs suggests interstitial lung disease/fibrosis. Electronically Signed   By: Dorise Bullion III M.D.   On: 03/23/2021 11:22   CT HEAD WO CONTRAST (5MM)  Result Date: 03/23/2021 CLINICAL DATA:  Head trauma. Patient fell twice today. Difficulty with gait. EXAM: CT HEAD WITHOUT CONTRAST TECHNIQUE: Contiguous axial images were obtained from the base of the skull through the vertex without intravenous contrast. COMPARISON:  None. FINDINGS: Brain: No subdural, epidural, or subarachnoid hemorrhage. Ventricles and sulci are unremarkable. No mass effect or midline shift. Cerebellum, brainstem, and basal cisterns are normal. Mild scattered white matter changes. No acute cortical ischemia or infarct. Vascular: No hyperdense vessel or unexpected calcification. Skull: Normal. Negative for fracture or focal lesion. Sinuses/Orbits: No acute finding. Other: No other abnormalities. IMPRESSION: No acute abnormalities.   Mild chronic white matter changes. Electronically Signed   By: Dorise Bullion III M.D.   On: 03/23/2021 11:21   CT Chest Wo Contrast  Result Date: 03/23/2021 CLINICAL DATA:  75 year old female with chest discomfort. EXAM: CT CHEST WITHOUT CONTRAST TECHNIQUE: Multidetector CT imaging of the chest was performed following the standard protocol without IV contrast. COMPARISON:  03/23/2021 chest radiograph and prior studies FINDINGS: Cardiovascular: Heart size is normal. Coronary artery and aortic atherosclerotic calcifications are noted. There is no evidence of thoracic aortic aneurysm or pericardial effusion. Mediastinum/Nodes: No enlarged mediastinal or axillary lymph nodes. Thyroid gland, trachea, and esophagus demonstrate no significant findings. Lungs/Pleura: A 7.5 x 7.2 cm LEFT LOWER lobe mass versus consolidation is identified. Diffuse bilateral subpleural interlobular septal thickening is noted compatible with pulmonary fibrosis/UIP. Centrilobular emphysema is noted. No pleural effusion identified. Upper Abdomen: No acute abnormality. Musculoskeletal: No acute or suspicious bony abnormalities are noted. IMPRESSION: 1. 7.5 x 7.2 cm LEFT LOWER lobe mass/masslike consolidation which may represent malignancy versus infection/pneumonia. If patient has no symptoms of infection/pneumonia, then this is worrisome for malignancy. 2. Pulmonary fibrosis/UIP. 3. Coronary artery disease. 4. Aortic Atherosclerosis (ICD10-I70.0) and Emphysema (ICD10-J43.9). Electronically Signed   By: Margarette Canada M.D.   On: 03/23/2021 15:05   CT Angio Chest Pulmonary Embolism (PE) W or WO Contrast  Result Date: 03/23/2021 CLINICAL DATA:  PE suspected, high probability EXAM: CT ANGIOGRAPHY CHEST WITH CONTRAST TECHNIQUE: Multidetector CT imaging of the chest was performed using the standard protocol during bolus administration of intravenous contrast. Multiplanar CT image reconstructions and MIPs were obtained to evaluate the vascular  anatomy. CONTRAST:  16mL OMNIPAQUE IOHEXOL 350 MG/ML SOLN COMPARISON:  Same day CT chest without contrast FINDINGS: Cardiovascular: Satisfactory opacification of the pulmonary arteries to the segmental level. No evidence of pulmonary embolism. Normal heart size. No pericardial effusion. Aortic atherosclerosis. Mediastinum/Nodes: No enlarged mediastinal, hilar, or axillary lymph nodes. Thyroid gland, trachea, and esophagus demonstrate no significant findings. Lungs/Pleura: Redemonstrated 7.5 cm left lower lobe mass versus consolidation. Diffuse bilateral subpleural interlobular septal thickening, bronchiectasis, and honeycombing, compatible with fibrosis/UIP. Centrilobular emphysema. No pleural effusion. Upper Abdomen: No acute abnormality. No evidence of reflux into the hepatic veins. Musculoskeletal: No acute osseous abnormality Review of the MIP images confirms the above findings. IMPRESSION: 1. Negative for pulmonary embolism to the level of the segmental arteries. 2. Redemonstrated 7.5 cm left lower lobe mass or consolidation, concerning for malignancy versus infection. 3. Pulmonary fibrosis/UIP. 4. Aortic Atherosclerosis (ICD10-I70.0) and Emphysema (ICD10-J43.9). Electronically Signed   By: Merilyn Baba M.D.   On: 03/23/2021 22:02   MR BRAIN W WO CONTRAST  Result Date: 03/24/2021 CLINICAL DATA:  Unsteady gait, falls EXAM: MRI HEAD WITHOUT  AND WITH CONTRAST TECHNIQUE: Multiplanar, multiecho pulse sequences of the brain and surrounding structures were obtained without and with intravenous contrast. CONTRAST:  37mL GADAVIST GADOBUTROL 1 MMOL/ML IV SOLN COMPARISON:  Noncontrast CT head 1 day prior FINDINGS: Brain: There is no evidence of acute intracranial hemorrhage, extra-axial fluid collection, or acute infarct. There is moderate global parenchymal volume loss with commensurate enlargement of the ventricular system. Extensive foci of FLAIR signal abnormality in the subcortical and periventricular white  matter likely reflects sequela of chronic white matter microangiopathy. There is no suspicious parenchymal signal abnormality. There is no mass lesion. There is no midline shift. Incidental note is made of a left cerebellar hemisphere developmental venous anomaly. There is no abnormal enhancement. Vascular: Normal flow voids. Skull and upper cervical spine: Normal marrow signal. Sinuses/Orbits: The paranasal sinuses are clear. Bilateral lens implants are in place. The globes and orbits are otherwise unremarkable. Other: None. IMPRESSION: 1. No acute intracranial hemorrhage or infarct. 2. Moderate parenchymal volume loss and chronic white matter microangiopathy. Electronically Signed   By: Valetta Mole M.D.   On: 03/24/2021 10:12   CT BIOPSY  Result Date: 03/26/2021 INDICATION: 75 year old with a left lung mass.  Tissue diagnosis is needed. EXAM: CT-GUIDED BIOPSY OF LEFT LUNG MASS MEDICATIONS: None. ANESTHESIA/SEDATION: Moderate (conscious) sedation was employed during this procedure. A total of Versed 1.0 mg and Fentanyl 50 mcg was administered intravenously. Moderate Sedation Time: 15 minutes. The patient's level of consciousness and vital signs were monitored continuously by radiology nursing throughout the procedure under my direct supervision. FLUOROSCOPY TIME:  None COMPLICATIONS: None immediate. PROCEDURE: Informed written consent was obtained from the patient after a thorough discussion of the procedural risks, benefits and alternatives. All questions were addressed. Maximal Sterile Barrier Technique was utilized including caps, mask, sterile gowns, sterile gloves, sterile drape, hand hygiene and skin antiseptic. A timeout was performed prior to the initiation of the procedure. Patient was placed on the CT scanner with the left side mildly elevated. CT images through the chest were obtained. Mass in the left lower lobe was identified. The left lower lateral chest was prepped with chlorhexidine and sterile  field was created. Skin and soft tissues were anesthetized using 1% lidocaine. Small incision was made. Using CT guidance, 17 gauge coaxial needle was directed into the left lung mass. Total of 3 core biopsies were obtained with an 18 gauge core device. 17 gauge needle was removed without complication. Bandage placed over the puncture site. FINDINGS: Again noted is a large mass and/or area of consolidation in the left lower lobe that abuts the pleural surface. Extensive fibrotic changes with honeycombing. Needle position confirmed within the lesion. No immediate pneumothorax. IMPRESSION: CT-guided core biopsy of the left lower lobe lung mass. Electronically Signed   By: Markus Daft M.D.   On: 03/26/2021 12:21   DG Chest Port 1 View  Result Date: 03/26/2021 CLINICAL DATA:  Status post lung biopsy. EXAM: PORTABLE CHEST 1 VIEW COMPARISON:  March 23, 2021. FINDINGS: No definite pneumothorax is noted status post biopsy of left lower lobe lung mass. IMPRESSION: No definite pneumothorax is noted. Electronically Signed   By: Marijo Conception M.D.   On: 03/26/2021 14:21      Subjective: No new complaints.   Discharge Exam: Vitals:   03/26/21 1400 03/26/21 2049  BP: 140/77 (!) 160/84  Pulse: 80 83  Resp: 16 20  Temp: 98.1 F (36.7 C) 97.8 F (36.6 C)  SpO2: 97% 99%   Vitals:  03/26/21 1145 03/26/21 1150 03/26/21 1400 03/26/21 2049  BP: 130/65 126/71 140/77 (!) 160/84  Pulse: 80 81 80 83  Resp: (!) 27 (!) 23 16 20   Temp:   98.1 F (36.7 C) 97.8 F (36.6 C)  TempSrc:   Oral Oral  SpO2: 100% 100% 97% 99%  Weight:      Height:        General: Pt is alert, awake, not in acute distress Cardiovascular: RRR, S1/S2 +, no rubs, no gallops Respiratory: CTA bilaterally, no wheezing, no rhonchi Abdominal: Soft, NT, ND, bowel sounds + Extremities: no edema, no cyanosis    The results of significant diagnostics from this hospitalization (including imaging, microbiology, ancillary and  laboratory) are listed below for reference.     Microbiology: Recent Results (from the past 240 hour(s))  Resp Panel by RT-PCR (Flu A&B, Covid) Nasopharyngeal Swab     Status: None   Collection Time: 03/23/21  2:40 PM   Specimen: Nasopharyngeal Swab; Nasopharyngeal(NP) swabs in vial transport medium  Result Value Ref Range Status   SARS Coronavirus 2 by RT PCR NEGATIVE NEGATIVE Final    Comment: (NOTE) SARS-CoV-2 target nucleic acids are NOT DETECTED.  The SARS-CoV-2 RNA is generally detectable in upper respiratory specimens during the acute phase of infection. The lowest concentration of SARS-CoV-2 viral copies this assay can detect is 138 copies/mL. A negative result does not preclude SARS-Cov-2 infection and should not be used as the sole basis for treatment or other patient management decisions. A negative result may occur with  improper specimen collection/handling, submission of specimen other than nasopharyngeal swab, presence of viral mutation(s) within the areas targeted by this assay, and inadequate number of viral copies(<138 copies/mL). A negative result must be combined with clinical observations, patient history, and epidemiological information. The expected result is Negative.  Fact Sheet for Patients:  EntrepreneurPulse.com.au  Fact Sheet for Healthcare Providers:  IncredibleEmployment.be  This test is no t yet approved or cleared by the [place] FDA and  has been authorized for detection and/or diagnosis of SARS-CoV-2 by FDA under an Emergency Use Authorization (EUA). This EUA will remain  in effect (meaning this test can be used) for the duration of the COVID-19 declaration under Section 564(b)(1) of the Act, 21 U.S.C.section 360bbb-3(b)(1), unless the authorization is terminated  or revoked sooner.       Influenza A by PCR NEGATIVE NEGATIVE Final   Influenza B by PCR NEGATIVE NEGATIVE Final    Comment: (NOTE) The  Xpert Xpress SARS-CoV-2/FLU/RSV plus assay is intended as an aid in the diagnosis of influenza from Nasopharyngeal swab specimens and should not be used as a sole basis for treatment. Nasal washings and aspirates are unacceptable for Xpert Xpress SARS-CoV-2/FLU/RSV testing.  Fact Sheet for Patients: EntrepreneurPulse.com.au  Fact Sheet for Healthcare Providers: IncredibleEmployment.be  This test is not yet approved or cleared by the [place] FDA and has been authorized for detection and/or diagnosis of SARS-CoV-2 by FDA under an Emergency Use Authorization (EUA). This EUA will remain in effect (meaning this test can be used) for the duration of the COVID-19 declaration under Section 564(b)(1) of the Act, 21 U.S.C. section 360bbb-3(b)(1), unless the authorization is terminated or revoked.  Performed at [facility], [address] [place], [place] [address]      Labs: BNP (last 3 results) No results for input(s): BNP in the last 8760 hours. Basic Metabolic Panel: Recent Labs  Lab 03/23/21 1111 03/23/21 2104 03/24/21 0550  NA  139  --  146*  K 3.8  --  3.5  CL 103  --  110  CO2 28  --  28  GLUCOSE 93  --  82  BUN 9  --  8  CREATININE 0.58  --  0.38*  CALCIUM 9.0  --  9.1  MG  --  1.9 2.1  PHOS  --  3.4 3.3   Liver Function Tests: Recent Labs  Lab 03/23/21 1111 03/24/21 0550  AST 17 15  ALT 10 10  ALKPHOS 84 71  BILITOT 0.6 0.4  PROT 7.1 6.5  ALBUMIN 3.3* 3.1*   No results for input(s): LIPASE, AMYLASE in the last 168 hours. No results for input(s): AMMONIA in the last 168 hours. CBC: Recent Labs  Lab 03/23/21 1111 03/24/21 0550 03/26/21 0531  WBC 9.9 8.4 10.5  NEUTROABS 6.9 4.9 7.1  HGB 12.0 11.1* 12.1  HCT 37.5 35.4* 38.1  MCV 87.0 87.6 87.2  PLT 474* 431* 429*   Cardiac Enzymes: No results for input(s): CKTOTAL, CKMB, CKMBINDEX, TROPONINI in the last 168 hours. BNP: Invalid input(s):  POCBNP CBG: Recent Labs  Lab 03/23/21 1116 03/24/21 2201  GLUCAP 90 92   D-Dimer No results for input(s): DDIMER in the last 72 hours. Hgb A1c No results for input(s): HGBA1C in the last 72 hours. Lipid Profile No results for input(s): CHOL, HDL, LDLCALC, TRIG, CHOLHDL, LDLDIRECT in the last 72 hours. Thyroid function studies No results for input(s): TSH, T4TOTAL, T3FREE, THYROIDAB in the last 72 hours.  Invalid input(s): FREET3 Anemia work up No results for input(s): VITAMINB12, FOLATE, FERRITIN, TIBC, IRON, RETICCTPCT in the last 72 hours. Urinalysis    Component Value Date/Time   COLORURINE YELLOW 03/23/2021 1331   APPEARANCEUR CLEAR 03/23/2021 1331   LABSPEC 1.015 03/23/2021 1331   PHURINE 6.5 03/23/2021 1331   GLUCOSEU NEGATIVE 03/23/2021 1331   HGBUR NEGATIVE 03/23/2021 1331   BILIRUBINUR NEGATIVE 03/23/2021 1331   BILIRUBINUR negative 04/30/2013 0947   KETONESUR NEGATIVE 03/23/2021 1331   PROTEINUR NEGATIVE 03/23/2021 1331   UROBILINOGEN 0.2 04/30/2013 0947   NITRITE NEGATIVE 03/23/2021 1331   LEUKOCYTESUR NEGATIVE 03/23/2021 1331   Sepsis Labs Invalid input(s): PROCALCITONIN,  WBC,  LACTICIDVEN Microbiology Recent Results (from the past 240 hour(s))  Resp Panel by RT-PCR (Flu A&B, Covid) Nasopharyngeal Swab     Status: None   Collection Time: 03/23/21  2:40 PM   Specimen: Nasopharyngeal Swab; Nasopharyngeal(NP) swabs in vial transport medium  Result Value Ref Range Status   SARS Coronavirus 2 by RT PCR NEGATIVE NEGATIVE Final    Comment: (NOTE) SARS-CoV-2 target nucleic acids are NOT DETECTED.  The SARS-CoV-2 RNA is generally detectable in upper respiratory specimens during the acute phase of infection. The lowest concentration of SARS-CoV-2 viral copies this assay can detect is 138 copies/mL. A negative result does not preclude SARS-Cov-2 infection and should not be used as the sole basis for treatment or other patient management decisions. A negative  result may occur with  improper specimen collection/handling, submission of specimen other than nasopharyngeal swab, presence of viral mutation(s) within the areas targeted by this assay, and inadequate number of viral copies(<138 copies/mL). A negative result must be combined with clinical observations, patient history, and epidemiological information. The expected result is Negative.  Fact Sheet for Patients:  EntrepreneurPulse.com.au  Fact Sheet for Healthcare Providers:  IncredibleEmployment.be  This test is no t yet approved or cleared by the [place] FDA and  has been authorized for detection and/or  diagnosis of SARS-CoV-2 by FDA under an Emergency Use Authorization (EUA). This EUA will remain  in effect (meaning this test can be used) for the duration of the COVID-19 declaration under Section 564(b)(1) of the Act, 21 U.S.C.section 360bbb-3(b)(1), unless the authorization is terminated  or revoked sooner.       Influenza A by PCR NEGATIVE NEGATIVE Final   Influenza B by PCR NEGATIVE NEGATIVE Final    Comment: (NOTE) The Xpert Xpress SARS-CoV-2/FLU/RSV plus assay is intended as an aid in the diagnosis of influenza from Nasopharyngeal swab specimens and should not be used as a sole basis for treatment. Nasal washings and aspirates are unacceptable for Xpert Xpress SARS-CoV-2/FLU/RSV testing.  Fact Sheet for Patients: EntrepreneurPulse.com.au  Fact Sheet for Healthcare Providers: IncredibleEmployment.be  This test is not yet approved or cleared by the [place] FDA and has been authorized for detection and/or diagnosis of SARS-CoV-2 by FDA under an Emergency Use Authorization (EUA). This EUA will remain in effect (meaning this test can be used) for the duration of the COVID-19 declaration under Section 564(b)(1) of the Act, 21 U.S.C. section 360bbb-3(b)(1), unless the authorization is  terminated or revoked.  Performed at [facility], [address], [place], [place] [address]      Time coordinating discharge: 36 minutes.   SIGNED:   Hosie Poisson, MD  [facility]

## 2021-03-27 NOTE — Plan of Care (Signed)
  Problem: Clinical Measurements: Goal: Ability to maintain clinical measurements within normal limits will improve Outcome: Adequate for Discharge Goal: Will remain free from infection Outcome: Adequate for Discharge Goal: Diagnostic test results will improve Outcome: Adequate for Discharge Goal: Respiratory complications will improve Outcome: Adequate for Discharge Goal: Cardiovascular complication will be avoided Outcome: Adequate for Discharge   Problem: Activity: Goal: Risk for activity intolerance will decrease Outcome: Adequate for Discharge   Problem: Safety: Goal: Ability to remain free from injury will improve Outcome: Adequate for Discharge   Problem: Education: Goal: Knowledge of General Education information will improve Description: Including pain rating scale, medication(s)/side effects and non-pharmacologic comfort measures Outcome: Adequate for Discharge   Problem: Health Behavior/Discharge Planning: Goal: Ability to manage health-related needs will improve Outcome: Adequate for Discharge   Problem: Clinical Measurements: Goal: Ability to maintain clinical measurements within normal limits will improve Outcome: Adequate for Discharge Goal: Will remain free from infection Outcome: Adequate for Discharge Goal: Diagnostic test results will improve Outcome: Adequate for Discharge Goal: Respiratory complications will improve Outcome: Adequate for Discharge Goal: Cardiovascular complication will be avoided Outcome: Adequate for Discharge   Problem: Activity: Goal: Risk for activity intolerance will decrease Outcome: Adequate for Discharge   Problem: Nutrition: Goal: Adequate nutrition will be maintained Outcome: Adequate for Discharge   Problem: Coping: Goal: Level of anxiety will decrease Outcome: Adequate for Discharge   Problem: Elimination: Goal: Will not experience complications related to bowel motility Outcome: Adequate for Discharge Goal:  Will not experience complications related to urinary retention Outcome: Adequate for Discharge   Problem: Pain Managment: Goal: General experience of comfort will improve Outcome: Adequate for Discharge   Problem: Safety: Goal: Ability to remain free from injury will improve Outcome: Adequate for Discharge   Problem: Skin Integrity: Goal: Risk for impaired skin integrity will decrease Outcome: Adequate for Discharge

## 2021-03-27 NOTE — Progress Notes (Signed)
Discharge orders placed.  Paperwork printed and reviewed with pt. Pt states daughter is on her way to pick her up. PIV removed.  Pt with no needs at discharge.  No other needs at this time.

## 2021-03-28 ENCOUNTER — Other Ambulatory Visit: Payer: Self-pay

## 2021-03-28 ENCOUNTER — Telehealth: Payer: Self-pay

## 2021-03-28 ENCOUNTER — Encounter: Payer: Self-pay | Admitting: Neurology

## 2021-03-28 ENCOUNTER — Ambulatory Visit: Payer: Medicare Other | Admitting: Neurology

## 2021-03-28 VITALS — BP 121/81 | HR 88 | Ht 62.0 in | Wt 114.0 lb

## 2021-03-28 DIAGNOSIS — R251 Tremor, unspecified: Secondary | ICD-10-CM

## 2021-03-28 DIAGNOSIS — R918 Other nonspecific abnormal finding of lung field: Secondary | ICD-10-CM | POA: Diagnosis not present

## 2021-03-28 DIAGNOSIS — G6289 Other specified polyneuropathies: Secondary | ICD-10-CM

## 2021-03-28 MED ORDER — AMBULATORY NON FORMULARY MEDICATION: 0 refills | Status: AC

## 2021-03-28 NOTE — Telephone Encounter (Signed)
ERROR

## 2021-03-28 NOTE — Patient Instructions (Signed)
You have a RX for a lift chair and rollator.  You can take that to your local medical supply company (such as [place]).  You have a RX for physical therapy  I would recommend walker at all times  Let us know if you decide on tremor medications  The physicians and staff at [facility] are committed to providing excellent care. You may receive a survey requesting feedback about your experience at our office. We strive to receive "very good" responses to the survey questions. If you feel that your experience would prevent you from giving the office a "very good " response, please contact our office to try to remedy the situation. We may be reached at [phone]. Thank you for taking the time out of your busy day to complete the survey.

## 2021-03-29 DIAGNOSIS — G6289 Other specified polyneuropathies: Secondary | ICD-10-CM | POA: Diagnosis not present

## 2021-03-29 LAB — SURGICAL PATHOLOGY

## 2021-04-01 ENCOUNTER — Encounter: Payer: Self-pay | Admitting: Internal Medicine

## 2021-04-01 ENCOUNTER — Telehealth: Payer: Self-pay | Admitting: *Deleted

## 2021-04-01 NOTE — Telephone Encounter (Signed)
Received vm message from pt to call her back.  TCT patient. She states she got out of the hospital on Wednesday, 03/27/21 with a new diagnosis of lung cancer. 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 Pt has known Dr. Marin Olp for along time and prefers to have him as her oncologist. She states she has not heard from that office yet.  Please advise

## 2021-04-02 ENCOUNTER — Encounter: Payer: Self-pay | Admitting: *Deleted

## 2021-04-02 ENCOUNTER — Other Ambulatory Visit: Payer: Self-pay | Admitting: Hematology & Oncology

## 2021-04-02 DIAGNOSIS — C3490 Malignant neoplasm of unspecified part of unspecified bronchus or lung: Secondary | ICD-10-CM

## 2021-04-02 NOTE — Progress Notes (Signed)
Per Dr Marin Olp, request for Foundation One testing sent on specimen [id number] DOS 03/26/2021.  Oncology Nurse Navigator Documentation  Oncology Nurse Navigator Flowsheets 04/02/2021  Abnormal Finding Date -  Confirmed Diagnosis Date -  Navigator Follow Up Date: 04/09/2021  Navigator Follow Up Reason: New Patient Appointment  Navigator Location [facility]  Referral Date to RadOnc/MedOnc -  Navigator Encounter Type Molecular Studies  Patient Visit Type MedOnc  Treatment Phase Pre-Tx/Tx Discussion  Barriers/Navigation Needs Coordination of Care;Education  Education -  Interventions Coordination of Care  Acuity Level 2-Minimal Needs (1-2 Barriers Identified)  Coordination of Care Pathology  Education Method -  Time Spent with Patient 45

## 2021-04-02 NOTE — Progress Notes (Signed)
Reached out to Delorise Jackson to introduce myself as the office RN Navigator and explain our new patient process. Reviewed the reason for their referral and scheduled their new patient appointment along with labs. Provided address and directions to the office including call back phone number. Reviewed with patient any concerns they may have or any possible barriers to attending their appointment.   Informed patient about my role as a navigator and that I will meet with them prior to their New Patient appointment and more fully discuss what services I can provide. At this time patient has no further questions or needs.   Oncology Nurse Navigator Documentation  Oncology Nurse Navigator Flowsheets 04/02/2021  Abnormal Finding Date 03/23/2021  Confirmed Diagnosis Date 03/26/2021  Navigator Follow Up Date: 04/09/2021  Navigator Follow Up Reason: New Patient Appointment  Navigator Location [facility]  Referral Date to RadOnc/MedOnc 04/02/2021  Navigator Encounter Type Introductory Phone Call  Patient Visit Type MedOnc  Treatment Phase Pre-Tx/Tx Discussion  Barriers/Navigation Needs Coordination of Care;Education  Education Other  Interventions Coordination of Care;Education  Acuity Level 2-Minimal Needs (1-2 Barriers Identified)  Coordination of Care Appts  Education Method Verbal  Time Spent with Patient 32

## 2021-04-03 ENCOUNTER — Encounter: Payer: Self-pay | Admitting: *Deleted

## 2021-04-03 DIAGNOSIS — R110 Nausea: Secondary | ICD-10-CM | POA: Diagnosis not present

## 2021-04-03 DIAGNOSIS — R918 Other nonspecific abnormal finding of lung field: Secondary | ICD-10-CM | POA: Diagnosis not present

## 2021-04-03 NOTE — Progress Notes (Signed)
Dr Marin Olp would like PET scheduled. Scheduled for 04/16/2021.  Called patient and reviewed appointment with her. She is aware of time, date and location. She knows she is NPO that morning except for water.   Oncology Nurse Navigator Documentation  Oncology Nurse Navigator Flowsheets 04/03/2021  Abnormal Finding Date -  Confirmed Diagnosis Date -  Navigator Follow Up Date: 04/09/2021  Navigator Follow Up Reason: New Patient Appointment  Navigator Location [facility]  Referral Date to RadOnc/MedOnc -  Navigator Encounter Type Appt/Treatment Plan Review  Patient Visit Type MedOnc  Treatment Phase Pre-Tx/Tx Discussion  Barriers/Navigation Needs Coordination of Care;Education  Education Other  Interventions Coordination of Care;Education  Acuity Level 2-Minimal Needs (1-2 Barriers Identified)  Coordination of Care Appts;Radiology  Education Method Verbal  Time Spent with Patient 30

## 2021-04-09 ENCOUNTER — Inpatient Hospital Stay: Payer: Medicare Other | Attending: Hematology and Oncology

## 2021-04-09 ENCOUNTER — Inpatient Hospital Stay: Payer: Medicare Other | Admitting: Hematology & Oncology

## 2021-04-09 ENCOUNTER — Encounter: Payer: Self-pay | Admitting: Hematology & Oncology

## 2021-04-09 ENCOUNTER — Encounter: Payer: Self-pay | Admitting: *Deleted

## 2021-04-09 ENCOUNTER — Other Ambulatory Visit: Payer: Self-pay

## 2021-04-09 VITALS — BP 118/79 | HR 79 | Temp 97.8°F | Resp 18 | Ht 62.0 in | Wt 107.0 lb

## 2021-04-09 DIAGNOSIS — Z5189 Encounter for other specified aftercare: Secondary | ICD-10-CM | POA: Diagnosis not present

## 2021-04-09 DIAGNOSIS — Z5112 Encounter for antineoplastic immunotherapy: Secondary | ICD-10-CM | POA: Insufficient documentation

## 2021-04-09 DIAGNOSIS — C771 Secondary and unspecified malignant neoplasm of intrathoracic lymph nodes: Secondary | ICD-10-CM | POA: Diagnosis not present

## 2021-04-09 DIAGNOSIS — R918 Other nonspecific abnormal finding of lung field: Secondary | ICD-10-CM

## 2021-04-09 DIAGNOSIS — Z5111 Encounter for antineoplastic chemotherapy: Secondary | ICD-10-CM | POA: Insufficient documentation

## 2021-04-09 DIAGNOSIS — C3432 Malignant neoplasm of lower lobe, left bronchus or lung: Secondary | ICD-10-CM | POA: Diagnosis not present

## 2021-04-09 DIAGNOSIS — Z79899 Other long term (current) drug therapy: Secondary | ICD-10-CM | POA: Diagnosis not present

## 2021-04-09 LAB — CMP (CANCER CENTER ONLY)
ALT: 7 U/L (ref 0–44)
AST: 14 U/L — ABNORMAL LOW (ref 15–41)
Albumin: 4.2 g/dL (ref 3.5–5.0)
Alkaline Phosphatase: 81 U/L (ref 38–126)
Anion gap: 9 (ref 5–15)
BUN: 9 mg/dL (ref 8–23)
CO2: 31 mmol/L (ref 22–32)
Calcium: 10.2 mg/dL (ref 8.9–10.3)
Chloride: 98 mmol/L (ref 98–111)
Creatinine: 0.68 mg/dL (ref 0.44–1.00)
GFR, Estimated: >60 mL/min (ref >60)
Glucose, Bld: 124 mg/dL — ABNORMAL HIGH (ref 70–99)
Potassium: 4.4 mmol/L (ref 3.5–5.1)
Sodium: 138 mmol/L (ref 135–145)
Total Bilirubin: 0.3 mg/dL (ref 0.3–1.2)
Total Protein: 7.4 g/dL (ref 6.5–8.1)

## 2021-04-09 LAB — CBC WITH DIFFERENTIAL (CANCER CENTER ONLY)
Abs Immature Granulocytes: 0.03 10*3/uL (ref 0.00–0.07)
Basophils Absolute: 0.1 10*3/uL (ref 0.0–0.1)
Basophils Relative: 0 %
Eosinophils Absolute: 0 10*3/uL (ref 0.0–0.5)
Eosinophils Relative: 0 %
HCT: 39.4 % (ref 36.0–46.0)
Hemoglobin: 12.4 g/dL (ref 12.0–15.0)
Immature Granulocytes: 0 %
Lymphocytes Relative: 18 %
Lymphs Abs: 2.1 10*3/uL (ref 0.7–4.0)
MCH: 27.6 pg (ref 26.0–34.0)
MCHC: 31.5 g/dL (ref 30.0–36.0)
MCV: 87.6 fL (ref 80.0–100.0)
Monocytes Absolute: 0.9 10*3/uL (ref 0.1–1.0)
Monocytes Relative: 8 %
Neutro Abs: 8.4 10*3/uL — ABNORMAL HIGH (ref 1.7–7.7)
Neutrophils Relative %: 74 %
Platelet Count: 496 10*3/uL — ABNORMAL HIGH (ref 150–400)
RBC: 4.5 MIL/uL (ref 3.87–5.11)
RDW: 13.2 % (ref 11.5–15.5)
WBC Count: 11.4 10*3/uL — ABNORMAL HIGH (ref 4.0–10.5)
nRBC: 0 % (ref 0.0–0.2)

## 2021-04-09 LAB — PREALBUMIN: Prealbumin: 16.7 mg/dL — ABNORMAL LOW (ref 18–38)

## 2021-04-09 LAB — LACTATE DEHYDROGENASE: LDH: 171 U/L (ref 98–192)

## 2021-04-09 NOTE — Progress Notes (Signed)
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  HPI: Molly Welch is very well-known to me.  She is a very nice 75 year old white female.  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  She actually worked with my wife.  She is a heavy smoker.  She has over 100-pack-year history of tobacco use.  She still was smoking.  Her history is little bit fuzzy.  She has been having some problems with balance and walking.  It is unclear as to what the etiology of this was.  She went through multiple neurological studies.  She ultimately was admitted to [facility] recently.  She was found to have "pneumonia.".  However, on a CT scan, which was done on 03/23/2021, there was a 7.5 cm left lower lobe mass.  There is no obvious adenopathy in the hilum or mediastinum.  She had no evidence of pulmonary emboli.  She did have an MRI of the brain.  This is on 03/24/2021.  This was unremarkable for any metastatic disease.   She did undergo a CT-guided biopsy of the mass.  This was done on 03/26/2021.  The pathology result ([id number]) showed squamous cell carcinoma.  She is to have a PET scan done next week.  Are molecular studies are not back yet.  She comes in with her daughter.  She has a rolling walker.  I think she is getting some physical therapy.   She is eating better.  She is having no nausea or vomiting.  She has little bit of a cough.  There is no hemoptysis.  She has had no fever.  There has been no change in bowel or bladder habits.  Overall, I would have to say that her performance status is probably ECOG 2.    Past Medical History:  Diagnosis Date   Abnormal chest x-ray 07/08/2003   COPD changes (2005) + interstitial lung dz changes (2002)   Anxiety and depression    Prozac, celexa, paxil, wellbutrin, and buspar all  failures.  Pt has stated xanax is only med that helps.   Depression    Tobacco dependence   :   Past Surgical History:  Procedure Laterality Date   ABDOMINAL HYSTERECTOMY  1979   APPENDECTOMY  1966   CATARACT EXTRACTION, BILATERAL    :   Current Outpatient Medications:    AMBULATORY NON FORMULARY MEDICATION, Rollator Diagnosis G 62.89, Disp: 1 Device, Rfl: 0   Ascorbic Acid (VITAMIN C) 1000 MG tablet, Take 1,000 mg by mouth every morning., Disp: , Rfl:    azelastine (OPTIVAR) 0.05 % ophthalmic solution, Place 1 drop into both eyes 2 (two) times daily as needed (dry eyes/itching)., Disp: , Rfl:    diclofenac Sodium (VOLTAREN) 1 % GEL, Apply 1 application topically at bedtime., Disp: , Rfl:    hypromellose (SYSTANE OVERNIGHT THERAPY) 0.3 % GEL ophthalmic ointment, Place 1 application into both eyes at bedtime as needed for dry eyes., Disp: , Rfl:    lamoTRIgine (LAMICTAL) 25 MG tablet, Take 50 mg by mouth every morning., Disp: , Rfl:    lipase/protease/amylase (CREON) 36000 UNITS CPEP capsule, Take 36,000 Units by mouth See admin instructions. Take two capsules (72000 units) by mouth three times daily with meals and one capsule (36000 units) with snacks, Disp: , Rfl:    Multiple Vitamin (  MULTIVITAMIN WITH MINERALS) TABS tablet, Take 1 tablet by mouth every morning. Centrum, Disp: , Rfl:    Probiotic Product (PROBIOTIC PO), Take 1 tablet by mouth every morning., Disp: , Rfl:    valACYclovir (VALTREX) 500 MG tablet, Take 500 mg by mouth every morning., Disp: , Rfl:    acetaminophen (TYLENOL) 500 MG tablet, Take 500 mg by mouth daily as needed for headache (pain). (Patient not taking: Reported on 04/09/2021), Disp: , Rfl:    ALPRAZolam (XANAX) 1 MG tablet, Take 0.5-1 mg by mouth See admin instructions. Take one tablet (1 mg) by mouth at 5:30am, take 1/2 tablet (0.5 mg) at 7:30am, 3pm and at bedtime. (Patient not taking: Reported on 04/09/2021), Disp: , Rfl:    AMBULATORY NON FORMULARY MEDICATION,  Lift Chair Diagnosis (Patient not taking: Reported on 04/09/2021), Disp: 1 Device, Rfl: 0   gabapentin (NEURONTIN) 300 MG capsule, Take 300 mg by mouth at bedtime as needed (nerve pain/burning in feet)., Disp: , Rfl:    ondansetron (ZOFRAN) 4 MG tablet, Take 4 mg by mouth every 6 (six) hours as needed for nausea or vomiting. (Patient not taking: Reported on 04/09/2021), Disp: , Rfl: :  :   Allergies  Allergen Reactions   Doxycycline Swelling   Prednisone Other (See Comments)    Very emotional, crying, angry   Penicillins Rash    Has patient had a PCN reaction causing immediate rash, facial/tongue/throat swelling, SOB or lightheadedness with hypotension: yes Has patient had a PCN reaction causing severe rash involving mucus membranes or skin necrosis: no Has patient had a PCN reaction that required hospitalization: no Has patient had a PCN reaction occurring within the last 10 years: no If all of the above answers are "NO", then may proceed with Cephalosporin use.    Sulfonamide Derivatives Rash  :   Family History  Problem Relation Age of Onset   Suicidality Father    Heart disease Brother    Cirrhosis Brother    Depression Son    GI Disease Son    Arthritis Son    Diabetes Other   :   Social History   Socioeconomic History   Marital status: Single    Spouse name: Not on file   Number of children: Not on file   Years of education: Not on file   Highest education level: Not on file  Occupational History   Not on file  Tobacco Use   Smoking status: Every Day    Packs/day: 0.50    Years: 55.00    Pack years: 27.50    Types: Cigarettes   Smokeless tobacco: Never  Substance and Sexual Activity   Alcohol use: No   Drug use: No   Sexual activity: Not on file  Other Topics Concern   Not on file  Social History Narrative   Right handed   Lives with daughter    Works part time PT    Social Determinants of Health   Financial Resource Strain: Not on file  Food  Insecurity: Not on file  Transportation Needs: Not on file  Physical Activity: Not on file  Stress: Not on file  Social Connections: Not on file  Intimate Partner Violence: Not on file  :  Review of Systems  Constitutional:  Positive for malaise/fatigue.  HENT: Negative.    Eyes: Negative.   Respiratory:  Positive for cough and shortness of breath.   Cardiovascular: Negative.   Gastrointestinal: Negative.   Genitourinary: Negative.   Musculoskeletal: Negative.  Skin: Negative.   Neurological:  Positive for dizziness and focal weakness.  Endo/Heme/Allergies: Negative.   Psychiatric/Behavioral: Negative.      Exam: @IPVITALS @ This is a petite white female in no obvious distress.  She is alert and oriented x3.  Her vital signs show temperature of 97.8.  Pulse 79.  Blood pressure 118/79.  Weight is 107 pounds.  Head and neck exam shows no ocular or oral lesions.  She has no palpable cervical or supraclavicular lymph nodes.  Lungs are relatively clear bilaterally.  Cardiac exam regular rate and rhythm with no murmurs, rubs or bruits.  Abdomen is soft.  She has decent bowel sounds.  There is no fluid wave.  There is no palpable liver or spleen tip.  Back exam shows no tenderness over the spine, ribs or hips.  Extremities shows no clubbing, cyanosis or edema.  She has some symmetric weakness in her arms and legs.  She has good range of motion of her joints.  Skin exam shows no rashes, ecchymoses or petechia.  Neurological exam shows no focal neurological deficits.   Recent Labs    04/09/21 1050  WBC 11.4*  HGB 12.4  HCT 39.4  PLT 496*    Recent Labs    04/09/21 1050  NA 138  K 4.4  CL 98  CO2 31  GLUCOSE 124*  BUN 9  CREATININE 0.68  CALCIUM 10.2    Blood smear review: None  Pathology: See above    Assessment and Plan: Molly Welch (Molly Welch) is a very nice 75 year old white female.  She was a longtime smoker.  She now what appears to have localized or maybe locally  advanced non-small cell lung cancer of the left lung.  This is quite large.  The PET scan will really help [place] out.  I would like to believe that she is going to have likely stage II disease and at worst stage III disease.  I really think that she is going to need some type of systemic therapy.  We really have to try to shrink down this tumor.  She does not have the best performance status.  As such, I am not sure how well she would tolerate systemic chemotherapy.  I realize that the latest clinical trials have shown that in the neoadjuvant setting, chemo/immunotherapy improves survival.  Again I am not sure if she is a great candidate for aggressive chemotherapy.  I would think though that she would be a candidate for immunotherapy.  I think that Opdivo/Yervoy might be a reasonable option for her.  Again, we really have to await the PET scan results.  Hopefully, her overall neurological issue will improve.  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  Typically, this happens with adenocarcinomas or small cell lung carcinomas.  She has strong faith.  She goes to my church.  We gave her a prayer blanket which she was very grateful for.  We will have to wait the molecular analysis.  Hopefully that will be back soon.  Once we have the PET scan results and the molecular analysis, we will get her back in and then figure out how we can treat her.

## 2021-04-09 NOTE — Progress Notes (Signed)
Initial RN Navigator Patient Visit  Name: Molly Welch Date of Referral : 04/02/2021 Diagnosis: Lung Cancer  Met with patient prior to their visit with MD. Hanley Seamen patient "Your Patient Navigator" handout which explains my role, areas in which I am able to help, and all the contact information for myself and the office. Also gave patient MD and Navigator business card. Reviewed with patient the general overview of expected course after initial diagnosis and time frame for all steps to be completed.  New patient packet given to patient which includes: orientation to office and staff; campus directory; education on My Chart and Advance Directives; and patient centered education on lung cancer.  Patient completed visit with Dr. Marin Olp. She will need her molecular markers (pending) and PET (scheduled) before making a treatment plan.  Patient understands all follow up procedures and expectations. They have my number to reach out for any further clarification or additional needs.   Oncology Nurse Navigator Documentation  Oncology Nurse Navigator Flowsheets 04/09/2021  Abnormal Finding Date -  Confirmed Diagnosis Date -  Navigator Follow Up Date: 04/16/2021  Navigator Follow Up Reason: Scan Review  Navigator Location [facility]  Referral Date to RadOnc/MedOnc -  Navigator Encounter Type Initial MedOnc  Patient Visit Type MedOnc  Treatment Phase Pre-Tx/Tx Discussion  Barriers/Navigation Needs Coordination of Care;Education  Education Newly Diagnosed Cancer Education;Pain/ Symptom Management;Other  Interventions Education;Psycho-Social Support  Acuity Level 2-Minimal Needs (1-2 Barriers Identified)  Coordination of Care -  Education Method Verbal;Written  Support Groups/Services Friends and Family  Time Spent with Patient 71

## 2021-04-11 ENCOUNTER — Encounter (HOSPITAL_COMMUNITY): Payer: Self-pay | Admitting: Hematology & Oncology

## 2021-04-15 ENCOUNTER — Encounter (HOSPITAL_COMMUNITY): Payer: Self-pay | Admitting: Hematology & Oncology

## 2021-04-15 DIAGNOSIS — C3490 Malignant neoplasm of unspecified part of unspecified bronchus or lung: Secondary | ICD-10-CM | POA: Diagnosis not present

## 2021-04-16 ENCOUNTER — Other Ambulatory Visit: Payer: Self-pay

## 2021-04-16 ENCOUNTER — Encounter (HOSPITAL_COMMUNITY)
Admission: RE | Admit: 2021-04-16 | Discharge: 2021-04-16 | Disposition: A | Discharge status: Home / Self Care | Payer: Medicare Other | Source: Ambulatory Visit | Attending: Hematology & Oncology | Admitting: Hematology & Oncology

## 2021-04-16 DIAGNOSIS — J90 Pleural effusion, not elsewhere classified: Secondary | ICD-10-CM | POA: Insufficient documentation

## 2021-04-16 DIAGNOSIS — K5730 Diverticulosis of large intestine without perforation or abscess without bleeding: Secondary | ICD-10-CM | POA: Diagnosis not present

## 2021-04-16 DIAGNOSIS — C3490 Malignant neoplasm of unspecified part of unspecified bronchus or lung: Secondary | ICD-10-CM | POA: Insufficient documentation

## 2021-04-16 DIAGNOSIS — J8410 Pulmonary fibrosis, unspecified: Secondary | ICD-10-CM | POA: Diagnosis not present

## 2021-04-16 DIAGNOSIS — Z87891 Personal history of nicotine dependence: Secondary | ICD-10-CM | POA: Diagnosis not present

## 2021-04-16 DIAGNOSIS — I2510 Atherosclerotic heart disease of native coronary artery without angina pectoris: Secondary | ICD-10-CM | POA: Insufficient documentation

## 2021-04-16 DIAGNOSIS — I700 Atherosclerosis of aorta: Secondary | ICD-10-CM | POA: Diagnosis not present

## 2021-04-16 DIAGNOSIS — C3432 Malignant neoplasm of lower lobe, left bronchus or lung: Secondary | ICD-10-CM | POA: Diagnosis not present

## 2021-04-16 LAB — GLUCOSE, CAPILLARY: Glucose-Capillary: 103 mg/dL — ABNORMAL HIGH (ref 70–99)

## 2021-04-16 MED ORDER — FLUDEOXYGLUCOSE F - 18 (FDG) INJECTION
5.2000 | Freq: Once | INTRAVENOUS | Status: AC | PRN
  Administered 2021-04-16: 5.3 via INTRAVENOUS

## 2021-04-17 ENCOUNTER — Encounter: Payer: Self-pay | Admitting: *Deleted

## 2021-04-17 NOTE — Progress Notes (Signed)
PET scan results and Foundation One testing have resulted. Message sent to MD to determine follow up.   Oncology Nurse Navigator Documentation  Oncology Nurse Navigator Flowsheets 04/17/2021  Abnormal Finding Date -  Confirmed Diagnosis Date -  Navigator Follow Up Date: 04/18/2021  Navigator Follow Up Reason: Appointment Review  Navigator Location [facility]  Referral Date to RadOnc/MedOnc -  Navigator Encounter Type Scan Review  Patient Visit Type MedOnc  Treatment Phase Pre-Tx/Tx Discussion  Barriers/Navigation Needs Coordination of Care;Education  Education -  Interventions None Required  Acuity Level 2-Minimal Needs (1-2 Barriers Identified)  Coordination of Care -  Education Method -  Support Groups/Services Friends and Family  Time Spent with Patient 15

## 2021-04-18 ENCOUNTER — Other Ambulatory Visit: Payer: Self-pay | Admitting: *Deleted

## 2021-04-18 ENCOUNTER — Encounter: Payer: Self-pay | Admitting: *Deleted

## 2021-04-18 DIAGNOSIS — D472 Monoclonal gammopathy: Secondary | ICD-10-CM

## 2021-04-18 DIAGNOSIS — C3490 Malignant neoplasm of unspecified part of unspecified bronchus or lung: Secondary | ICD-10-CM

## 2021-04-18 DIAGNOSIS — R918 Other nonspecific abnormal finding of lung field: Secondary | ICD-10-CM

## 2021-04-18 NOTE — Progress Notes (Signed)
Reviewed PET and path with Dr Marin Olp. He would like to follow up with patient tomorrow to review workup and discuss treatment.   Called patient. Her daughter, [name] called back and wanted to coordinate appointments. Time, date and location shared and was acceptable.   Oncology Nurse Navigator Documentation  Oncology Nurse Navigator Flowsheets 04/18/2021  Abnormal Finding Date -  Confirmed Diagnosis Date -  Navigator Follow Up Date: 04/19/2021  Navigator Follow Up Reason: Follow-up Appointment  Navigator Location [facility]  Referral Date to RadOnc/MedOnc -  Navigator Encounter Type Appt/Treatment Plan Review  Patient Visit Type MedOnc  Treatment Phase Pre-Tx/Tx Discussion  Barriers/Navigation Needs Coordination of Care;Education  Education Other  Interventions Coordination of Care;Education  Acuity Level 2-Minimal Needs (1-2 Barriers Identified)  Coordination of Care Appts  Education Method Verbal  Support Groups/Services Friends and Family  Time Spent with Patient 30

## 2021-04-19 ENCOUNTER — Inpatient Hospital Stay: Payer: Medicare Other | Admitting: Hematology & Oncology

## 2021-04-19 ENCOUNTER — Other Ambulatory Visit: Payer: Self-pay

## 2021-04-19 ENCOUNTER — Encounter: Payer: Self-pay | Admitting: Hematology & Oncology

## 2021-04-19 ENCOUNTER — Inpatient Hospital Stay: Payer: Medicare Other

## 2021-04-19 VITALS — BP 113/73 | HR 72 | Temp 98.3°F | Resp 18 | Wt 107.0 lb

## 2021-04-19 DIAGNOSIS — R918 Other nonspecific abnormal finding of lung field: Secondary | ICD-10-CM

## 2021-04-19 DIAGNOSIS — C3490 Malignant neoplasm of unspecified part of unspecified bronchus or lung: Secondary | ICD-10-CM

## 2021-04-19 DIAGNOSIS — C3432 Malignant neoplasm of lower lobe, left bronchus or lung: Secondary | ICD-10-CM

## 2021-04-19 DIAGNOSIS — C771 Secondary and unspecified malignant neoplasm of intrathoracic lymph nodes: Secondary | ICD-10-CM | POA: Diagnosis not present

## 2021-04-19 DIAGNOSIS — Z7189 Other specified counseling: Secondary | ICD-10-CM | POA: Diagnosis not present

## 2021-04-19 DIAGNOSIS — Z5189 Encounter for other specified aftercare: Secondary | ICD-10-CM | POA: Diagnosis not present

## 2021-04-19 DIAGNOSIS — C3430 Malignant neoplasm of lower lobe, unspecified bronchus or lung: Secondary | ICD-10-CM

## 2021-04-19 DIAGNOSIS — Z79899 Other long term (current) drug therapy: Secondary | ICD-10-CM | POA: Diagnosis not present

## 2021-04-19 DIAGNOSIS — D472 Monoclonal gammopathy: Secondary | ICD-10-CM

## 2021-04-19 DIAGNOSIS — Z5112 Encounter for antineoplastic immunotherapy: Secondary | ICD-10-CM | POA: Diagnosis not present

## 2021-04-19 DIAGNOSIS — Z5111 Encounter for antineoplastic chemotherapy: Secondary | ICD-10-CM | POA: Diagnosis not present

## 2021-04-19 HISTORY — DX: Other specified counseling: Z71.89

## 2021-04-19 HISTORY — DX: Malignant neoplasm of lower lobe, unspecified bronchus or lung: C34.30

## 2021-04-19 LAB — LACTATE DEHYDROGENASE: LDH: 177 U/L (ref 98–192)

## 2021-04-19 LAB — CMP (CANCER CENTER ONLY)
ALT: 10 U/L (ref 0–44)
AST: 16 U/L (ref 15–41)
Albumin: 4.1 g/dL (ref 3.5–5.0)
Alkaline Phosphatase: 83 U/L (ref 38–126)
Anion gap: 8 (ref 5–15)
BUN: 11 mg/dL (ref 8–23)
CO2: 33 mmol/L — ABNORMAL HIGH (ref 22–32)
Calcium: 10.8 mg/dL — ABNORMAL HIGH (ref 8.9–10.3)
Chloride: 97 mmol/L — ABNORMAL LOW (ref 98–111)
Creatinine: 0.67 mg/dL (ref 0.44–1.00)
GFR, Estimated: >60 mL/min (ref >60)
Glucose, Bld: 77 mg/dL (ref 70–99)
Potassium: 4.2 mmol/L (ref 3.5–5.1)
Sodium: 138 mmol/L (ref 135–145)
Total Bilirubin: 0.3 mg/dL (ref 0.3–1.2)
Total Protein: 7.1 g/dL (ref 6.5–8.1)

## 2021-04-19 LAB — CBC WITH DIFFERENTIAL (CANCER CENTER ONLY)
Abs Immature Granulocytes: 0.04 10*3/uL (ref 0.00–0.07)
Basophils Absolute: 0.1 10*3/uL (ref 0.0–0.1)
Basophils Relative: 1 %
Eosinophils Absolute: 0.1 10*3/uL (ref 0.0–0.5)
Eosinophils Relative: 1 %
HCT: 37.2 % (ref 36.0–46.0)
Hemoglobin: 11.7 g/dL — ABNORMAL LOW (ref 12.0–15.0)
Immature Granulocytes: 0 %
Lymphocytes Relative: 27 %
Lymphs Abs: 2.9 10*3/uL (ref 0.7–4.0)
MCH: 27.6 pg (ref 26.0–34.0)
MCHC: 31.5 g/dL (ref 30.0–36.0)
MCV: 87.7 fL (ref 80.0–100.0)
Monocytes Absolute: 1 10*3/uL (ref 0.1–1.0)
Monocytes Relative: 10 %
Neutro Abs: 6.6 10*3/uL (ref 1.7–7.7)
Neutrophils Relative %: 61 %
Platelet Count: 393 10*3/uL (ref 150–400)
RBC: 4.24 MIL/uL (ref 3.87–5.11)
RDW: 13.2 % (ref 11.5–15.5)
WBC Count: 10.7 10*3/uL — ABNORMAL HIGH (ref 4.0–10.5)
nRBC: 0 % (ref 0.0–0.2)

## 2021-04-19 NOTE — Progress Notes (Signed)
START OFF PATHWAY REGIMEN - Non-Small Cell Lung   [id number]:Carboplatin IV D1,22 + Paclitaxel IV D1,22 + Ipilimumab IV D1 + Nivolumab IV D1,22 q42 Days x 1 Cycle Followed by Ipilimumab IV D1 + Nivolumab IV D1,22 q42 Days:   Cycle 1: A cycle is 42 days:     Nivolumab      Ipilimumab      Carboplatin      Paclitaxel    Cycles 2 and beyond: A cycle is every 42 days:     Nivolumab      Ipilimumab   **Always confirm dose/schedule in your pharmacy ordering system**  Patient Characteristics: Preoperative or Nonsurgical Candidate (Clinical Staging), Stage III - Nonsurgical Candidate (Nonsquamous and Squamous), PS = 0, 1 Therapeutic Status: Preoperative or Nonsurgical Candidate (Clinical Staging) AJCC T Category: cT4 AJCC N Category: cN2 AJCC M Category: cM0 AJCC 8 Stage Grouping: IIIB ECOG Performance Status: 1 Intent of Therapy: Curative Intent, Discussed with Patient

## 2021-04-19 NOTE — Progress Notes (Signed)
Hematology and Oncology Follow Up Visit  Molly Welch [id number] 01-02-1946 75 y.o. 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  We now have more information that we need.  We did a PET scan on her.  This was done on 04/16/2021.  This showed a 7.3 cm left lower lobe mass with an SUV of 15.6.  There is some faint activity in a mediastinal lymph node and left infrahilar lymph node.  Otherwise, there is no disease elsewhere.  Based on the PET scan, I would suspect that she probably has stage IIIB disease.  As such, I think we have a potential to cure this.  However, this was going to be difficult as she is not in the best of shape.  I do not think that she would be a good candidate for combined modality therapy.  I think we should probably consider upfront systemic chemotherapy and then see how she responds and see what kind of performance status she has.  She is improving.  Her appetite is improving.  Her weight I think is about the same.  She has little bit of nausea.  I told her that she can take the Zofran on schedule III times a day.  She has had no bleeding.  There is no cough.  She has little bit of heaviness over on the left chest wall.  She has had no issues with headache.  She still has the neurological issues.  I do not know if this might be paraneoplastic from the tumor.  She is going to the bathroom.  There is no diarrhea.  Overall, I would have to say her performance status is probably ECOG 1-2.  Medications:  Current Outpatient Medications:    acetaminophen (TYLENOL) 500 MG tablet, Take 500 mg by mouth daily as needed for headache (pain). (Patient not taking: Reported on 04/09/2021), Disp: , Rfl:    ALPRAZolam (XANAX) 1 MG tablet,  Take 0.5-1 mg by mouth See admin instructions. Take one tablet (1 mg) by mouth at 5:30am, take 1/2 tablet (0.5 mg) at 7:30am, 3pm and at bedtime. (Patient not taking: Reported on 04/09/2021), Disp: , Rfl:    AMBULATORY NON FORMULARY MEDICATION, Lift Chair Diagnosis (Patient not taking: Reported on 04/09/2021), Disp: 1 Device, Rfl: 0   AMBULATORY NON FORMULARY MEDICATION, Rollator Diagnosis G 62.89, Disp: 1 Device, Rfl: 0   Ascorbic Acid (VITAMIN C) 1000 MG tablet, Take 1,000 mg by mouth every morning., Disp: , Rfl:    azelastine (OPTIVAR) 0.05 % ophthalmic solution, Place 1 drop into both eyes 2 (two) times daily as needed (dry eyes/itching)., Disp: , Rfl:    diclofenac Sodium (VOLTAREN) 1 % GEL, Apply 1 application topically at bedtime., Disp: , Rfl:    gabapentin (NEURONTIN) 300 MG capsule, Take 300 mg by mouth at bedtime as needed (nerve pain/burning in feet)., Disp: , Rfl:    hypromellose (SYSTANE OVERNIGHT THERAPY) 0.3 % GEL ophthalmic ointment, Place 1 application into both eyes at bedtime as needed for dry eyes., Disp: , Rfl:    lamoTRIgine (LAMICTAL) 25 MG tablet, Take 50 mg by mouth every morning., Disp: , Rfl:    lipase/protease/amylase (CREON) 36000 UNITS CPEP capsule, Take 36,000 Units by mouth See admin instructions. Take two capsules (72000 units) by  mouth three times daily with meals and one capsule (36000 units) with snacks, Disp: , Rfl:    Multiple Vitamin (MULTIVITAMIN WITH MINERALS) TABS tablet, Take 1 tablet by mouth every morning. Centrum, Disp: , Rfl:    ondansetron (ZOFRAN) 4 MG tablet, Take 4 mg by mouth every 6 (six) hours as needed for nausea or vomiting. (Patient not taking: Reported on 04/09/2021), Disp: , Rfl:    Probiotic Product (PROBIOTIC PO), Take 1 tablet by mouth every morning., Disp: , Rfl:    valACYclovir (VALTREX) 500 MG tablet, Take 500 mg by mouth every morning., Disp: , Rfl:   Allergies:  Allergies  Allergen Reactions   Doxycycline Swelling   Prednisone Other  (See Comments)    Very emotional, crying, angry   Penicillins Rash    Has patient had a PCN reaction causing immediate rash, facial/tongue/throat swelling, SOB or lightheadedness with hypotension: yes Has patient had a PCN reaction causing severe rash involving mucus membranes or skin necrosis: no Has patient had a PCN reaction that required hospitalization: no Has patient had a PCN reaction occurring within the last 10 years: no If all of the above answers are "NO", then may proceed with Cephalosporin use.    Sulfonamide Derivatives Rash    Past Medical History, Surgical history, Social history, and Family History were reviewed and updated.  Review of Systems: Review of Systems  Constitutional:  Positive for fatigue.  HENT:  Negative.    Eyes: Negative.   Respiratory: Negative.    Cardiovascular:  Positive for chest pain.  Gastrointestinal:  Positive for nausea.  Endocrine: Negative.   Genitourinary: Negative.    Skin: Negative.   Neurological:  Positive for extremity weakness.  Hematological: Negative.   Psychiatric/Behavioral: Negative.     Physical Exam:  weight is 107 lb (48.5 kg). Her oral temperature is 98.3 F (36.8 C). Her blood pressure is 113/73 and her pulse is 72. Her respiration is 18 and oxygen saturation is 100%.   Wt Readings from Last 3 Encounters:  04/19/21 107 lb (48.5 kg)  04/09/21 107 lb (48.5 kg)  03/28/21 114 lb (51.7 kg)    Physical Exam Vitals reviewed.  HENT:     Head: Normocephalic and atraumatic.  Eyes:     Pupils: Pupils are equal, round, and reactive to light.  Cardiovascular:     Rate and Rhythm: Normal rate and regular rhythm.     Heart sounds: Normal heart sounds.  Pulmonary:     Effort: Pulmonary effort is normal.     Breath sounds: Normal breath sounds.  Abdominal:     General: Bowel sounds are normal.     Palpations: Abdomen is soft.  Musculoskeletal:        General: No tenderness or deformity. Normal range of motion.      Cervical back: Normal range of motion.  Lymphadenopathy:     Cervical: No cervical adenopathy.  Skin:    General: Skin is warm and dry.     Findings: No erythema or rash.  Neurological:     Mental Status: She is alert and oriented to person, place, and time.  Psychiatric:        Behavior: Behavior normal.        Thought Content: Thought content normal.        Judgment: Judgment normal.     Lab Results  Component Value Date   WBC 10.7 (H) 04/19/2021   HGB 11.7 (L) 04/19/2021   HCT 37.2 04/19/2021   MCV 87.7  04/19/2021   PLT 393 04/19/2021     Chemistry      Component Value Date/Time   NA 138 04/19/2021 1523   NA 139 11/24/2019 1527   K 4.2 04/19/2021 1523   CL 97 (L) 04/19/2021 1523   CO2 33 (H) 04/19/2021 1523   BUN 11 04/19/2021 1523   BUN 13 11/24/2019 1527   CREATININE 0.67 04/19/2021 1523      Component Value Date/Time   CALCIUM 10.8 (H) 04/19/2021 1523   ALKPHOS 83 04/19/2021 1523   AST 16 04/19/2021 1523   ALT 10 04/19/2021 1523   BILITOT 0.3 04/19/2021 1523      Impression and Plan: Ms. Lamboy is a very charming 75 year old white female.  I have known her for many years.  She actually got me my wife together..  She now has a locally advanced squamous cell carcinoma of the left lung.  By the staging studies that we have, looks like this is a stage IIIB tumor.  Again, I think we should try for induction chemotherapy.  I would probably use chemoimmunotherapy on her.  I would have to make some dosage adjustments.  Hopefully, by doing this, we do not have to use as much chemotherapy.  I probably would use the NCCN approved regimen of nivolumab/ipilimumab/paclitaxel/carboplatinum.  Again, she will need dosage reduction.  I explained the regimen to Ms. Jaskiewicz and her daughter.  I gave him information sheets about the regimen.  I explained that she will lose her hair.  I explained that she might have a decrease in appetite.  Hopefully, she will not lose more  weight.  I do think that she would tolerate this protocol.  She will need Neulasta afterwards.  She will need a Port-A-Cath to be placed.  We will get this done.  I will try to get the Port-A-Cath in for 04/25/2021.  I would like to get started with cycle 1 on 04/26/2021.  After the second cycle of treatment, I would then do another PET scan and see how everything looks and see how she is responded.  I am not taken surgery off the table as of yet.  I realize this is the best and probably only way to be able to cure her.  However, she just is not a good candidate right now.  I would like to see her back when she does her second cycle of treatment in November.   Volanda Napoleon, MD 10/14/20225:26 PM

## 2021-04-22 ENCOUNTER — Other Ambulatory Visit: Payer: Self-pay | Admitting: Pharmacist

## 2021-04-22 DIAGNOSIS — M6281 Muscle weakness (generalized): Secondary | ICD-10-CM | POA: Diagnosis not present

## 2021-04-22 DIAGNOSIS — R2681 Unsteadiness on feet: Secondary | ICD-10-CM | POA: Diagnosis not present

## 2021-04-22 DIAGNOSIS — G6289 Other specified polyneuropathies: Secondary | ICD-10-CM | POA: Diagnosis not present

## 2021-04-22 DIAGNOSIS — R262 Difficulty in walking, not elsewhere classified: Secondary | ICD-10-CM | POA: Diagnosis not present

## 2021-04-23 ENCOUNTER — Encounter: Payer: Self-pay | Admitting: *Deleted

## 2021-04-23 ENCOUNTER — Other Ambulatory Visit: Payer: Self-pay | Admitting: *Deleted

## 2021-04-23 DIAGNOSIS — C3432 Malignant neoplasm of lower lobe, left bronchus or lung: Secondary | ICD-10-CM

## 2021-04-23 MED ORDER — ONDANSETRON HCL 8 MG PO TABS: 8.0000 mg | ORAL_TABLET | Freq: Two times a day (BID) | ORAL | 1 refills | Status: DC | PRN

## 2021-04-23 MED ORDER — PROCHLORPERAZINE MALEATE 10 MG PO TABS: 10.0000 mg | ORAL_TABLET | Freq: Four times a day (QID) | ORAL | 1 refills | Status: DC | PRN

## 2021-04-23 MED ORDER — DEXAMETHASONE 4 MG PO TABS: 8.0000 mg | ORAL_TABLET | Freq: Every day | ORAL | 1 refills | Status: DC

## 2021-04-23 MED ORDER — LIDOCAINE-PRILOCAINE 2.5-2.5 % EX CREA
TOPICAL_CREAM | CUTANEOUS | 3 refills | Status: DC
Start: 2021-04-23 — End: 2021-07-29

## 2021-04-23 NOTE — Progress Notes (Signed)
Patient was seen on Friday and plan for chemo was made. She was scheduled for the following:  Chemo ED 04/24/2021 Port Placement 04/25/2021 Treatment Start 04/26/2021  There was a note that VM was left notifying patient of appointment, so I called the patient's daughter, Molly Welch to confirm that they were aware of all appointments.  Molly Welch was aware, but did ask that chemo ED be pushed back to 3pm. This was accommodated. Educated [place] with basic overview of treatment and expectations. She knows the chemo education will go much more in depth and allow for questions. No other needs at this time.   Oncology Nurse Navigator Documentation  Oncology Nurse Navigator Flowsheets 04/23/2021  Abnormal Finding Date -  Confirmed Diagnosis Date -  Navigator Follow Up Date: 04/26/2021  Navigator Follow Up Reason: Chemotherapy  Navigator Location [facility]  Referral Date to RadOnc/MedOnc -  Navigator Encounter Type Appt/Treatment Plan Review;Telephone  Telephone Appt Confirmation/Clarification;Outgoing Call  Patient Visit Type MedOnc  Treatment Phase Pre-Tx/Tx Discussion  Barriers/Navigation Needs Coordination of Care;Education  Education Understanding Cancer/ Treatment Options  Interventions Coordination of Care;Education  Acuity Level 2-Minimal Needs (1-2 Barriers Identified)  Coordination of Care Appts  Education Method Verbal  Support Groups/Services Friends and Family  Time Spent with Patient 52

## 2021-04-24 ENCOUNTER — Other Ambulatory Visit (HOSPITAL_COMMUNITY): Payer: Self-pay | Admitting: Physician Assistant

## 2021-04-24 ENCOUNTER — Inpatient Hospital Stay: Payer: Medicare Other

## 2021-04-24 ENCOUNTER — Encounter: Payer: Self-pay | Admitting: *Deleted

## 2021-04-24 ENCOUNTER — Other Ambulatory Visit: Payer: Self-pay

## 2021-04-24 NOTE — Progress Notes (Unsigned)
Patient in chemotherapy education class with  daughter [name].  Discussed side effects of  Carboplatin, Taxol, Yervoy, Opdivo which include but are not limited to myelosuppression, decreased appetite, fatigue, fever, allergic or infusional reaction, mucositis, cardiac toxicity, cough, SOB, altered taste, nausea and vomiting, diarrhea, constipation, elevated LFTs myalgia and arthralgias, hair loss or thinning, rash, skin dryness, nail changes, peripheral neuropathy, discolored urine, delayed wound healing, mental changes (Chemo brain), increased risk of infections, weight loss.  Reviewed infusion room and office policy and procedure and phone numbers 24 hours x 7 days a week.  Reviewed ambulatory pump specifics and how to manage safe handling at home.  Reviewed when to call the office with any concerns or problems.  Scientist, clinical (histocompatibility and immunogenetics) given.  Discussed portacath insertion and EMLA cream administration.  Antiemetic protocol and chemotherapy schedule reviewed. Patient verbalized understanding of chemotherapy indications and possible side effects.  Teachback done

## 2021-04-25 ENCOUNTER — Encounter (HOSPITAL_COMMUNITY): Payer: Self-pay

## 2021-04-25 ENCOUNTER — Ambulatory Visit (HOSPITAL_COMMUNITY)
Admission: RE | Admit: 2021-04-25 | Discharge: 2021-04-25 | Disposition: A | Discharge status: Home / Self Care | Payer: Medicare Other | Source: Ambulatory Visit | Attending: Hematology & Oncology | Admitting: Hematology & Oncology

## 2021-04-25 ENCOUNTER — Other Ambulatory Visit: Payer: Self-pay | Admitting: Hematology & Oncology

## 2021-04-25 DIAGNOSIS — Z882 Allergy status to sulfonamides status: Secondary | ICD-10-CM | POA: Diagnosis not present

## 2021-04-25 DIAGNOSIS — C3432 Malignant neoplasm of lower lobe, left bronchus or lung: Secondary | ICD-10-CM | POA: Diagnosis not present

## 2021-04-25 DIAGNOSIS — F17210 Nicotine dependence, cigarettes, uncomplicated: Secondary | ICD-10-CM | POA: Diagnosis not present

## 2021-04-25 DIAGNOSIS — Z888 Allergy status to other drugs, medicaments and biological substances status: Secondary | ICD-10-CM | POA: Insufficient documentation

## 2021-04-25 DIAGNOSIS — C3490 Malignant neoplasm of unspecified part of unspecified bronchus or lung: Secondary | ICD-10-CM | POA: Diagnosis not present

## 2021-04-25 DIAGNOSIS — R918 Other nonspecific abnormal finding of lung field: Secondary | ICD-10-CM

## 2021-04-25 DIAGNOSIS — Z881 Allergy status to other antibiotic agents status: Secondary | ICD-10-CM | POA: Insufficient documentation

## 2021-04-25 DIAGNOSIS — Z880 Allergy status to penicillin: Secondary | ICD-10-CM | POA: Insufficient documentation

## 2021-04-25 DIAGNOSIS — Z452 Encounter for adjustment and management of vascular access device: Secondary | ICD-10-CM | POA: Diagnosis not present

## 2021-04-25 HISTORY — PX: IR IMAGING GUIDED PORT INSERTION: IMG5740

## 2021-04-25 MED ORDER — LIDOCAINE-EPINEPHRINE 1 %-1:100000 IJ SOLN
INTRAMUSCULAR | Status: AC
  Filled 2021-04-25: qty 1

## 2021-04-25 MED ORDER — FENTANYL CITRATE (PF) 100 MCG/2ML IJ SOLN
INTRAMUSCULAR | Status: AC
  Filled 2021-04-25: qty 2

## 2021-04-25 MED ORDER — MIDAZOLAM HCL 2 MG/2ML IJ SOLN
INTRAMUSCULAR | Status: AC
  Filled 2021-04-25: qty 2

## 2021-04-25 MED ORDER — HEPARIN SOD (PORK) LOCK FLUSH 100 UNIT/ML IV SOLN
INTRAVENOUS | Status: AC | PRN
  Administered 2021-04-25: 500 [IU] via INTRAVENOUS

## 2021-04-25 MED ORDER — MIDAZOLAM HCL 2 MG/2ML IJ SOLN
INTRAMUSCULAR | Status: AC | PRN
  Administered 2021-04-25 (×2): 1 mg via INTRAVENOUS

## 2021-04-25 MED ORDER — SODIUM CHLORIDE 0.9 % IV SOLN: INTRAVENOUS | Status: DC

## 2021-04-25 MED ORDER — HEPARIN SOD (PORK) LOCK FLUSH 100 UNIT/ML IV SOLN
INTRAVENOUS | Status: AC
  Filled 2021-04-25: qty 5

## 2021-04-25 MED ORDER — FENTANYL CITRATE (PF) 100 MCG/2ML IJ SOLN
INTRAMUSCULAR | Status: AC | PRN
  Administered 2021-04-25 (×2): 50 ug via INTRAVENOUS

## 2021-04-25 NOTE — H&P (Signed)
Chief Complaint: Lung Cancer  Referring Physician(s): Ennever  Supervising Physician: Mir, Sharen Heck  Patient Status: [facility] - Out-pt  History of Present Illness: Molly Welch is a 75 y.o. female with new diagnosis of stage 3B non-small cell lung cancer.  She is known to our service. She underwent CT guided biopsy on 03/26/21 by Dr. Anselm Pancoast.  She is here today for placement of a tunneled catheter with port for chemotherapy.  She is NPO. No nausea/vomiting. No Fever/chills. ROS negative.   Past Medical History:  Diagnosis Date   Abnormal chest x-ray 07/08/2003   COPD changes (2005) + interstitial lung dz changes (2002)   Anxiety and depression    Prozac, celexa, paxil, wellbutrin, and buspar all failures.  Pt has stated xanax is only med that helps.   Depression    Goals of care, counseling/discussion 04/19/2021   Lung cancer, lower lobe ([place]) 04/19/2021   Tobacco dependence     Past Surgical History:  Procedure Laterality Date   ABDOMINAL HYSTERECTOMY  1979   APPENDECTOMY  1966   CATARACT EXTRACTION, BILATERAL      Allergies: Doxycycline, Prednisone, Penicillins, and Sulfonamide derivatives  Medications: Prior to Admission medications   Medication Sig Start Date End Date Taking? Authorizing Provider  acetaminophen (TYLENOL) 500 MG tablet Take 500 mg by mouth daily as needed for headache (pain).   Yes [provider]  ALPRAZolam Duanne Moron) 1 MG tablet Take 0.5-1 mg by mouth See admin instructions. Take one tablet (1 mg) by mouth at 5:30am, take 1/2 tablet (0.5 mg) at 7:30am, 3pm and at bedtime. 03/04/21  Yes [provider]  Ascorbic Acid (VITAMIN C) 1000 MG tablet Take 1,000 mg by mouth every morning.   Yes [provider]  azelastine (OPTIVAR) 0.05 % ophthalmic solution Place 1 drop into both eyes 2 (two) times daily as needed (dry eyes/itching).   Yes [provider]  diclofenac Sodium (VOLTAREN) 1 % GEL Apply 1 application  topically at bedtime.   Yes [provider]  hypromellose (SYSTANE OVERNIGHT THERAPY) 0.3 % GEL ophthalmic ointment Place 1 application into both eyes at bedtime as needed for dry eyes.   Yes [provider]  lamoTRIgine (LAMICTAL) 25 MG tablet Take 50 mg by mouth every morning. 05/01/16  Yes [provider]  lipase/protease/amylase (CREON) 36000 UNITS CPEP capsule Take 36,000 Units by mouth See admin instructions. Take two capsules (72000 units) by mouth three times daily with meals and one capsule (36000 units) with snacks   Yes [provider]  Multiple Vitamin (MULTIVITAMIN WITH MINERALS) TABS tablet Take 1 tablet by mouth every morning. Centrum   Yes [provider]  ondansetron (ZOFRAN) 8 MG tablet Take 1 tablet (8 mg total) by mouth 2 (two) times daily as needed for refractory nausea / vomiting. Start on day 3 after carboplatin chemo. 04/23/21  Yes Volanda Napoleon, MD  Probiotic Product (PROBIOTIC PO) Take 1 tablet by mouth every morning.   Yes [provider]  valACYclovir (VALTREX) 500 MG tablet Take 500 mg by mouth every morning.   Yes [provider]  AMBULATORY NON FORMULARY MEDICATION Lift Chair Diagnosis Patient not taking: Reported on 04/09/2021 03/28/21   Tat, Eustace Quail, DO  AMBULATORY NON FORMULARY MEDICATION Rollator Diagnosis G 62.89 03/28/21   Tat, Rebecca S, DO  dexamethasone (DECADRON) 4 MG tablet Take 2 tablets (8 mg total) by mouth daily. Start the day after carboplatin chemotherapy for 3 days. 04/23/21   Burney Gauze  R, MD  gabapentin (NEURONTIN) 300 MG capsule Take 300 mg by mouth at bedtime as needed (nerve pain/burning in feet). 01/26/21   [provider]  lidocaine-prilocaine (EMLA) cream Apply to affected area once 04/23/21   Ennever, Rudell Cobb, MD  ondansetron (ZOFRAN) 4 MG tablet Take 4 mg by mouth every 6 (six) hours as needed for nausea or vomiting. Patient not taking: Reported on 04/09/2021 03/14/21    [provider]  prochlorperazine (COMPAZINE) 10 MG tablet Take 1 tablet (10 mg total) by mouth every 6 (six) hours as needed (Nausea or vomiting). 04/23/21   Volanda Napoleon, MD     Family History  Problem Relation Age of Onset   Suicidality Father    Heart disease Brother    Cirrhosis Brother    Depression Son    GI Disease Son    Arthritis Son    Diabetes Other     Social History   Socioeconomic History   Marital status: Single    Spouse name: Not on file   Number of children: Not on file   Years of education: Not on file   Highest education level: Not on file  Occupational History   Not on file  Tobacco Use   Smoking status: Every Day    Packs/day: 0.50    Years: 55.00    Pack years: 27.50    Types: Cigarettes   Smokeless tobacco: Never  Substance and Sexual Activity   Alcohol use: No   Drug use: No   Sexual activity: Not on file  Other Topics Concern   Not on file  Social History Narrative   Right handed   Lives with daughter    Works part time PT    Social Determinants of Health   Financial Resource Strain: Not on file  Food Insecurity: Not on file  Transportation Needs: Not on file  Physical Activity: Not on file  Stress: Not on file  Social Connections: Not on file     Review of Systems: A 12 point ROS discussed and pertinent positives are indicated in the HPI above.  All other systems are negative.  Review of Systems  Vital Signs: Wt Readings from Last 3 Encounters:  04/19/21 48.5 kg  04/09/21 48.5 kg  03/28/21 51.7 kg   Temp Readings from Last 3 Encounters:  04/25/21 98 F (36.7 C)  04/19/21 98.3 F (36.8 C) (Oral)  04/09/21 97.8 F (36.6 C) (Oral)   BP Readings from Last 3 Encounters:  04/25/21 106/76  04/19/21 113/73  04/09/21 118/79   Pulse Readings from Last 3 Encounters:  04/25/21 73  04/19/21 72  04/09/21 79     Physical Exam Vitals reviewed.  Constitutional:      Appearance: Normal appearance.  HENT:      Head: Normocephalic and atraumatic.  Eyes:     Extraocular Movements: Extraocular movements intact.  Cardiovascular:     Rate and Rhythm: Normal rate and regular rhythm.  Pulmonary:     Effort: Pulmonary effort is normal. No respiratory distress.     Breath sounds: Normal breath sounds.  Abdominal:     Palpations: Abdomen is soft.  Musculoskeletal:        General: Normal range of motion.     Cervical back: Normal range of motion.  Skin:    General: Skin is warm and dry.  Neurological:     General: No focal deficit present.     Mental Status: She is alert and oriented to person,  place, and time.  Psychiatric:        Mood and Affect: Mood normal.        Behavior: Behavior normal.        Thought Content: Thought content normal.        Judgment: Judgment normal.    Imaging: NM PET Image Initial (PI) Skull Base To Thigh  Result Date: 04/17/2021 CLINICAL DATA:  Initial treatment strategy for left lower lobe non-small cell lung cancer. EXAM: NUCLEAR MEDICINE PET SKULL BASE TO THIGH TECHNIQUE: 5.3 mCi F-18 FDG was injected intravenously. Full-ring PET imaging was performed from the skull base to thigh after the radiotracer. CT data was obtained and used for attenuation correction and anatomic localization. Fasting blood glucose: 103 mg/dl COMPARISON:  CT chest 03/23/2021 FINDINGS: Mediastinal blood pool activity: SUV max 1.7 Liver activity: SUV max NA NECK: Bilateral activity along the arytenoid region of the posterior glottis, likely physiologic. Mild focal accentuated activity along the upper margin of the left parotid gland, maximum SUV 4.1, potential small lymph node or nodule in this vicinity measuring about 0.5 cm in diameter. Incidental CT findings: Bilateral common carotid atherosclerotic calcifications. CHEST: Centrally necrotic left lower lobe mass has a maximum SUV of 15.6, with hypermetabolic activity measuring about 7.3 by 5.6 cm. Anterior AP window lymph node measuring 0.9 cm in  short axis on image 63 series 4, maximum SUV 3.0. Faint left infrahilar activity with maximum SUV 2.8. Activity along the right posterior deltoid is thought to be physiologic/incidental. The trace left pleural effusion is indeterminate for malignancy. Incidental CT findings: Biapical pleuroparenchymal scarring. Peripheral fibrosis in the lungs. Coronary, aortic arch, and branch vessel atherosclerotic vascular disease. ABDOMEN/PELVIS: Indistinct fullness of both adrenal glands with density measurements favoring adenomas on the prior CT chest from 03/23/2021. Maximum SUV of the left adrenal gland 2.5 and maximum SUV on the right 2.6, not currently considered specific for metastatic disease to the adrenal glands. Incidental CT findings: Atherosclerosis is present, including aortoiliac atherosclerotic disease. Photopenic left kidney upper pole cyst laterally. Prominent stool throughout the colon favors constipation. Sigmoid colon diverticulosis. SKELETON: No significant abnormal hypermetabolic activity in this region. Incidental CT findings: Degenerative arthropathy of both hips, right greater than left. IMPRESSION: 1. The 7.3 cm centrally necrotic left lower lobe mass has a maximum SUV of 15.6, compatible with malignancy. Faintly accentuated metabolic activity in a borderline prominent AP window lymph node and faint left infrahilar accentuated activity may reflect nodal involvement. 2. Trace left pleural effusion is indeterminate for malignancy. 3. Small bilateral low-density adrenal masses favor adenomas, and do not demonstrate hypermetabolic activity. 4. Small focus of activity in the left parotid gland superiorly with maximum SUV 4.1. 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 5. Other imaging findings of potential clinical significance: Substantial atherosclerosis including coronary atherosclerosis. Aortic Atherosclerosis (ICD10-I70.0). Prominent stool throughout the colon  favors constipation. Sigmoid colon diverticulosis. Peripheral fibrosis in the lungs. Electronically Signed   By: Van Clines M.D.   On: 04/17/2021 15:24    Labs:  CBC: Recent Labs    03/24/21 0550 03/26/21 0531 04/09/21 1050 04/19/21 1523  WBC 8.4 10.5 11.4* 10.7*  HGB 11.1* 12.1 12.4 11.7*  HCT 35.4* 38.1 39.4 37.2  PLT 431* 429* 496* 393    COAGS: Recent Labs    03/26/21 0531  INR 1.0    BMP: Recent Labs    03/23/21 1111 03/24/21 0550 04/09/21 1050 04/19/21 1523  NA 139 146* 138  138  K 3.8 3.5 4.4 4.2  CL 103 110 98 97*  CO2 28 28 31  33*  GLUCOSE 93 82 124* 77  BUN 9 8 9 11   CALCIUM 9.0 9.1 10.2 10.8*  CREATININE 0.58 0.38* 0.68 0.67  GFRNONAA >60 >60 >60 >60    LIVER FUNCTION TESTS: Recent Labs    03/23/21 1111 03/24/21 0550 04/09/21 1050 04/19/21 1523  BILITOT 0.6 0.4 0.3 0.3  AST 17 15 14* 16  ALT 10 10 7 10   ALKPHOS 84 71 81 83  PROT 7.1 6.5 7.4 7.1  ALBUMIN 3.3* 3.1* 4.2 4.1    TUMOR MARKERS: No results for input(s): AFPTM, CEA, CA199, CHROMGRNA in the last 8760 hours.  Assessment and Plan:  Stage 3B non-small cell lung cancer.  Will proceed with placement of a tunneled catheter with port today by Dr. Dwaine Gale.  Risks and benefits of image guided port-a-catheter placement was discussed with the patient including, but not limited to bleeding, infection, pneumothorax, or fibrin sheath development and need for additional procedures.  All of the patient's questions were answered, patient is agreeable to proceed. Consent signed and in chart.  Thank you for allowing our service to participate in Molly Welch 's care.  Electronically Signed: Murrell Redden, PA-C   04/25/2021, 3:05 PM      I spent a total of 15 minutes  in face to face in clinical consultation, greater than 50% of which was counseling/coordinating care for port a cath placement.

## 2021-04-25 NOTE — Procedures (Signed)
Interventional Radiology Procedure Note  Procedure: Port placement.  Indication: Lung ca  Findings: Please refer to procedural dictation for full description.  Complications: None  EBL: < 10 mL  Miachel Roux, MD [phone]

## 2021-04-25 NOTE — Discharge Instructions (Signed)
Interventional radiology phone numbers °[phone] °After hours [phone] ° ° ° °You have skin glue (dermabond) over your new port. Do not use the lidocaine cream (EMLA cream) over the skin glue until it has healed. The petroleum in the lidocaine cream will dissolve the skin glue resulting in an infection of your new port. Use ice in a zip lock bag for 1-2 minutes over your new port before the cancer center nurses access your port. ° ° °Implanted Port Insertion, Care After °This sheet gives you information about how to care for yourself after your procedure. Your health care provider may also give you more specific instructions. If you have problems or questions, contact your health care provider. °What can I expect after the procedure? °After the procedure, it is common to have: °Discomfort at the port insertion site. °Bruising on the skin over the port. This should improve over 3-4 days. °Follow these instructions at home: °Port care °After your port is placed, you will get a manufacturer's information card. The card has information about your port. Keep this card with you at all times. °Take care of the port as told by your health care provider. Ask your health care provider if you or a family member can get training for taking care of the port at home. A home health care nurse may also take care of the port. °Make sure to remember what type of port you have. °Incision care °Follow instructions from your health care provider about how to take care of your port insertion site. Make sure you: °Wash your hands with soap and water before and after you change your bandage (dressing). If soap and water are not available, use hand sanitizer. °Change your dressing as told by your health care provider. °Leave skin glue in place. These skin closures may need to stay in place for 2 weeks or longer.  °Check your port insertion site every day for signs of infection. Check for: °Redness, swelling, or pain. °Fluid or  blood. °Warmth. °Pus or a bad smell.  °  °  °Activity °Return to your normal activities as told by your health care provider. Ask your health care provider what activities are safe for you. °Do not lift anything that is heavier than 10 lb (4.5 kg), or the limit that you are told, until your health care provider says that it is safe. °General instructions °Take over-the-counter and prescription medicines only as told by your health care provider. °Do not take baths, swim, or use a hot tub until your health care provider approves.You may remove your dressing tomorrow and shower 24 hours after your procedure. °Do not drive for 24 hours if you were given a sedative during your procedure. °Wear a medical alert bracelet in case of an emergency. This will tell any health care providers that you have a port. °Keep all follow-up visits as told by your health care provider. This is important. °Contact a health care provider if: °You cannot flush your port with saline as directed, or you cannot draw blood from the port. °You have a fever or chills. °You have redness, swelling, or pain around your port insertion site. °You have fluid or blood coming from your port insertion site. °Your port insertion site feels warm to the touch. °You have pus or a bad smell coming from the port insertion site. °Get help right away if: °You have chest pain or shortness of breath. °You have bleeding from your port that you cannot control. °Summary °Take care of   the port as told by your health care provider. Keep the manufacturer's information card with you at all times. °Change your dressing as told by your health care provider. °Contact a health care provider if you have a fever or chills or if you have redness, swelling, or pain around your port insertion site. °Keep all follow-up visits as told by your health care provider. °This information is not intended to replace advice given to you by your health care provider. Make sure you discuss any  questions you have with your health care provider. °Document Revised: 01/19/2018 Document Reviewed: 01/19/2018 °Elsevier Patient Education © 2021 Elsevier Inc. ° ° ° °Moderate Conscious Sedation, Adult, Care After °This sheet gives you information about how to care for yourself after your procedure. Your health care provider may also give you more specific instructions. If you have problems or questions, contact your health care provider. °What can I expect after the procedure? °After the procedure, it is common to have: °Sleepiness for several hours. °Impaired judgment for several hours. °Difficulty with balance. °Vomiting if you eat too soon. °Follow these instructions at home: °For the time period you were told by your health care provider: °Rest. °Do not participate in activities where you could fall or become injured. °Do not drive or use machinery. °Do not drink alcohol. °Do not take sleeping pills or medicines that cause drowsiness. °Do not make important decisions or sign legal documents. °Do not take care of children on your own.  °  °  °Eating and drinking °Follow the diet recommended by your health care provider. °Drink enough fluid to keep your urine pale yellow. °If you vomit: °Drink water, juice, or soup when you can drink without vomiting. °Make sure you have little or no nausea before eating solid foods.   °General instructions °Take over-the-counter and prescription medicines only as told by your health care provider. °Have a responsible adult stay with you for the time you are told. It is important to have someone help care for you until you are awake and alert. °Do not smoke. °Keep all follow-up visits as told by your health care provider. This is important. °Contact a health care provider if: °You are still sleepy or having trouble with balance after 24 hours. °You feel light-headed. °You keep feeling nauseous or you keep vomiting. °You develop a rash. °You have a fever. °You have redness or  swelling around the IV site. °Get help right away if: °You have trouble breathing. °You have new-onset confusion at home. °Summary °After the procedure, it is common to feel sleepy, have impaired judgment, or feel nauseous if you eat too soon. °Rest after you get home. Know the things you should not do after the procedure. °Follow the diet recommended by your health care provider and drink enough fluid to keep your urine pale yellow. °Get help right away if you have trouble breathing or new-onset confusion at home. °This information is not intended to replace advice given to you by your health care provider. Make sure you discuss any questions you have with your health care provider. °Document Revised: 10/21/2019 Document Reviewed: 05/19/2019 °Elsevier Patient Education © 2021 Elsevier Inc.  °

## 2021-04-26 ENCOUNTER — Inpatient Hospital Stay: Payer: Medicare Other

## 2021-04-26 ENCOUNTER — Encounter: Payer: Self-pay | Admitting: *Deleted

## 2021-04-26 ENCOUNTER — Other Ambulatory Visit: Payer: Self-pay

## 2021-04-26 VITALS — BP 137/74 | HR 74 | Temp 98.0°F | Resp 17

## 2021-04-26 DIAGNOSIS — Z5189 Encounter for other specified aftercare: Secondary | ICD-10-CM | POA: Diagnosis not present

## 2021-04-26 DIAGNOSIS — C3432 Malignant neoplasm of lower lobe, left bronchus or lung: Secondary | ICD-10-CM | POA: Diagnosis not present

## 2021-04-26 DIAGNOSIS — Z5112 Encounter for antineoplastic immunotherapy: Secondary | ICD-10-CM | POA: Diagnosis not present

## 2021-04-26 DIAGNOSIS — Z79899 Other long term (current) drug therapy: Secondary | ICD-10-CM | POA: Diagnosis not present

## 2021-04-26 DIAGNOSIS — F419 Anxiety disorder, unspecified: Secondary | ICD-10-CM

## 2021-04-26 DIAGNOSIS — C771 Secondary and unspecified malignant neoplasm of intrathoracic lymph nodes: Secondary | ICD-10-CM | POA: Diagnosis not present

## 2021-04-26 DIAGNOSIS — Z5111 Encounter for antineoplastic chemotherapy: Secondary | ICD-10-CM | POA: Diagnosis not present

## 2021-04-26 LAB — CBC WITH DIFFERENTIAL (CANCER CENTER ONLY)
Abs Immature Granulocytes: 0.03 10*3/uL (ref 0.00–0.07)
Basophils Absolute: 0.1 10*3/uL (ref 0.0–0.1)
Basophils Relative: 1 %
Eosinophils Absolute: 0.1 10*3/uL (ref 0.0–0.5)
Eosinophils Relative: 1 %
HCT: 36.2 % (ref 36.0–46.0)
Hemoglobin: 11.5 g/dL — ABNORMAL LOW (ref 12.0–15.0)
Immature Granulocytes: 0 %
Lymphocytes Relative: 20 %
Lymphs Abs: 2.1 10*3/uL (ref 0.7–4.0)
MCH: 27.6 pg (ref 26.0–34.0)
MCHC: 31.8 g/dL (ref 30.0–36.0)
MCV: 86.8 fL (ref 80.0–100.0)
Monocytes Absolute: 1.1 10*3/uL — ABNORMAL HIGH (ref 0.1–1.0)
Monocytes Relative: 10 %
Neutro Abs: 7.2 10*3/uL (ref 1.7–7.7)
Neutrophils Relative %: 68 %
Platelet Count: 395 10*3/uL (ref 150–400)
RBC: 4.17 MIL/uL (ref 3.87–5.11)
RDW: 13.3 % (ref 11.5–15.5)
WBC Count: 10.5 10*3/uL (ref 4.0–10.5)
nRBC: 0 % (ref 0.0–0.2)

## 2021-04-26 LAB — CMP (CANCER CENTER ONLY)
ALT: 11 U/L (ref 0–44)
AST: 17 U/L (ref 15–41)
Albumin: 3.9 g/dL (ref 3.5–5.0)
Alkaline Phosphatase: 82 U/L (ref 38–126)
Anion gap: 7 (ref 5–15)
BUN: 11 mg/dL (ref 8–23)
CO2: 32 mmol/L (ref 22–32)
Calcium: 10.6 mg/dL — ABNORMAL HIGH (ref 8.9–10.3)
Chloride: 97 mmol/L — ABNORMAL LOW (ref 98–111)
Creatinine: 0.55 mg/dL (ref 0.44–1.00)
GFR, Estimated: >60 mL/min (ref >60)
Glucose, Bld: 91 mg/dL (ref 70–99)
Potassium: 4 mmol/L (ref 3.5–5.1)
Sodium: 136 mmol/L (ref 135–145)
Total Bilirubin: 0.4 mg/dL (ref 0.3–1.2)
Total Protein: 7.3 g/dL (ref 6.5–8.1)

## 2021-04-26 LAB — TSH: TSH: 1.429 u[IU]/mL (ref 0.350–4.500)

## 2021-04-26 MED ORDER — FAMOTIDINE 20 MG IN NS 100 ML IVPB
20.0000 mg | Freq: Two times a day (BID) | INTRAVENOUS | Status: DC
  Administered 2021-04-26: 20 mg via INTRAVENOUS
  Filled 2021-04-26: qty 100

## 2021-04-26 MED ORDER — SODIUM CHLORIDE 0.9 % IV SOLN
1.0000 mg/kg | Freq: Once | INTRAVENOUS | Status: AC
  Administered 2021-04-26: 50 mg via INTRAVENOUS
  Filled 2021-04-26: qty 10

## 2021-04-26 MED ORDER — SODIUM CHLORIDE 0.9 % IV SOLN
150.0000 mg/m2 | Freq: Once | INTRAVENOUS | Status: AC
  Administered 2021-04-26: 222 mg via INTRAVENOUS
  Filled 2021-04-26: qty 37

## 2021-04-26 MED ORDER — SODIUM CHLORIDE 0.9% FLUSH
10.0000 mL | INTRAVENOUS | Status: DC | PRN
  Administered 2021-04-26: 10 mL

## 2021-04-26 MED ORDER — FAMOTIDINE IN NACL 20-0.9 MG/50ML-% IV SOLN: 20.0000 mg | Freq: Once | INTRAVENOUS | Status: DC

## 2021-04-26 MED ORDER — SODIUM CHLORIDE 0.9 % IV SOLN
360.0000 mg | Freq: Once | INTRAVENOUS | Status: AC
  Administered 2021-04-26: 360 mg via INTRAVENOUS
  Filled 2021-04-26: qty 24

## 2021-04-26 MED ORDER — SODIUM CHLORIDE 0.9 % IV SOLN
300.0000 mg | Freq: Once | INTRAVENOUS | Status: AC
  Administered 2021-04-26: 300 mg via INTRAVENOUS
  Filled 2021-04-26: qty 30

## 2021-04-26 MED ORDER — LORAZEPAM 1 MG PO TABS
0.5000 mg | ORAL_TABLET | Freq: Once | ORAL | Status: AC
  Administered 2021-04-26: 0.5 mg via ORAL
  Filled 2021-04-26: qty 1

## 2021-04-26 MED ORDER — LORAZEPAM 1 MG PO TABS
0.5000 mg | ORAL_TABLET | Freq: Once | ORAL | Status: AC
  Administered 2021-04-26: 0.5 mg via ORAL
  Filled 2021-04-26 (×2): qty 1

## 2021-04-26 MED ORDER — DIPHENHYDRAMINE HCL 50 MG/ML IJ SOLN: 25.0000 mg | Freq: Once | INTRAMUSCULAR | Status: DC

## 2021-04-26 MED ORDER — SODIUM CHLORIDE 0.9 % IV SOLN
10.0000 mg | Freq: Once | INTRAVENOUS | Status: AC
  Administered 2021-04-26: 10 mg via INTRAVENOUS
  Filled 2021-04-26: qty 10

## 2021-04-26 MED ORDER — SODIUM CHLORIDE 0.9 % IV SOLN: Freq: Once | INTRAVENOUS | Status: AC

## 2021-04-26 MED ORDER — SODIUM CHLORIDE 0.9 % IV SOLN
150.0000 mg | Freq: Once | INTRAVENOUS | Status: AC
  Administered 2021-04-26: 150 mg via INTRAVENOUS
  Filled 2021-04-26: qty 150

## 2021-04-26 MED ORDER — HEPARIN SOD (PORK) LOCK FLUSH 100 UNIT/ML IV SOLN
500.0000 [IU] | Freq: Once | INTRAVENOUS | Status: AC | PRN
  Administered 2021-04-26: 500 [IU]

## 2021-04-26 MED ORDER — PALONOSETRON HCL INJECTION 0.25 MG/5ML
0.2500 mg | Freq: Once | INTRAVENOUS | Status: AC
  Administered 2021-04-26: 0.25 mg via INTRAVENOUS
  Filled 2021-04-26: qty 5

## 2021-04-26 MED ORDER — DIPHENHYDRAMINE HCL 50 MG/ML IJ SOLN
50.0000 mg | Freq: Once | INTRAMUSCULAR | Status: AC
Start: 2021-04-26 — End: 2021-04-26
  Administered 2021-04-26: 50 mg via INTRAVENOUS
  Filled 2021-04-26: qty 1

## 2021-04-26 NOTE — Progress Notes (Signed)
Patient requested something for anxiety while she was here, forgot to take her xanax this morning at home. Verbal order received from MD for ativan 0.5mg  tablet.

## 2021-04-26 NOTE — Patient Instructions (Signed)
[facility]  Discharge Instructions: Thank you for choosing [facility] to provide your oncology and hematology care.   If you have a lab appointment with the [facility], please go directly to the [facility] and check in at the registration area.  Wear comfortable clothing and clothing appropriate for easy access to any Portacath or PICC line.   We strive to give you quality time with your provider. You may need to reschedule your appointment if you arrive late (15 or more minutes).  Arriving late affects you and other patients whose appointments are after yours.  Also, if you miss three or more appointments without notifying the office, you may be dismissed from the clinic at the provider's discretion.      For prescription refill requests, have your pharmacy contact our office and allow 72 hours for refills to be completed.    Today you received the following chemotherapy and/or immunotherapy agents Carboplatin, Taxol, Yervoy, Opdivo    To help prevent nausea and vomiting after your treatment, we encourage you to take your nausea medication as directed.  BELOW ARE SYMPTOMS THAT SHOULD BE REPORTED IMMEDIATELY: *FEVER GREATER THAN 100.4 F (38 C) OR HIGHER *CHILLS OR SWEATING *NAUSEA AND VOMITING THAT IS NOT CONTROLLED WITH YOUR NAUSEA MEDICATION *UNUSUAL SHORTNESS OF BREATH *UNUSUAL BRUISING OR BLEEDING *URINARY PROBLEMS (pain or burning when urinating, or frequent urination) *BOWEL PROBLEMS (unusual diarrhea, constipation, pain near the anus) TENDERNESS IN MOUTH AND THROAT WITH OR WITHOUT PRESENCE OF ULCERS (sore throat, sores in mouth, or a toothache) UNUSUAL RASH, SWELLING OR PAIN  UNUSUAL VAGINAL DISCHARGE OR ITCHING   Items with * indicate a potential emergency and should be followed up as soon as possible or go to the Emergency Department if any problems should occur.  Please show the CHEMOTHERAPY ALERT CARD or IMMUNOTHERAPY ALERT  CARD at check-in to the Emergency Department and triage nurse. Should you have questions after your visit or need to cancel or reschedule your appointment, please contact [facility]  [phone] and follow the prompts.  Office hours are 8:00 a.m. to 4:30 p.m. Monday - Friday. Please note that voicemails left after 4:00 p.m. may not be returned until the following business day.  We are closed weekends and major holidays. You have access to a nurse at all times for urgent questions. Please call the main number to the clinic [phone] and follow the prompts.  For any non-urgent questions, you may also contact your provider using MyChart. We now offer e-Visits for anyone 6 and older to request care online for non-urgent symptoms. For details visit [email or web address].   Also download the MyChart app! Go to the app store, search "MyChart", open the app, select [facility], and log in with your MyChart username and password.  Due to Covid, a mask is required upon entering the hospital/clinic. If you do not have a mask, one will be given to you upon arrival. For doctor visits, patients may have 1 support person aged 39 or older with them. For treatment visits, patients cannot have anyone with them due to current Covid guidelines and our immunocompromised population.

## 2021-04-26 NOTE — Progress Notes (Signed)

## 2021-04-26 NOTE — Progress Notes (Signed)
Patient is here to start treatment today. She had her port placed yesterday and didn't sleep well last night. There is some tenderness at the site, but otherwise unremarkable. She is looking forward to laying back and napping once the premeds are given.   Chemo education completed on 04/24/21. She has no further questions at this time, just anxious to get started. She knows to call the office number with any questions or concerns. I will follow up with her next week after her first treatment to see how she is feeling.  Oncology Nurse Navigator Documentation  Oncology Nurse Navigator Flowsheets 04/26/2021  Abnormal Finding Date -  Confirmed Diagnosis Date -  Planned Course of Treatment Chemotherapy  Phase of Treatment Chemo  Chemotherapy Actual Start Date: 04/26/2021  Navigator Follow Up Date: 04/29/2021  Navigator Follow Up Reason: Patient Call  Navigator Location [facility]  Referral Date to RadOnc/MedOnc -  Navigator Encounter Type Treatment  Telephone -  Patient Visit Type MedOnc  Treatment Phase First Chemo Tx  Barriers/Navigation Needs Coordination of Care;Education  Education Other  Interventions Education;Psycho-Social Support  Acuity Level 2-Minimal Needs (1-2 Barriers Identified)  Coordination of Care -  Education Method Verbal  Support Groups/Services Friends and Family  Time Spent with Patient 30

## 2021-04-26 NOTE — Patient Instructions (Signed)
Implanted Port Home Guide An implanted port is a device that is placed under the skin. It is usually placed in the chest. The device can be used to give IV medicine, to take blood, or for dialysis. You may have an implanted port if: You need IV medicine that would be irritating to the small veins in your hands or arms. You need IV medicines, such as antibiotics, for a long period of time. You need IV nutrition for a long period of time. You need dialysis. When you have a port, your health care provider can choose to use the port instead of veins in your arms for these procedures. You may have fewer limitations when using a port than you would if you used other types of long-term IVs, and you will likely be able to return to normal activities after your incision heals. An implanted port has two main parts: Reservoir. The reservoir is the part where a needle is inserted to give medicines or draw blood. The reservoir is round. After it is placed, it appears as a small, raised area under your skin. Catheter. The catheter is a thin, flexible tube that connects the reservoir to a vein. Medicine that is inserted into the reservoir goes into the catheter and then into the vein. How is my port accessed? To access your port: A numbing cream may be placed on the skin over the port site. Your health care provider will put on a mask and sterile gloves. The skin over your port will be cleaned carefully with a germ-killing soap and allowed to dry. Your health care provider will gently pinch the port and insert a needle into it. Your health care provider will check for a blood return to make sure the port is in the vein and is not clogged. If your port needs to remain accessed to get medicine continuously (constant infusion), your health care provider will place a clear bandage (dressing) over the needle site. The dressing and needle will need to be changed every week, or as told by your health care provider. What  is flushing? Flushing helps keep the port from getting clogged. Follow instructions from your health care provider about how and when to flush the port. Ports are usually flushed with saline solution or a medicine called heparin. The need for flushing will depend on how the port is used: If the port is only used from time to time to give medicines or draw blood, the port may need to be flushed: Before and after medicines have been given. Before and after blood has been drawn. As part of routine maintenance. Flushing may be recommended every 4-6 weeks. If a constant infusion is running, the port may not need to be flushed. Throw away any syringes in a disposal container that is meant for sharp items (sharps container). You can buy a sharps container from a pharmacy, or you can make one by using an empty hard plastic bottle with a cover. How long will my port stay implanted? The port can stay in for as long as your health care provider thinks it is needed. When it is time for the port to come out, a surgery will be done to remove it. The surgery will be similar to the procedure that was done to put the port in. Follow these instructions at home:  Flush your port as told by your health care provider. If you need an infusion over several days, follow instructions from your health care provider about how   to take care of your port site. Make sure you: Wash your hands with soap and water before you change your dressing. If soap and water are not available, use alcohol-based hand sanitizer. Change your dressing as told by your health care provider. Place any used dressings or infusion bags into a plastic bag. Throw that bag in the trash. Keep the dressing that covers the needle clean and dry. Do not get it wet. Do not use scissors or sharp objects near the tube. Keep the tube clamped, unless it is being used. Check your port site every day for signs of infection. Check for: Redness, swelling, or  pain. Fluid or blood. Pus or a bad smell. Protect the skin around the port site. Avoid wearing bra straps that rub or irritate the site. Protect the skin around your port from seat belts. Place a soft pad over your chest if needed. Bathe or shower as told by your health care provider. The site may get wet as long as you are not actively receiving an infusion. Return to your normal activities as told by your health care provider. Ask your health care provider what activities are safe for you. Carry a medical alert card or wear a medical alert bracelet at all times. This will let health care providers know that you have an implanted port in case of an emergency. Get help right away if: You have redness, swelling, or pain at the port site. You have fluid or blood coming from your port site. You have pus or a bad smell coming from the port site. You have a fever. Summary Implanted ports are usually placed in the chest for long-term IV access. Follow instructions from your health care provider about flushing the port and changing bandages (dressings). Take care of the area around your port by avoiding clothing that puts pressure on the area, and by watching for signs of infection. Protect the skin around your port from seat belts. Place a soft pad over your chest if needed. Get help right away if you have a fever or you have redness, swelling, pain, drainage, or a bad smell at the port site. This information is not intended to replace advice given to you by your health care provider. Make sure you discuss any questions you have with your health care provider. Document Revised: 09/12/2020 Document Reviewed: 11/07/2019 Elsevier Patient Education  2022 Elsevier Inc.  

## 2021-04-27 LAB — T4: T4, Total: 7.2 ug/dL (ref 4.5–12.0)

## 2021-04-29 ENCOUNTER — Inpatient Hospital Stay: Payer: Medicare Other

## 2021-04-29 ENCOUNTER — Encounter: Payer: Self-pay | Admitting: Hematology & Oncology

## 2021-04-29 ENCOUNTER — Other Ambulatory Visit: Payer: Self-pay

## 2021-04-29 VITALS — BP 127/68 | HR 86 | Temp 97.9°F | Resp 18

## 2021-04-29 DIAGNOSIS — Z5189 Encounter for other specified aftercare: Secondary | ICD-10-CM | POA: Diagnosis not present

## 2021-04-29 DIAGNOSIS — Z79899 Other long term (current) drug therapy: Secondary | ICD-10-CM | POA: Diagnosis not present

## 2021-04-29 DIAGNOSIS — C3432 Malignant neoplasm of lower lobe, left bronchus or lung: Secondary | ICD-10-CM | POA: Diagnosis not present

## 2021-04-29 DIAGNOSIS — Z5111 Encounter for antineoplastic chemotherapy: Secondary | ICD-10-CM | POA: Diagnosis not present

## 2021-04-29 DIAGNOSIS — C771 Secondary and unspecified malignant neoplasm of intrathoracic lymph nodes: Secondary | ICD-10-CM | POA: Diagnosis not present

## 2021-04-29 DIAGNOSIS — Z5112 Encounter for antineoplastic immunotherapy: Secondary | ICD-10-CM | POA: Diagnosis not present

## 2021-04-29 MED ORDER — PEGFILGRASTIM-BMEZ 6 MG/0.6ML ~~LOC~~ SOSY
6.0000 mg | PREFILLED_SYRINGE | Freq: Once | SUBCUTANEOUS | Status: AC
  Administered 2021-04-29: 6 mg via SUBCUTANEOUS
  Filled 2021-04-29: qty 0.6

## 2021-04-29 NOTE — Patient Instructions (Signed)

## 2021-04-30 ENCOUNTER — Encounter: Payer: Self-pay | Admitting: Hematology & Oncology

## 2021-04-30 ENCOUNTER — Other Ambulatory Visit: Payer: Self-pay

## 2021-04-30 ENCOUNTER — Encounter: Payer: Self-pay | Admitting: *Deleted

## 2021-04-30 MED ORDER — MAGIC MOUTHWASH W/LIDOCAINE: 5.0000 mL | Freq: Four times a day (QID) | ORAL | 0 refills | Status: DC | PRN

## 2021-04-30 NOTE — Progress Notes (Signed)
Patient sent messages via MyChart with c/o sore, scratchy throat and a mouth sore. Called patient and she states she has some congestion that she can't clear at the back of her throat and one mouth sore she described as a "bubble". She is using biotene, sugar free lozenges and Claritin.  Spoke with Judson Roch and we will send in [facility] prescription and encourage fluid intake, and guaifenesin to help clear secretions.   Also asked how she was doing after her first chemo treatment on Friday. She is doing well overall. She's had some nausea but this has been well controlled with compazine. Her injection yesterday has caused some bone pain which made her cancel PT. She also states some sensitivity to the steroids. Her daughter has been helping her and is very supportive.   She has no other questions at this time.   Oncology Nurse Navigator Documentation  Oncology Nurse Navigator Flowsheets 04/30/2021  Abnormal Finding Date -  Confirmed Diagnosis Date -  Planned Course of Treatment -  Phase of Treatment -  Chemotherapy Actual Start Date: -  Navigator Follow Up Date: 05/17/2021  Navigator Follow Up Reason: Follow-up Appointment  Navigator Location [facility]  Referral Date to RadOnc/MedOnc -  Navigator Encounter Type Telephone  Telephone Symptom Mgt;Patient Update;Outgoing Call;Medication Assistance  Treatment Initiated Date 04/26/2021  Patient Visit Type MedOnc  Treatment Phase Active Tx  Barriers/Navigation Needs Coordination of Care;Education  Education Pain/ Symptom Management  Interventions Education;Medication Assistance;Psycho-Social Support  Acuity Level 2-Minimal Needs (1-2 Barriers Identified)  Coordination of Care -  Education Method Verbal;Teach-back  Support Groups/Services Friends and Family  Time Spent with Patient 30

## 2021-04-30 NOTE — Telephone Encounter (Signed)
Faxed magic mouth wash rx to pharmacy

## 2021-05-07 DIAGNOSIS — R2681 Unsteadiness on feet: Secondary | ICD-10-CM | POA: Diagnosis not present

## 2021-05-07 DIAGNOSIS — G6289 Other specified polyneuropathies: Secondary | ICD-10-CM | POA: Diagnosis not present

## 2021-05-07 DIAGNOSIS — R262 Difficulty in walking, not elsewhere classified: Secondary | ICD-10-CM | POA: Diagnosis not present

## 2021-05-07 DIAGNOSIS — M6281 Muscle weakness (generalized): Secondary | ICD-10-CM | POA: Diagnosis not present

## 2021-05-14 DIAGNOSIS — R2681 Unsteadiness on feet: Secondary | ICD-10-CM | POA: Diagnosis not present

## 2021-05-14 DIAGNOSIS — R262 Difficulty in walking, not elsewhere classified: Secondary | ICD-10-CM | POA: Diagnosis not present

## 2021-05-14 DIAGNOSIS — G6289 Other specified polyneuropathies: Secondary | ICD-10-CM | POA: Diagnosis not present

## 2021-05-14 DIAGNOSIS — M6281 Muscle weakness (generalized): Secondary | ICD-10-CM | POA: Diagnosis not present

## 2021-05-17 ENCOUNTER — Inpatient Hospital Stay (HOSPITAL_BASED_OUTPATIENT_CLINIC_OR_DEPARTMENT_OTHER): Payer: Medicare Other | Admitting: Hematology & Oncology

## 2021-05-17 ENCOUNTER — Inpatient Hospital Stay: Payer: Medicare Other

## 2021-05-17 ENCOUNTER — Other Ambulatory Visit: Payer: Self-pay

## 2021-05-17 ENCOUNTER — Inpatient Hospital Stay: Payer: Medicare Other | Attending: Hematology and Oncology

## 2021-05-17 VITALS — BP 140/62 | HR 90 | Temp 98.6°F | Resp 17 | Wt 110.0 lb

## 2021-05-17 DIAGNOSIS — C3432 Malignant neoplasm of lower lobe, left bronchus or lung: Secondary | ICD-10-CM

## 2021-05-17 DIAGNOSIS — Z5111 Encounter for antineoplastic chemotherapy: Secondary | ICD-10-CM | POA: Insufficient documentation

## 2021-05-17 DIAGNOSIS — Z5112 Encounter for antineoplastic immunotherapy: Secondary | ICD-10-CM | POA: Diagnosis not present

## 2021-05-17 DIAGNOSIS — C3430 Malignant neoplasm of lower lobe, unspecified bronchus or lung: Secondary | ICD-10-CM | POA: Diagnosis not present

## 2021-05-17 DIAGNOSIS — Z79899 Other long term (current) drug therapy: Secondary | ICD-10-CM | POA: Diagnosis not present

## 2021-05-17 LAB — CBC WITH DIFFERENTIAL (CANCER CENTER ONLY)
Abs Immature Granulocytes: 0.05 10*3/uL (ref 0.00–0.07)
Basophils Absolute: 0.1 10*3/uL (ref 0.0–0.1)
Basophils Relative: 1 %
Eosinophils Absolute: 0.1 10*3/uL (ref 0.0–0.5)
Eosinophils Relative: 0 %
HCT: 31.5 % — ABNORMAL LOW (ref 36.0–46.0)
Hemoglobin: 10.2 g/dL — ABNORMAL LOW (ref 12.0–15.0)
Immature Granulocytes: 0 %
Lymphocytes Relative: 19 %
Lymphs Abs: 2.4 10*3/uL (ref 0.7–4.0)
MCH: 28.3 pg (ref 26.0–34.0)
MCHC: 32.4 g/dL (ref 30.0–36.0)
MCV: 87.3 fL (ref 80.0–100.0)
Monocytes Absolute: 1.2 10*3/uL — ABNORMAL HIGH (ref 0.1–1.0)
Monocytes Relative: 10 %
Neutro Abs: 8.8 10*3/uL — ABNORMAL HIGH (ref 1.7–7.7)
Neutrophils Relative %: 70 %
Platelet Count: 434 10*3/uL — ABNORMAL HIGH (ref 150–400)
RBC: 3.61 MIL/uL — ABNORMAL LOW (ref 3.87–5.11)
RDW: 14.9 % (ref 11.5–15.5)
WBC Count: 12.7 10*3/uL — ABNORMAL HIGH (ref 4.0–10.5)
nRBC: 0 % (ref 0.0–0.2)

## 2021-05-17 LAB — CMP (CANCER CENTER ONLY)
ALT: 11 U/L (ref 0–44)
AST: 15 U/L (ref 15–41)
Albumin: 3.6 g/dL (ref 3.5–5.0)
Alkaline Phosphatase: 92 U/L (ref 38–126)
Anion gap: 7 (ref 5–15)
BUN: 12 mg/dL (ref 8–23)
CO2: 32 mmol/L (ref 22–32)
Calcium: 9.7 mg/dL (ref 8.9–10.3)
Chloride: 98 mmol/L (ref 98–111)
Creatinine: 0.57 mg/dL (ref 0.44–1.00)
GFR, Estimated: >60 mL/min (ref >60)
Glucose, Bld: 86 mg/dL (ref 70–99)
Potassium: 4.2 mmol/L (ref 3.5–5.1)
Sodium: 137 mmol/L (ref 135–145)
Total Bilirubin: 0.3 mg/dL (ref 0.3–1.2)
Total Protein: 6.6 g/dL (ref 6.5–8.1)

## 2021-05-17 LAB — TSH: TSH: 1.32 u[IU]/mL (ref 0.308–3.960)

## 2021-05-17 MED ORDER — DIPHENHYDRAMINE HCL 50 MG/ML IJ SOLN
50.0000 mg | Freq: Once | INTRAMUSCULAR | Status: AC
  Administered 2021-05-17: 50 mg via INTRAVENOUS
  Filled 2021-05-17: qty 1

## 2021-05-17 MED ORDER — SODIUM CHLORIDE 0.9 % IV SOLN
150.0000 mg/m2 | Freq: Once | INTRAVENOUS | Status: AC
  Administered 2021-05-17: 222 mg via INTRAVENOUS
  Filled 2021-05-17: qty 37

## 2021-05-17 MED ORDER — SODIUM CHLORIDE 0.9 % IV SOLN
300.0000 mg | Freq: Once | INTRAVENOUS | Status: AC
  Administered 2021-05-17: 300 mg via INTRAVENOUS
  Filled 2021-05-17: qty 30

## 2021-05-17 MED ORDER — DIPHENHYDRAMINE HCL 50 MG/ML IJ SOLN: 25.0000 mg | Freq: Once | INTRAMUSCULAR | Status: DC

## 2021-05-17 MED ORDER — SODIUM CHLORIDE 0.9% FLUSH
10.0000 mL | INTRAVENOUS | Status: DC | PRN
  Administered 2021-05-17: 10 mL

## 2021-05-17 MED ORDER — SODIUM CHLORIDE 0.9 % IV SOLN
150.0000 mg | Freq: Once | INTRAVENOUS | Status: AC
  Administered 2021-05-17: 150 mg via INTRAVENOUS
  Filled 2021-05-17: qty 150

## 2021-05-17 MED ORDER — HEPARIN SOD (PORK) LOCK FLUSH 100 UNIT/ML IV SOLN
500.0000 [IU] | Freq: Once | INTRAVENOUS | Status: AC | PRN
  Administered 2021-05-17: 500 [IU]

## 2021-05-17 MED ORDER — SODIUM CHLORIDE 0.9 % IV SOLN: Freq: Once | INTRAVENOUS | Status: AC

## 2021-05-17 MED ORDER — PALONOSETRON HCL INJECTION 0.25 MG/5ML
0.2500 mg | Freq: Once | INTRAVENOUS | Status: AC
  Administered 2021-05-17: 0.25 mg via INTRAVENOUS
  Filled 2021-05-17: qty 5

## 2021-05-17 MED ORDER — SODIUM CHLORIDE 0.9 % IV SOLN
1.0000 mg/kg | Freq: Once | INTRAVENOUS | Status: AC
  Administered 2021-05-17: 50 mg via INTRAVENOUS
  Filled 2021-05-17: qty 10

## 2021-05-17 MED ORDER — SODIUM CHLORIDE 0.9 % IV SOLN
360.0000 mg | Freq: Once | INTRAVENOUS | Status: AC
  Administered 2021-05-17: 360 mg via INTRAVENOUS
  Filled 2021-05-17: qty 12

## 2021-05-17 MED ORDER — SODIUM CHLORIDE 0.9 % IV SOLN
10.0000 mg | Freq: Once | INTRAVENOUS | Status: AC
  Administered 2021-05-17: 10 mg via INTRAVENOUS
  Filled 2021-05-17: qty 10

## 2021-05-17 MED ORDER — FAMOTIDINE 20 MG IN NS 100 ML IVPB
20.0000 mg | Freq: Once | INTRAVENOUS | Status: AC
  Administered 2021-05-17: 20 mg via INTRAVENOUS
  Filled 2021-05-17: qty 20

## 2021-05-17 MED ORDER — FAMOTIDINE IN NACL 20-0.9 MG/50ML-% IV SOLN: 20.0000 mg | Freq: Once | INTRAVENOUS | Status: DC

## 2021-05-17 NOTE — Progress Notes (Signed)
Patient has slept throughout the day after receiving diphenhydramine premed. Dose lowered to 12.5 mg for carbo/paclitaxel and deleted from ipilimumab/nivolumab careplan per Dr. Antonieta Pert instructions.

## 2021-05-17 NOTE — Progress Notes (Signed)

## 2021-05-17 NOTE — Patient Instructions (Signed)
[facility]  Discharge Instructions: Thank you for choosing [facility] to provide your oncology and hematology care.   If you have a lab appointment with the [facility], please go directly to the [facility] and check in at the registration area.  Wear comfortable clothing and clothing appropriate for easy access to any Portacath or PICC line.   We strive to give you quality time with your provider. You may need to reschedule your appointment if you arrive late (15 or more minutes).  Arriving late affects you and other patients whose appointments are after yours.  Also, if you miss three or more appointments without notifying the office, you may be dismissed from the clinic at the provider's discretion.      For prescription refill requests, have your pharmacy contact our office and allow 72 hours for refills to be completed.    Today you received the following chemotherapy and/or immunotherapy agents Carboplatin, Taxol, Yervoy, Opdivo    To help prevent nausea and vomiting after your treatment, we encourage you to take your nausea medication as directed.  BELOW ARE SYMPTOMS THAT SHOULD BE REPORTED IMMEDIATELY: *FEVER GREATER THAN 100.4 F (38 C) OR HIGHER *CHILLS OR SWEATING *NAUSEA AND VOMITING THAT IS NOT CONTROLLED WITH YOUR NAUSEA MEDICATION *UNUSUAL SHORTNESS OF BREATH *UNUSUAL BRUISING OR BLEEDING *URINARY PROBLEMS (pain or burning when urinating, or frequent urination) *BOWEL PROBLEMS (unusual diarrhea, constipation, pain near the anus) TENDERNESS IN MOUTH AND THROAT WITH OR WITHOUT PRESENCE OF ULCERS (sore throat, sores in mouth, or a toothache) UNUSUAL RASH, SWELLING OR PAIN  UNUSUAL VAGINAL DISCHARGE OR ITCHING   Items with * indicate a potential emergency and should be followed up as soon as possible or go to the Emergency Department if any problems should occur.  Please show the CHEMOTHERAPY ALERT CARD or IMMUNOTHERAPY ALERT  CARD at check-in to the Emergency Department and triage nurse. Should you have questions after your visit or need to cancel or reschedule your appointment, please contact [facility]  [phone] and follow the prompts.  Office hours are 8:00 a.m. to 4:30 p.m. Monday - Friday. Please note that voicemails left after 4:00 p.m. may not be returned until the following business day.  We are closed weekends and major holidays. You have access to a nurse at all times for urgent questions. Please call the main number to the clinic [phone] and follow the prompts.  For any non-urgent questions, you may also contact your provider using MyChart. We now offer e-Visits for anyone 73 and older to request care online for non-urgent symptoms. For details visit [email or web address].   Also download the MyChart app! Go to the app store, search "MyChart", open the app, select , and log in with your MyChart username and password.  Due to Covid, a mask is required upon entering the hospital/clinic. If you do not have a mask, one will be given to you upon arrival. For doctor visits, patients may have 1 support person aged 73 or older with them. For treatment visits, patients cannot have anyone with them due to current Covid guidelines and our immunocompromised population.

## 2021-05-17 NOTE — Progress Notes (Signed)
Hematology and Oncology Follow Up Visit  AIREANA RYLAND [id number] 04/17/1946 75 y.o. 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 Maj is back for her follow-up.  She actually tolerated the first cycle of chemotherapy pretty well.  She has gained little bit of weight.  She still uses a walker.  She is still getting some physical therapy.  She has little bit of a cough.  She has had no fever.  There is been no hemoptysis.  She has had no problems with bowels or bladder.  She has had no mouth sores.  There is been no rashes.  She has had some abdominal discomfort.  She has some bony pain after the Neulasta injection.  There is been no headache..  She is resting quite a bit.  I told her this is okay.  Overall, I would say performance status is probably ECOG 2.   Medications:  Current Outpatient Medications:    acetaminophen (TYLENOL) 500 MG tablet, Take 500 mg by mouth daily as needed for headache (pain)., Disp: , Rfl:    ALPRAZolam (XANAX) 1 MG tablet, Take 0.5-1 mg by mouth See admin instructions. Take one tablet (1 mg) by mouth at 5:30am, take 1/2 tablet (0.5 mg) at 7:30am, 3pm and at bedtime., Disp: , Rfl:    AMBULATORY NON FORMULARY MEDICATION, Lift Chair Diagnosis (Patient not taking: Reported on 04/09/2021), Disp: 1 Device, Rfl: 0   AMBULATORY NON FORMULARY MEDICATION, Rollator Diagnosis G 62.89, Disp: 1 Device, Rfl: 0   Ascorbic Acid (VITAMIN C) 1000 MG tablet, Take 1,000 mg by mouth every morning., Disp: , Rfl:    azelastine (OPTIVAR) 0.05 % ophthalmic solution, Place 1 drop into both eyes 2 (two) times daily as needed (dry eyes/itching)., Disp: , Rfl:    dexamethasone (DECADRON) 4 MG tablet, Take 2 tablets (8 mg total) by mouth daily. Start the day after carboplatin  chemotherapy for 3 days., Disp: 30 tablet, Rfl: 1   diclofenac Sodium (VOLTAREN) 1 % GEL, Apply 1 application topically at bedtime., Disp: , Rfl:    gabapentin (NEURONTIN) 300 MG capsule, Take 300 mg by mouth at bedtime as needed (nerve pain/burning in feet)., Disp: , Rfl:    hypromellose (SYSTANE OVERNIGHT THERAPY) 0.3 % GEL ophthalmic ointment, Place 1 application into both eyes at bedtime as needed for dry eyes., Disp: , Rfl:    lamoTRIgine (LAMICTAL) 25 MG tablet, Take 50 mg by mouth every morning., Disp: , Rfl:    lidocaine-prilocaine (EMLA) cream, Apply to affected area once, Disp: 30 g, Rfl: 3   lipase/protease/amylase (CREON) 36000 UNITS CPEP capsule, Take 36,000 Units by mouth See admin instructions. Take two capsules (72000 units) by mouth three times daily with meals and one capsule (36000 units) with snacks, Disp: , Rfl:    magic mouthwash w/lidocaine SOLN, Take 5 mLs by mouth 4 (four) times daily as needed for mouth pain., Disp: 480 mL, Rfl: 0   Multiple Vitamin (MULTIVITAMIN WITH MINERALS) TABS tablet, Take 1 tablet by mouth every morning. Centrum, Disp: , Rfl:    ondansetron (ZOFRAN) 4 MG tablet, Take 4 mg by mouth every 6 (six) hours as needed for nausea or vomiting. (Patient not taking: Reported on 04/09/2021), Disp: , Rfl:    ondansetron (ZOFRAN) 8 MG tablet, Take 1 tablet (8 mg total) by  mouth 2 (two) times daily as needed for refractory nausea / vomiting. Start on day 3 after carboplatin chemo., Disp: 30 tablet, Rfl: 1   Probiotic Product (PROBIOTIC PO), Take 1 tablet by mouth every morning., Disp: , Rfl:    prochlorperazine (COMPAZINE) 10 MG tablet, Take 1 tablet (10 mg total) by mouth every 6 (six) hours as needed (Nausea or vomiting)., Disp: 30 tablet, Rfl: 1   valACYclovir (VALTREX) 500 MG tablet, Take 500 mg by mouth every morning., Disp: , Rfl:   Allergies:  Allergies  Allergen Reactions   Doxycycline Swelling   Prednisone Other (See Comments)    Very emotional, crying,  angry   Penicillins Rash    Has patient had a PCN reaction causing immediate rash, facial/tongue/throat swelling, SOB or lightheadedness with hypotension: yes Has patient had a PCN reaction causing severe rash involving mucus membranes or skin necrosis: no Has patient had a PCN reaction that required hospitalization: no Has patient had a PCN reaction occurring within the last 10 years: no If all of the above answers are "NO", then may proceed with Cephalosporin use.    Sulfonamide Derivatives Rash    Past Medical History, Surgical history, Social history, and Family History were reviewed and updated.  Review of Systems: Review of Systems  Constitutional:  Positive for fatigue.  HENT:  Negative.    Eyes: Negative.   Respiratory: Negative.    Cardiovascular:  Positive for chest pain.  Gastrointestinal:  Positive for nausea.  Endocrine: Negative.   Genitourinary: Negative.    Skin: Negative.   Neurological:  Positive for extremity weakness.  Hematological: Negative.   Psychiatric/Behavioral: Negative.     Physical Exam:  vitals were not taken for this visit.   Wt Readings from Last 3 Encounters:  04/19/21 107 lb (48.5 kg)  04/09/21 107 lb (48.5 kg)  03/28/21 114 lb (51.7 kg)    Physical Exam Vitals reviewed.  HENT:     Head: Normocephalic and atraumatic.  Eyes:     Pupils: Pupils are equal, round, and reactive to light.  Cardiovascular:     Rate and Rhythm: Normal rate and regular rhythm.     Heart sounds: Normal heart sounds.  Pulmonary:     Effort: Pulmonary effort is normal.     Breath sounds: Normal breath sounds.  Abdominal:     General: Bowel sounds are normal.     Palpations: Abdomen is soft.  Musculoskeletal:        General: No tenderness or deformity. Normal range of motion.     Cervical back: Normal range of motion.  Lymphadenopathy:     Cervical: No cervical adenopathy.  Skin:    General: Skin is warm and dry.     Findings: No erythema or rash.   Neurological:     Mental Status: She is alert and oriented to person, place, and time.  Psychiatric:        Behavior: Behavior normal.        Thought Content: Thought content normal.        Judgment: Judgment normal.     Lab Results  Component Value Date   WBC 12.7 (H) 05/17/2021   HGB 10.2 (L) 05/17/2021   HCT 31.5 (L) 05/17/2021   MCV 87.3 05/17/2021   PLT 434 (H) 05/17/2021     Chemistry      Component Value Date/Time   NA 136 04/26/2021 0815   NA 139 11/24/2019 1527   K 4.0 04/26/2021 0815   CL 97 (  L) 04/26/2021 0815   CO2 32 04/26/2021 0815   BUN 11 04/26/2021 0815   BUN 13 11/24/2019 1527   CREATININE 0.55 04/26/2021 0815      Component Value Date/Time   CALCIUM 10.6 (H) 04/26/2021 0815   ALKPHOS 82 04/26/2021 0815   AST 17 04/26/2021 0815   ALT 11 04/26/2021 0815   BILITOT 0.4 04/26/2021 0815      Impression and Plan: Ms. Walla is a very charming 75 year old white female.  I have known her for many years.  She actually got me my wife together..  She now has a locally advanced squamous cell carcinoma of the left lung.  By the staging studies that we have, looks like this is a stage IIIB tumor.  We will go ahead with her second cycle of chemotherapy with immunotherapy.  After this cycle, we will then plan for a PET scan to see how she has responded.  I am still planning on giving her 4 cycles of full dose chemoimmunotherapy.  We will then have to consider what next.  Again, does not look like she has metastatic disease.  We may keep her chemoimmunotherapy with radiation therapy.  I am just happy that she is managing.  Her daughter is doing a fantastic job with her.  I know she will have wonderful Thanksgiving.  We will see her back after Thanksgiving.  She will have the PET scan a couple days before I see her.   Volanda Napoleon, MD 11/11/20228:43 AM

## 2021-05-18 LAB — T4: T4, Total: 6.8 ug/dL (ref 4.5–12.0)

## 2021-05-20 ENCOUNTER — Encounter: Payer: Self-pay | Admitting: *Deleted

## 2021-05-20 ENCOUNTER — Other Ambulatory Visit: Payer: Self-pay

## 2021-05-20 MED ORDER — TRAMADOL HCL 50 MG PO TABS: 50.0000 mg | ORAL_TABLET | Freq: Four times a day (QID) | ORAL | 0 refills | Status: DC | PRN

## 2021-05-21 ENCOUNTER — Encounter: Payer: Self-pay | Admitting: *Deleted

## 2021-05-21 NOTE — Progress Notes (Signed)
Reviewed patient appointment from 05/17/2021. Patient was doing well and received her 2nd cycle of therapy. She will need a PET prior to her follow up appointment on 06/06/21.  Scheduled for 06/04/2021 at 2p with a 1:30p arrival time. She knows only water after 8am.   Called and reviewed with patient and her daughter.  Oncology Nurse Navigator Documentation  Oncology Nurse Navigator Flowsheets 05/21/2021  Abnormal Finding Date -  Confirmed Diagnosis Date -  Planned Course of Treatment -  Phase of Treatment -  Chemotherapy Actual Start Date: -  Navigator Follow Up Date: 06/04/2021  Navigator Follow Up Reason: Scan Review  Navigator Location [facility]  Referral Date to RadOnc/MedOnc -  Navigator Encounter Type Appt/Treatment Plan Review  Telephone Outgoing Call;Education  Treatment Initiated Date -  Patient Visit Type MedOnc  Treatment Phase Active Tx  Barriers/Navigation Needs Coordination of Care;Education  Education Other  Interventions Coordination of Care;Education;Psycho-Social Support  Acuity Level 2-Minimal Needs (1-2 Barriers Identified)  Coordination of Care Radiology  Education Method Verbal;Teach-back  Support Groups/Services Friends and Family  Time Spent with Patient 30

## 2021-06-04 ENCOUNTER — Other Ambulatory Visit: Payer: Self-pay

## 2021-06-04 ENCOUNTER — Ambulatory Visit (HOSPITAL_COMMUNITY)
Admission: RE | Admit: 2021-06-04 | Discharge: 2021-06-04 | Disposition: A | Discharge status: Home / Self Care | Payer: Medicare Other | Source: Ambulatory Visit | Attending: Hematology & Oncology | Admitting: Hematology & Oncology

## 2021-06-04 DIAGNOSIS — C3430 Malignant neoplasm of lower lobe, unspecified bronchus or lung: Secondary | ICD-10-CM | POA: Diagnosis not present

## 2021-06-04 DIAGNOSIS — C3490 Malignant neoplasm of unspecified part of unspecified bronchus or lung: Secondary | ICD-10-CM | POA: Diagnosis not present

## 2021-06-04 LAB — GLUCOSE, CAPILLARY: Glucose-Capillary: 94 mg/dL (ref 70–99)

## 2021-06-04 MED ORDER — FLUDEOXYGLUCOSE F - 18 (FDG) INJECTION
5.5000 | Freq: Once | INTRAVENOUS | Status: AC | PRN
  Administered 2021-06-04: 5.6 via INTRAVENOUS

## 2021-06-05 ENCOUNTER — Encounter: Payer: Self-pay | Admitting: *Deleted

## 2021-06-05 NOTE — Progress Notes (Signed)
Oncology Nurse Navigator Documentation  Oncology Nurse Navigator Flowsheets 06/05/2021  Abnormal Finding Date -  Confirmed Diagnosis Date -  Planned Course of Treatment -  Phase of Treatment -  Chemotherapy Actual Start Date: -  Navigator Follow Up Date: 06/06/2021  Navigator Follow Up Reason: Follow-up Appointment;Chemotherapy  Navigator Location [facility]  Referral Date to RadOnc/MedOnc -  Navigator Encounter Type Scan Review  Telephone -  Treatment Initiated Date -  Patient Visit Type MedOnc  Treatment Phase Active Tx  Barriers/Navigation Needs Coordination of Care;Education  Education -  Interventions None Required  Acuity Level 2-Minimal Needs (1-2 Barriers Identified)  Coordination of Care -  Education Method -  Support Groups/Services Friends and Family  Time Spent with Patient 15

## 2021-06-06 ENCOUNTER — Other Ambulatory Visit: Payer: Self-pay

## 2021-06-06 ENCOUNTER — Inpatient Hospital Stay: Payer: Medicare Other

## 2021-06-06 ENCOUNTER — Inpatient Hospital Stay: Payer: Medicare Other | Attending: Hematology and Oncology

## 2021-06-06 ENCOUNTER — Encounter: Payer: Self-pay | Admitting: Hematology & Oncology

## 2021-06-06 ENCOUNTER — Inpatient Hospital Stay (HOSPITAL_BASED_OUTPATIENT_CLINIC_OR_DEPARTMENT_OTHER): Payer: Medicare Other | Admitting: Hematology & Oncology

## 2021-06-06 ENCOUNTER — Encounter: Payer: Self-pay | Admitting: *Deleted

## 2021-06-06 VITALS — BP 118/69 | HR 86 | Temp 98.4°F | Resp 18 | Wt 106.6 lb

## 2021-06-06 VITALS — BP 158/90 | HR 89 | Resp 17

## 2021-06-06 DIAGNOSIS — Z79899 Other long term (current) drug therapy: Secondary | ICD-10-CM | POA: Insufficient documentation

## 2021-06-06 DIAGNOSIS — C771 Secondary and unspecified malignant neoplasm of intrathoracic lymph nodes: Secondary | ICD-10-CM | POA: Diagnosis not present

## 2021-06-06 DIAGNOSIS — Z5112 Encounter for antineoplastic immunotherapy: Secondary | ICD-10-CM | POA: Diagnosis not present

## 2021-06-06 DIAGNOSIS — C3432 Malignant neoplasm of lower lobe, left bronchus or lung: Secondary | ICD-10-CM | POA: Insufficient documentation

## 2021-06-06 DIAGNOSIS — C3430 Malignant neoplasm of lower lobe, unspecified bronchus or lung: Secondary | ICD-10-CM

## 2021-06-06 DIAGNOSIS — Z5111 Encounter for antineoplastic chemotherapy: Secondary | ICD-10-CM | POA: Diagnosis not present

## 2021-06-06 LAB — CBC WITH DIFFERENTIAL (CANCER CENTER ONLY)
Abs Immature Granulocytes: 0.03 10*3/uL (ref 0.00–0.07)
Basophils Absolute: 0.1 10*3/uL (ref 0.0–0.1)
Basophils Relative: 1 %
Eosinophils Absolute: 0.1 10*3/uL (ref 0.0–0.5)
Eosinophils Relative: 1 %
HCT: 35.3 % — ABNORMAL LOW (ref 36.0–46.0)
Hemoglobin: 11.2 g/dL — ABNORMAL LOW (ref 12.0–15.0)
Immature Granulocytes: 0 %
Lymphocytes Relative: 27 %
Lymphs Abs: 2.2 10*3/uL (ref 0.7–4.0)
MCH: 27.9 pg (ref 26.0–34.0)
MCHC: 31.7 g/dL (ref 30.0–36.0)
MCV: 88 fL (ref 80.0–100.0)
Monocytes Absolute: 0.9 10*3/uL (ref 0.1–1.0)
Monocytes Relative: 11 %
Neutro Abs: 4.7 10*3/uL (ref 1.7–7.7)
Neutrophils Relative %: 60 %
Platelet Count: 338 10*3/uL (ref 150–400)
RBC: 4.01 MIL/uL (ref 3.87–5.11)
RDW: 15.9 % — ABNORMAL HIGH (ref 11.5–15.5)
WBC Count: 8 10*3/uL (ref 4.0–10.5)
nRBC: 0 % (ref 0.0–0.2)

## 2021-06-06 LAB — CMP (CANCER CENTER ONLY)
ALT: 10 U/L (ref 0–44)
AST: 18 U/L (ref 15–41)
Albumin: 4.1 g/dL (ref 3.5–5.0)
Alkaline Phosphatase: 78 U/L (ref 38–126)
Anion gap: 7 (ref 5–15)
BUN: 16 mg/dL (ref 8–23)
CO2: 32 mmol/L (ref 22–32)
Calcium: 10 mg/dL (ref 8.9–10.3)
Chloride: 99 mmol/L (ref 98–111)
Creatinine: 0.63 mg/dL (ref 0.44–1.00)
GFR, Estimated: >60 mL/min (ref >60)
Glucose, Bld: 79 mg/dL (ref 70–99)
Potassium: 4.4 mmol/L (ref 3.5–5.1)
Sodium: 138 mmol/L (ref 135–145)
Total Bilirubin: 0.3 mg/dL (ref 0.3–1.2)
Total Protein: 7.2 g/dL (ref 6.5–8.1)

## 2021-06-06 LAB — TSH: TSH: 1.02 u[IU]/mL (ref 0.308–3.960)

## 2021-06-06 LAB — LACTATE DEHYDROGENASE: LDH: 173 U/L (ref 98–192)

## 2021-06-06 MED ORDER — FAMOTIDINE 20 MG IN NS 100 ML IVPB
20.0000 mg | Freq: Two times a day (BID) | INTRAVENOUS | Status: DC
  Administered 2021-06-06: 20 mg via INTRAVENOUS
  Filled 2021-06-06: qty 20

## 2021-06-06 MED ORDER — SODIUM CHLORIDE 0.9 % IV SOLN: Freq: Once | INTRAVENOUS | Status: DC

## 2021-06-06 MED ORDER — HEPARIN SOD (PORK) LOCK FLUSH 100 UNIT/ML IV SOLN
500.0000 [IU] | Freq: Once | INTRAVENOUS | Status: AC | PRN
  Administered 2021-06-06: 500 [IU]

## 2021-06-06 MED ORDER — SODIUM CHLORIDE 0.9 % IV SOLN
1.0000 mg/kg | Freq: Once | INTRAVENOUS | Status: AC
  Administered 2021-06-06: 50 mg via INTRAVENOUS
  Filled 2021-06-06: qty 10

## 2021-06-06 MED ORDER — SODIUM CHLORIDE 0.9% FLUSH
10.0000 mL | INTRAVENOUS | Status: DC | PRN
  Administered 2021-06-06: 10 mL

## 2021-06-06 MED ORDER — FAMOTIDINE 20 MG IN NS 100 ML IVPB: 20.0000 mg | Freq: Once | INTRAVENOUS | Status: DC

## 2021-06-06 MED ORDER — SODIUM CHLORIDE 0.9 % IV SOLN
150.0000 mg | Freq: Once | INTRAVENOUS | Status: AC
  Administered 2021-06-06: 150 mg via INTRAVENOUS
  Filled 2021-06-06: qty 150

## 2021-06-06 MED ORDER — SODIUM CHLORIDE 0.9 % IV SOLN
360.0000 mg | Freq: Once | INTRAVENOUS | Status: AC
  Administered 2021-06-06: 360 mg via INTRAVENOUS
  Filled 2021-06-06: qty 24

## 2021-06-06 MED ORDER — SODIUM CHLORIDE 0.9 % IV SOLN
10.0000 mg | Freq: Once | INTRAVENOUS | Status: AC
  Administered 2021-06-06: 10 mg via INTRAVENOUS
  Filled 2021-06-06: qty 10

## 2021-06-06 MED ORDER — SODIUM CHLORIDE 0.9 % IV SOLN
150.0000 mg/m2 | Freq: Once | INTRAVENOUS | Status: AC
  Administered 2021-06-06: 222 mg via INTRAVENOUS
  Filled 2021-06-06: qty 37

## 2021-06-06 MED ORDER — SODIUM CHLORIDE 0.9 % IV SOLN: Freq: Once | INTRAVENOUS | Status: AC

## 2021-06-06 MED ORDER — DIPHENHYDRAMINE HCL 50 MG/ML IJ SOLN
12.5000 mg | Freq: Once | INTRAMUSCULAR | Status: AC
  Administered 2021-06-06: 12.5 mg via INTRAVENOUS
  Filled 2021-06-06: qty 1

## 2021-06-06 MED ORDER — COLD PACK MISC ONCOLOGY: 1.0000 | Freq: Once | Status: DC | PRN

## 2021-06-06 MED ORDER — PALONOSETRON HCL INJECTION 0.25 MG/5ML
0.2500 mg | Freq: Once | INTRAVENOUS | Status: AC
  Administered 2021-06-06: 0.25 mg via INTRAVENOUS
  Filled 2021-06-06: qty 5

## 2021-06-06 MED ORDER — SODIUM CHLORIDE 0.9 % IV SOLN
300.0000 mg | Freq: Once | INTRAVENOUS | Status: AC
  Administered 2021-06-06: 300 mg via INTRAVENOUS
  Filled 2021-06-06: qty 30

## 2021-06-06 NOTE — Progress Notes (Signed)
Hematology and Oncology Follow Up Visit  Molly Welch [id number] 1945-08-06 75 y.o. 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  Actually, she is doing pretty well.  She had a PET scan that we did yesterday.  The PET scan showed she was not responding.  The mass in the left lung now measures 6.2 x 5.7 cm.  The SUV is down to 10.3.  She has very low level activity in a right hilar lymph node subcarinal lymph node.  These are not actually seen on the CT scan.  She is gaining a little bit of weight.  A little bit better.  She has had no problems with nausea or vomiting.  She is getting 25% of the Benadryl now.  She really cannot tolerate the full dose of Benadryl.  She has had no problems with pain.  She still is having some issues with walking.  She is still some weakness in her legs.  This might be a paraneoplastic process.  She has had no headache.  There is no cough or shortness of breath.  Overall, her performance status is ECOG 2.     Medications:  Current Outpatient Medications:    acetaminophen (TYLENOL) 500 MG tablet, Take 500 mg by mouth daily as needed for headache (pain)., Disp: , Rfl:    ALPRAZolam (XANAX) 1 MG tablet, Take 0.5-1 mg by mouth See admin instructions. Take one tablet (1 mg) by mouth at 5:30am, take 1/2 tablet (0.5 mg) at 7:30am, 3pm and at bedtime., Disp: , Rfl:    AMBULATORY NON FORMULARY MEDICATION, Lift Chair Diagnosis, Disp: 1 Device, Rfl: 0   AMBULATORY NON FORMULARY MEDICATION, Rollator Diagnosis G 62.89, Disp: 1 Device, Rfl: 0   Ascorbic Acid (VITAMIN C) 1000 MG tablet, Take 1,000 mg by mouth every morning., Disp: , Rfl:    azelastine (OPTIVAR) 0.05 % ophthalmic solution, Place 1 drop into both eyes 2 (two) times  daily as needed (dry eyes/itching)., Disp: , Rfl:    diclofenac Sodium (VOLTAREN) 1 % GEL, Apply 1 application topically at bedtime., Disp: , Rfl:    hypromellose (SYSTANE OVERNIGHT THERAPY) 0.3 % GEL ophthalmic ointment, Place 1 application into both eyes at bedtime as needed for dry eyes., Disp: , Rfl:    lamoTRIgine (LAMICTAL) 25 MG tablet, Take 100 mg by mouth every morning., Disp: , Rfl:    lidocaine-prilocaine (EMLA) cream, Apply to affected area once, Disp: 30 g, Rfl: 3   lipase/protease/amylase (CREON) 36000 UNITS CPEP capsule, Take 36,000 Units by mouth See admin instructions. Take two capsules (72000 units) by mouth three times daily with meals and one capsule (36000 units) with snacks, Disp: , Rfl:    Multiple Vitamin (MULTIVITAMIN WITH MINERALS) TABS tablet, Take 1 tablet by mouth every morning. Centrum, Disp: , Rfl:    ondansetron (ZOFRAN) 4 MG tablet, Take 4 mg by mouth every 6 (six) hours as needed for nausea or vomiting., Disp: , Rfl:    ondansetron (ZOFRAN) 8 MG tablet, Take 1 tablet (8 mg total) by mouth 2 (two) times daily as needed for refractory nausea / vomiting. Start on day 3 after carboplatin chemo., Disp: 30 tablet, Rfl: 1   Probiotic Product (PROBIOTIC PO), Take 1 tablet by mouth every morning., Disp: , Rfl:  prochlorperazine (COMPAZINE) 10 MG tablet, Take 1 tablet (10 mg total) by mouth every 6 (six) hours as needed (Nausea or vomiting)., Disp: 30 tablet, Rfl: 1   traMADol (ULTRAM) 50 MG tablet, Take 1 tablet (50 mg total) by mouth every 6 (six) hours as needed., Disp: 60 tablet, Rfl: 0   valACYclovir (VALTREX) 500 MG tablet, Take 500 mg by mouth every morning., Disp: , Rfl:    dexamethasone (DECADRON) 4 MG tablet, Take 2 tablets (8 mg total) by mouth daily. Start the day after carboplatin chemotherapy for 3 days. (Patient not taking: Reported on 06/06/2021), Disp: 30 tablet, Rfl: 1   gabapentin (NEURONTIN) 300 MG capsule, Take 300 mg by mouth at bedtime as needed (nerve  pain/burning in feet). (Patient not taking: Reported on 06/06/2021), Disp: , Rfl:    magic mouthwash w/lidocaine SOLN, Take 5 mLs by mouth 4 (four) times daily as needed for mouth pain. (Patient not taking: Reported on 06/06/2021), Disp: 480 mL, Rfl: 0  Allergies:  Allergies  Allergen Reactions   Doxycycline Swelling   Prednisone Other (See Comments)    Very emotional, crying, angry   Penicillins Rash    Has patient had a PCN reaction causing immediate rash, facial/tongue/throat swelling, SOB or lightheadedness with hypotension: yes Has patient had a PCN reaction causing severe rash involving mucus membranes or skin necrosis: no Has patient had a PCN reaction that required hospitalization: no Has patient had a PCN reaction occurring within the last 10 years: no If all of the above answers are "NO", then may proceed with Cephalosporin use.    Sulfonamide Derivatives Rash    Past Medical History, Surgical history, Social history, and Family History were reviewed and updated.  Review of Systems: Review of Systems  Constitutional:  Positive for fatigue.  HENT:  Negative.    Eyes: Negative.   Respiratory: Negative.    Cardiovascular:  Positive for chest pain.  Gastrointestinal:  Positive for nausea.  Endocrine: Negative.   Genitourinary: Negative.    Skin: Negative.   Neurological:  Positive for extremity weakness.  Hematological: Negative.   Psychiatric/Behavioral: Negative.     Physical Exam:  weight is 106 lb 9.6 oz (48.4 kg). Her oral temperature is 98.4 F (36.9 C). Her blood pressure is 118/69 and her pulse is 86. Her respiration is 18 and oxygen saturation is 100%.   Wt Readings from Last 3 Encounters:  06/06/21 106 lb 9.6 oz (48.4 kg)  05/17/21 110 lb (49.9 kg)  04/19/21 107 lb (48.5 kg)    Physical Exam Vitals reviewed.  HENT:     Head: Normocephalic and atraumatic.  Eyes:     Pupils: Pupils are equal, round, and reactive to light.  Cardiovascular:     Rate and  Rhythm: Normal rate and regular rhythm.     Heart sounds: Normal heart sounds.  Pulmonary:     Effort: Pulmonary effort is normal.     Breath sounds: Normal breath sounds.  Abdominal:     General: Bowel sounds are normal.     Palpations: Abdomen is soft.  Musculoskeletal:        General: No tenderness or deformity. Normal range of motion.     Cervical back: Normal range of motion.  Lymphadenopathy:     Cervical: No cervical adenopathy.  Skin:    General: Skin is warm and dry.     Findings: No erythema or rash.  Neurological:     Mental Status: She is alert and oriented to person, place,  and time.  Psychiatric:        Behavior: Behavior normal.        Thought Content: Thought content normal.        Judgment: Judgment normal.     Lab Results  Component Value Date   WBC 8.0 06/06/2021   HGB 11.2 (L) 06/06/2021   HCT 35.3 (L) 06/06/2021   MCV 88.0 06/06/2021   PLT 338 06/06/2021     Chemistry      Component Value Date/Time   NA 138 06/06/2021 0818   NA 139 11/24/2019 1527   K 4.4 06/06/2021 0818   CL 99 06/06/2021 0818   CO2 32 06/06/2021 0818   BUN 16 06/06/2021 0818   BUN 13 11/24/2019 1527   CREATININE 0.63 06/06/2021 0818      Component Value Date/Time   CALCIUM 10.0 06/06/2021 0818   ALKPHOS 78 06/06/2021 0818   AST 18 06/06/2021 0818   ALT 10 06/06/2021 0818   BILITOT 0.3 06/06/2021 0818      Impression and Plan: Molly Welch is a very charming 75 year old white female.  I have known her for many years.  She actually got me my wife together..  She now has a locally advanced squamous cell carcinoma of the left lung.  By the staging studies that we have, looks like this is a stage IIIB tumor.  Thankfully, she is responding.  We will go ahead with the third cycle of treatment.  We will have to see how she does after 4 cycles of treatment.  We may think about radiation therapy if she continues to improve.  I am just happy that her quality life is doing a  little bit better.  We will plan to get her back in 3 more weeks.   Volanda Napoleon, MD 12/1/20228:49 AM

## 2021-06-06 NOTE — Progress Notes (Signed)

## 2021-06-06 NOTE — Progress Notes (Signed)
Patient is doing well today. She states she is slowly feeling stronger. She is happy after today's report of response on PET scan. Once her strength improves she is hopeful her mobility will improve.   Oncology Nurse Navigator Documentation  Oncology Nurse Navigator Flowsheets 06/06/2021  Abnormal Finding Date -  Confirmed Diagnosis Date -  Planned Course of Treatment -  Phase of Treatment -  Chemotherapy Actual Start Date: -  Navigator Follow Up Date: 06/28/2021  Navigator Follow Up Reason: Follow-up Appointment;Chemotherapy  Navigator Location [facility]  Referral Date to RadOnc/MedOnc -  Navigator Encounter Type Treatment;Appt/Treatment Plan Review  Telephone -  Treatment Initiated Date -  Patient Visit Type MedOnc  Treatment Phase Active Tx  Barriers/Navigation Needs Coordination of Care;Education  Education -  Interventions Psycho-Social Support  Acuity Level 2-Minimal Needs (1-2 Barriers Identified)  Coordination of Care -  Education Method -  Support Groups/Services Friends and Family  Time Spent with Patient 15

## 2021-06-06 NOTE — Patient Instructions (Signed)
[facility]  Discharge Instructions: Thank you for choosing [facility] to provide your oncology and hematology care.   If you have a lab appointment with the [facility], please go directly to the [facility] and check in at the registration area.  Wear comfortable clothing and clothing appropriate for easy access to any Portacath or PICC line.   We strive to give you quality time with your provider. You may need to reschedule your appointment if you arrive late (15 or more minutes).  Arriving late affects you and other patients whose appointments are after yours.  Also, if you miss three or more appointments without notifying the office, you may be dismissed from the clinic at the provider's discretion.      For prescription refill requests, have your pharmacy contact our office and allow 72 hours for refills to be completed.    Today you received the following chemotherapy and/or immunotherapy agents Carboplatin, Taxol, Yervoy, Opdivo    To help prevent nausea and vomiting after your treatment, we encourage you to take your nausea medication as directed.  BELOW ARE SYMPTOMS THAT SHOULD BE REPORTED IMMEDIATELY: *FEVER GREATER THAN 100.4 F (38 C) OR HIGHER *CHILLS OR SWEATING *NAUSEA AND VOMITING THAT IS NOT CONTROLLED WITH YOUR NAUSEA MEDICATION *UNUSUAL SHORTNESS OF BREATH *UNUSUAL BRUISING OR BLEEDING *URINARY PROBLEMS (pain or burning when urinating, or frequent urination) *BOWEL PROBLEMS (unusual diarrhea, constipation, pain near the anus) TENDERNESS IN MOUTH AND THROAT WITH OR WITHOUT PRESENCE OF ULCERS (sore throat, sores in mouth, or a toothache) UNUSUAL RASH, SWELLING OR PAIN  UNUSUAL VAGINAL DISCHARGE OR ITCHING   Items with * indicate a potential emergency and should be followed up as soon as possible or go to the Emergency Department if any problems should occur.  Please show the CHEMOTHERAPY ALERT CARD or IMMUNOTHERAPY ALERT  CARD at check-in to the Emergency Department and triage nurse. Should you have questions after your visit or need to cancel or reschedule your appointment, please contact [facility]  [phone] and follow the prompts.  Office hours are 8:00 a.m. to 4:30 p.m. Monday - Friday. Please note that voicemails left after 4:00 p.m. may not be returned until the following business day.  We are closed weekends and major holidays. You have access to a nurse at all times for urgent questions. Please call the main number to the clinic [phone] and follow the prompts.  For any non-urgent questions, you may also contact your provider using MyChart. We now offer e-Visits for anyone 40 and older to request care online for non-urgent symptoms. For details visit [email or web address].   Also download the MyChart app! Go to the app store, search "MyChart", open the app, select [facility], and log in with your MyChart username and password.  Due to Covid, a mask is required upon entering the hospital/clinic. If you do not have a mask, one will be given to you upon arrival. For doctor visits, patients may have 1 support person aged 37 or older with them. For treatment visits, patients cannot have anyone with them due to current Covid guidelines and our immunocompromised population.

## 2021-06-07 ENCOUNTER — Telehealth: Payer: Self-pay | Admitting: *Deleted

## 2021-06-07 ENCOUNTER — Other Ambulatory Visit: Payer: Self-pay | Admitting: Hematology & Oncology

## 2021-06-07 ENCOUNTER — Other Ambulatory Visit: Payer: Self-pay | Admitting: *Deleted

## 2021-06-07 DIAGNOSIS — C3432 Malignant neoplasm of lower lobe, left bronchus or lung: Secondary | ICD-10-CM

## 2021-06-07 LAB — T4: T4, Total: 8.8 ug/dL (ref 4.5–12.0)

## 2021-06-07 MED ORDER — CIPROFLOXACIN HCL 500 MG PO TABS: 500.0000 mg | ORAL_TABLET | Freq: Two times a day (BID) | ORAL | 0 refills | Status: DC

## 2021-06-07 NOTE — Telephone Encounter (Signed)
Call received from patient's daughter Molly Welch stating that pt now has a temperature of 101.8.  Dr. Marin Olp notified and order received for pt to take Cipro 500 mg PO BID times ten days.  Molly Welch notified of above and requests that prescription be sent to [facility].  Prescription sent as ordered per Dr. Marin Olp.

## 2021-06-09 ENCOUNTER — Encounter: Payer: Self-pay | Admitting: Hematology & Oncology

## 2021-06-10 ENCOUNTER — Other Ambulatory Visit: Payer: Self-pay | Admitting: *Deleted

## 2021-06-10 MED ORDER — GABAPENTIN 300 MG PO CAPS: 300.0000 mg | ORAL_CAPSULE | Freq: Three times a day (TID) | ORAL | 2 refills | Status: DC

## 2021-06-10 MED ORDER — HYDROCODONE-ACETAMINOPHEN 5-325 MG PO TABS: 1.0000 | ORAL_TABLET | ORAL | 0 refills | Status: DC | PRN

## 2021-06-11 DIAGNOSIS — M6281 Muscle weakness (generalized): Secondary | ICD-10-CM | POA: Diagnosis not present

## 2021-06-11 DIAGNOSIS — R2681 Unsteadiness on feet: Secondary | ICD-10-CM | POA: Diagnosis not present

## 2021-06-11 DIAGNOSIS — R262 Difficulty in walking, not elsewhere classified: Secondary | ICD-10-CM | POA: Diagnosis not present

## 2021-06-11 DIAGNOSIS — G6289 Other specified polyneuropathies: Secondary | ICD-10-CM | POA: Diagnosis not present

## 2021-06-13 ENCOUNTER — Encounter: Payer: Self-pay | Admitting: Hematology & Oncology

## 2021-06-14 DIAGNOSIS — Z23 Encounter for immunization: Secondary | ICD-10-CM | POA: Diagnosis not present

## 2021-06-28 ENCOUNTER — Ambulatory Visit: Payer: Medicare Other | Admitting: Hematology & Oncology

## 2021-06-28 ENCOUNTER — Ambulatory Visit: Payer: Medicare Other

## 2021-06-28 ENCOUNTER — Other Ambulatory Visit: Payer: Medicare Other

## 2021-07-02 ENCOUNTER — Inpatient Hospital Stay: Payer: Medicare Other

## 2021-07-02 ENCOUNTER — Inpatient Hospital Stay (HOSPITAL_BASED_OUTPATIENT_CLINIC_OR_DEPARTMENT_OTHER): Payer: Medicare Other | Admitting: Hematology & Oncology

## 2021-07-02 ENCOUNTER — Inpatient Hospital Stay (HOSPITAL_COMMUNITY)
Admission: EM | Admit: 2021-07-02 | Discharge: 2021-07-11 | DRG: 871 | Disposition: A | Discharge status: Home Health | Payer: Medicare Other | Attending: Family Medicine | Admitting: Family Medicine

## 2021-07-02 ENCOUNTER — Encounter: Payer: Self-pay | Admitting: *Deleted

## 2021-07-02 ENCOUNTER — Emergency Department (HOSPITAL_COMMUNITY): Payer: Medicare Other

## 2021-07-02 ENCOUNTER — Encounter: Payer: Self-pay | Admitting: Hematology & Oncology

## 2021-07-02 ENCOUNTER — Encounter (HOSPITAL_COMMUNITY): Payer: Self-pay | Admitting: Emergency Medicine

## 2021-07-02 ENCOUNTER — Other Ambulatory Visit: Payer: Self-pay

## 2021-07-02 VITALS — BP 126/65 | HR 80 | Temp 98.4°F | Resp 18 | Wt 111.0 lb

## 2021-07-02 DIAGNOSIS — Z66 Do not resuscitate: Secondary | ICD-10-CM | POA: Diagnosis present

## 2021-07-02 DIAGNOSIS — J159 Unspecified bacterial pneumonia: Secondary | ICD-10-CM | POA: Diagnosis present

## 2021-07-02 DIAGNOSIS — D849 Immunodeficiency, unspecified: Secondary | ICD-10-CM | POA: Diagnosis not present

## 2021-07-02 DIAGNOSIS — R579 Shock, unspecified: Secondary | ICD-10-CM | POA: Diagnosis not present

## 2021-07-02 DIAGNOSIS — C3432 Malignant neoplasm of lower lobe, left bronchus or lung: Secondary | ICD-10-CM | POA: Diagnosis not present

## 2021-07-02 DIAGNOSIS — R079 Chest pain, unspecified: Secondary | ICD-10-CM

## 2021-07-02 DIAGNOSIS — Z20822 Contact with and (suspected) exposure to covid-19: Secondary | ICD-10-CM | POA: Diagnosis present

## 2021-07-02 DIAGNOSIS — R918 Other nonspecific abnormal finding of lung field: Secondary | ICD-10-CM | POA: Diagnosis not present

## 2021-07-02 DIAGNOSIS — J849 Interstitial pulmonary disease, unspecified: Secondary | ICD-10-CM

## 2021-07-02 DIAGNOSIS — E440 Moderate protein-calorie malnutrition: Secondary | ICD-10-CM | POA: Diagnosis present

## 2021-07-02 DIAGNOSIS — J8410 Pulmonary fibrosis, unspecified: Secondary | ICD-10-CM | POA: Diagnosis not present

## 2021-07-02 DIAGNOSIS — J84112 Idiopathic pulmonary fibrosis: Secondary | ICD-10-CM | POA: Diagnosis not present

## 2021-07-02 DIAGNOSIS — J189 Pneumonia, unspecified organism: Secondary | ICD-10-CM | POA: Diagnosis not present

## 2021-07-02 DIAGNOSIS — F419 Anxiety disorder, unspecified: Secondary | ICD-10-CM | POA: Diagnosis present

## 2021-07-02 DIAGNOSIS — K580 Irritable bowel syndrome with diarrhea: Secondary | ICD-10-CM | POA: Diagnosis not present

## 2021-07-02 DIAGNOSIS — J9811 Atelectasis: Secondary | ICD-10-CM | POA: Diagnosis not present

## 2021-07-02 DIAGNOSIS — R509 Fever, unspecified: Secondary | ICD-10-CM | POA: Diagnosis not present

## 2021-07-02 DIAGNOSIS — R6521 Severe sepsis with septic shock: Secondary | ICD-10-CM | POA: Diagnosis present

## 2021-07-02 DIAGNOSIS — A419 Sepsis, unspecified organism: Principal | ICD-10-CM | POA: Diagnosis present

## 2021-07-02 DIAGNOSIS — Z743 Need for continuous supervision: Secondary | ICD-10-CM | POA: Diagnosis not present

## 2021-07-02 DIAGNOSIS — I499 Cardiac arrhythmia, unspecified: Secondary | ICD-10-CM | POA: Diagnosis not present

## 2021-07-02 DIAGNOSIS — J432 Centrilobular emphysema: Secondary | ICD-10-CM | POA: Diagnosis not present

## 2021-07-02 DIAGNOSIS — R0902 Hypoxemia: Secondary | ICD-10-CM | POA: Diagnosis not present

## 2021-07-02 DIAGNOSIS — C3430 Malignant neoplasm of lower lobe, unspecified bronchus or lung: Secondary | ICD-10-CM

## 2021-07-02 DIAGNOSIS — Z8249 Family history of ischemic heart disease and other diseases of the circulatory system: Secondary | ICD-10-CM | POA: Diagnosis not present

## 2021-07-02 DIAGNOSIS — R0689 Other abnormalities of breathing: Secondary | ICD-10-CM | POA: Diagnosis not present

## 2021-07-02 DIAGNOSIS — E876 Hypokalemia: Secondary | ICD-10-CM | POA: Diagnosis not present

## 2021-07-02 DIAGNOSIS — R64 Cachexia: Secondary | ICD-10-CM | POA: Diagnosis present

## 2021-07-02 DIAGNOSIS — C3431 Malignant neoplasm of lower lobe, right bronchus or lung: Secondary | ICD-10-CM

## 2021-07-02 DIAGNOSIS — L659 Nonscarring hair loss, unspecified: Secondary | ICD-10-CM | POA: Diagnosis not present

## 2021-07-02 DIAGNOSIS — Z87891 Personal history of nicotine dependence: Secondary | ICD-10-CM | POA: Diagnosis not present

## 2021-07-02 DIAGNOSIS — R682 Dry mouth, unspecified: Secondary | ICD-10-CM | POA: Diagnosis not present

## 2021-07-02 DIAGNOSIS — D638 Anemia in other chronic diseases classified elsewhere: Secondary | ICD-10-CM | POA: Diagnosis present

## 2021-07-02 DIAGNOSIS — U071 COVID-19: Secondary | ICD-10-CM | POA: Diagnosis not present

## 2021-07-02 DIAGNOSIS — R9389 Abnormal findings on diagnostic imaging of other specified body structures: Secondary | ICD-10-CM | POA: Diagnosis present

## 2021-07-02 DIAGNOSIS — D709 Neutropenia, unspecified: Secondary | ICD-10-CM | POA: Diagnosis present

## 2021-07-02 DIAGNOSIS — R6889 Other general symptoms and signs: Secondary | ICD-10-CM | POA: Diagnosis not present

## 2021-07-02 DIAGNOSIS — E871 Hypo-osmolality and hyponatremia: Secondary | ICD-10-CM | POA: Diagnosis not present

## 2021-07-02 DIAGNOSIS — R5381 Other malaise: Secondary | ICD-10-CM

## 2021-07-02 DIAGNOSIS — Z9981 Dependence on supplemental oxygen: Secondary | ICD-10-CM | POA: Diagnosis not present

## 2021-07-02 DIAGNOSIS — Z681 Body mass index (BMI) 19 or less, adult: Secondary | ICD-10-CM | POA: Diagnosis not present

## 2021-07-02 DIAGNOSIS — R531 Weakness: Secondary | ICD-10-CM | POA: Diagnosis not present

## 2021-07-02 DIAGNOSIS — J9601 Acute respiratory failure with hypoxia: Secondary | ICD-10-CM | POA: Diagnosis not present

## 2021-07-02 DIAGNOSIS — I959 Hypotension, unspecified: Secondary | ICD-10-CM | POA: Diagnosis not present

## 2021-07-02 DIAGNOSIS — Z79899 Other long term (current) drug therapy: Secondary | ICD-10-CM

## 2021-07-02 DIAGNOSIS — F32A Depression, unspecified: Secondary | ICD-10-CM | POA: Diagnosis present

## 2021-07-02 DIAGNOSIS — C3490 Malignant neoplasm of unspecified part of unspecified bronchus or lung: Secondary | ICD-10-CM | POA: Diagnosis not present

## 2021-07-02 DIAGNOSIS — Z9221 Personal history of antineoplastic chemotherapy: Secondary | ICD-10-CM

## 2021-07-02 DIAGNOSIS — D72829 Elevated white blood cell count, unspecified: Secondary | ICD-10-CM | POA: Diagnosis not present

## 2021-07-02 DIAGNOSIS — K5900 Constipation, unspecified: Secondary | ICD-10-CM | POA: Diagnosis not present

## 2021-07-02 DIAGNOSIS — E872 Acidosis, unspecified: Secondary | ICD-10-CM | POA: Diagnosis not present

## 2021-07-02 DIAGNOSIS — J811 Chronic pulmonary edema: Secondary | ICD-10-CM | POA: Diagnosis not present

## 2021-07-02 DIAGNOSIS — Z90710 Acquired absence of both cervix and uterus: Secondary | ICD-10-CM

## 2021-07-02 DIAGNOSIS — I2510 Atherosclerotic heart disease of native coronary artery without angina pectoris: Secondary | ICD-10-CM | POA: Diagnosis not present

## 2021-07-02 DIAGNOSIS — J90 Pleural effusion, not elsewhere classified: Secondary | ICD-10-CM | POA: Diagnosis not present

## 2021-07-02 DIAGNOSIS — R000 Tachycardia, unspecified: Secondary | ICD-10-CM | POA: Diagnosis not present

## 2021-07-02 DIAGNOSIS — R0602 Shortness of breath: Secondary | ICD-10-CM | POA: Diagnosis not present

## 2021-07-02 LAB — CMP (CANCER CENTER ONLY)
ALT: 11 U/L (ref 0–44)
AST: 18 U/L (ref 15–41)
Albumin: 4 g/dL (ref 3.5–5.0)
Alkaline Phosphatase: 72 U/L (ref 38–126)
Anion gap: 8 (ref 5–15)
BUN: 18 mg/dL (ref 8–23)
CO2: 30 mmol/L (ref 22–32)
Calcium: 10.1 mg/dL (ref 8.9–10.3)
Chloride: 98 mmol/L (ref 98–111)
Creatinine: 0.66 mg/dL (ref 0.44–1.00)
GFR, Estimated: >60 mL/min (ref >60)
Glucose, Bld: 94 mg/dL (ref 70–99)
Potassium: 4.1 mmol/L (ref 3.5–5.1)
Sodium: 136 mmol/L (ref 135–145)
Total Bilirubin: 0.3 mg/dL (ref 0.3–1.2)
Total Protein: 7.4 g/dL (ref 6.5–8.1)

## 2021-07-02 LAB — TSH: TSH: 1.221 u[IU]/mL (ref 0.308–3.960)

## 2021-07-02 LAB — CBC WITH DIFFERENTIAL (CANCER CENTER ONLY)
Abs Immature Granulocytes: 0.03 10*3/uL (ref 0.00–0.07)
Basophils Absolute: 0 10*3/uL (ref 0.0–0.1)
Basophils Relative: 1 %
Eosinophils Absolute: 0.2 10*3/uL (ref 0.0–0.5)
Eosinophils Relative: 3 %
HCT: 35.9 % — ABNORMAL LOW (ref 36.0–46.0)
Hemoglobin: 11.7 g/dL — ABNORMAL LOW (ref 12.0–15.0)
Immature Granulocytes: 1 %
Lymphocytes Relative: 26 %
Lymphs Abs: 1.7 10*3/uL (ref 0.7–4.0)
MCH: 29.2 pg (ref 26.0–34.0)
MCHC: 32.6 g/dL (ref 30.0–36.0)
MCV: 89.5 fL (ref 80.0–100.0)
Monocytes Absolute: 0.9 10*3/uL (ref 0.1–1.0)
Monocytes Relative: 14 %
Neutro Abs: 3.7 10*3/uL (ref 1.7–7.7)
Neutrophils Relative %: 55 %
Platelet Count: 236 10*3/uL (ref 150–400)
RBC: 4.01 MIL/uL (ref 3.87–5.11)
RDW: 17.1 % — ABNORMAL HIGH (ref 11.5–15.5)
WBC Count: 6.6 10*3/uL (ref 4.0–10.5)
nRBC: 0 % (ref 0.0–0.2)

## 2021-07-02 LAB — LACTATE DEHYDROGENASE: LDH: 196 U/L — ABNORMAL HIGH (ref 98–192)

## 2021-07-02 MED ORDER — SODIUM CHLORIDE 0.9 % IV SOLN
300.0000 mg | Freq: Once | INTRAVENOUS | Status: AC
  Administered 2021-07-02: 15:00:00 300 mg via INTRAVENOUS
  Filled 2021-07-02: qty 30

## 2021-07-02 MED ORDER — SODIUM CHLORIDE 0.9 % IV SOLN
150.0000 mg | Freq: Once | INTRAVENOUS | Status: AC
  Administered 2021-07-02: 10:00:00 150 mg via INTRAVENOUS
  Filled 2021-07-02: qty 150

## 2021-07-02 MED ORDER — SODIUM CHLORIDE 0.9 % IV SOLN
10.0000 mg | Freq: Once | INTRAVENOUS | Status: AC
  Administered 2021-07-02: 09:00:00 10 mg via INTRAVENOUS
  Filled 2021-07-02: qty 10

## 2021-07-02 MED ORDER — HEPARIN SOD (PORK) LOCK FLUSH 100 UNIT/ML IV SOLN
500.0000 [IU] | Freq: Once | INTRAVENOUS | Status: AC | PRN
  Administered 2021-07-02: 15:00:00 500 [IU]

## 2021-07-02 MED ORDER — SODIUM CHLORIDE 0.9 % IV SOLN
150.0000 mg/m2 | Freq: Once | INTRAVENOUS | Status: AC
  Administered 2021-07-02: 12:00:00 222 mg via INTRAVENOUS
  Filled 2021-07-02: qty 37

## 2021-07-02 MED ORDER — SODIUM CHLORIDE 0.9 % IV SOLN
360.0000 mg | Freq: Once | INTRAVENOUS | Status: AC
  Administered 2021-07-02: 10:00:00 360 mg via INTRAVENOUS
  Filled 2021-07-02: qty 12

## 2021-07-02 MED ORDER — SODIUM CHLORIDE 0.9 % IV SOLN
1.0000 mg/kg | Freq: Once | INTRAVENOUS | Status: AC
  Administered 2021-07-02: 11:00:00 50 mg via INTRAVENOUS
  Filled 2021-07-02: qty 10

## 2021-07-02 MED ORDER — PALONOSETRON HCL INJECTION 0.25 MG/5ML
0.2500 mg | Freq: Once | INTRAVENOUS | Status: AC
  Administered 2021-07-02: 09:00:00 0.25 mg via INTRAVENOUS
  Filled 2021-07-02: qty 5

## 2021-07-02 MED ORDER — SODIUM CHLORIDE 0.9% FLUSH
10.0000 mL | INTRAVENOUS | Status: DC | PRN
  Administered 2021-07-02: 15:00:00 10 mL

## 2021-07-02 MED ORDER — FAMOTIDINE 20 MG IN NS 100 ML IVPB
20.0000 mg | Freq: Once | INTRAVENOUS | Status: AC
  Administered 2021-07-02: 20 mg via INTRAVENOUS
  Filled 2021-07-02: qty 20

## 2021-07-02 MED ORDER — DIPHENHYDRAMINE HCL 50 MG/ML IJ SOLN
12.5000 mg | Freq: Once | INTRAMUSCULAR | Status: AC
  Administered 2021-07-02: 09:00:00 12.5 mg via INTRAVENOUS
  Filled 2021-07-02: qty 1

## 2021-07-02 MED ORDER — SODIUM CHLORIDE 0.9 % IV SOLN
Freq: Once | INTRAVENOUS | Status: AC
Start: 2021-07-02 — End: 2021-07-02

## 2021-07-02 NOTE — ED Triage Notes (Signed)
Pt arrived via EMS from home. Pt has hx of lung cancer and had a new chemo treatment today. Pt spiked a fever and feels weak today. Pt's fever at home was 103 F. Pt has been hypotensive with EMS. Pt took 1000mg  of tylenol at 9:17pm

## 2021-07-02 NOTE — Patient Instructions (Signed)

## 2021-07-02 NOTE — Progress Notes (Signed)
Patient is here for her next cycle of treatment. She states treatment has been difficult and she feels so wiped out after each cycle. She states despite this she was able to enjoy Christmas with her family.   Patient asks about potential financial aid resources to help with copays. Message sent to our financial advocate. Will need one further cycle after today before needing scans.   Oncology Nurse Navigator Documentation  Oncology Nurse Navigator Flowsheets 07/02/2021  Abnormal Finding Date -  Confirmed Diagnosis Date -  Planned Course of Treatment -  Phase of Treatment -  Chemotherapy Actual Start Date: -  Navigator Follow Up Date: 07/29/2021  Navigator Follow Up Reason: Follow-up Appointment;Chemotherapy  Navigator Location [facility]  Referral Date to RadOnc/MedOnc -  Navigator Encounter Type Treatment;Appt/Treatment Plan Review  Telephone -  Treatment Initiated Date -  Patient Visit Type MedOnc  Treatment Phase Active Tx  Barriers/Navigation Needs Coordination of Care;Education  Education -  Interventions Psycho-Social Support;Referrals  Acuity Level 2-Minimal Needs (1-2 Barriers Identified)  Referrals Financial Counseling  Coordination of Care -  Education Method -  Support Groups/Services Friends and Family  Time Spent with Patient 15

## 2021-07-02 NOTE — Progress Notes (Signed)
Dr. Marin Olp would like the patient to receive [facility] today. Orders placed in the careplan per his instructions.

## 2021-07-02 NOTE — Patient Instructions (Addendum)
M[facility] AT [facility]  Discharge Instructions: Thank you for choosing [facility] to provide your oncology and hematology care.   If you have a lab appointment with the [facility], please go directly to the [facility] and check in at the registration area.  Wear comfortable clothing and clothing appropriate for easy access to any Portacath or PICC line.   We strive to give you quality time with your provider. You may need to reschedule your appointment if you arrive late (15 or more minutes).  Arriving late affects you and other patients whose appointments are after yours.  Also, if you miss three or more appointments without notifying the office, you may be dismissed from the clinic at the providers discretion.      For prescription refill requests, have your pharmacy contact our office and allow 72 hours for refills to be completed.    Today you received the following chemotherapy and/or immunotherapy agents Carbo, Taxol< opdivo, Yervoy   To help prevent nausea and vomiting after your treatment, we encourage you to take your nausea medication as directed.  BELOW ARE SYMPTOMS THAT SHOULD BE REPORTED IMMEDIATELY: *FEVER GREATER THAN 100.4 F (38 C) OR HIGHER *CHILLS OR SWEATING *NAUSEA AND VOMITING THAT IS NOT CONTROLLED WITH YOUR NAUSEA MEDICATION *UNUSUAL SHORTNESS OF BREATH *UNUSUAL BRUISING OR BLEEDING *URINARY PROBLEMS (pain or burning when urinating, or frequent urination) *BOWEL PROBLEMS (unusual diarrhea, constipation, pain near the anus) TENDERNESS IN MOUTH AND THROAT WITH OR WITHOUT PRESENCE OF ULCERS (sore throat, sores in mouth, or a toothache) UNUSUAL RASH, SWELLING OR PAIN  UNUSUAL VAGINAL DISCHARGE OR ITCHING   Items with * indicate a potential emergency and should be followed up as soon as possible or go to the Emergency Department if any problems should occur.  Please show the CHEMOTHERAPY ALERT CARD or IMMUNOTHERAPY ALERT CARD at  check-in to the Emergency Department and triage nurse. Should you have questions after your visit or need to cancel or reschedule your appointment, please contact Molly Welch  [phone] and follow the prompts.  Office hours are 8:00 a.m. to 4:30 p.m. Monday - Friday. Please note that voicemails left after 4:00 p.m. may not be returned until the following business day.  We are closed weekends and major holidays. You have access to a nurse at all times for urgent questions. Please call the main number to the clinic [phone] and follow the prompts.  For any non-urgent questions, you may also contact your provider using MyChart. We now offer e-Visits for anyone 24 and older to request care online for non-urgent symptoms. For details visit [email or web address].   Also download the MyChart app! Go to the app store, search "MyChart", open the app, select [facility], and log in with your MyChart username and password.  Due to Covid, a mask is required upon entering the hospital/clinic. If you do not have a mask, one will be given to you upon arrival. For doctor visits, patients may have 1 support person aged 2 or older with them. For treatment visits, patients cannot have anyone with them due to current Covid guidelines and our immunocompromised population.

## 2021-07-02 NOTE — Progress Notes (Signed)
Hematology and Oncology Follow Up Visit  Molly Welch [id number] February 21, 1946 75 y.o. 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  She comes in with her daughter.  They had a good Christmas.  I see her in church.  She actually is doing nicely.  She did have a nice response with her last PET scan.  She is eating a bit better.  I think her weight might be up a little bit more.  She has had no diarrhea.  There is been no rashes.  She has had no cough or shortness of breath.  She has had no problems with pain.  Again, is happy that her weight keeps going up.  Currently, I would say performance status is probably ECOG 2.      Medications:  Current Outpatient Medications:    acetaminophen (TYLENOL) 500 MG tablet, Take 500 mg by mouth daily as needed for headache (pain)., Disp: , Rfl:    ALPRAZolam (XANAX) 1 MG tablet, Take 0.5-1 mg by mouth See admin instructions. Take one tablet (1 mg) by mouth at 5:30am, take 1/2 tablet (0.5 mg) at 7:30am, 3pm and at bedtime., Disp: , Rfl:    AMBULATORY NON FORMULARY MEDICATION, Lift Chair Diagnosis, Disp: 1 Device, Rfl: 0   AMBULATORY NON FORMULARY MEDICATION, Rollator Diagnosis G 62.89, Disp: 1 Device, Rfl: 0   Ascorbic Acid (VITAMIN C) 1000 MG tablet, Take 1,000 mg by mouth every morning., Disp: , Rfl:    azelastine (OPTIVAR) 0.05 % ophthalmic solution, Place 1 drop into both eyes 2 (two) times daily as needed (dry eyes/itching)., Disp: , Rfl:    ciprofloxacin (CIPRO) 500 MG tablet, Take 1 tablet (500 mg total) by mouth 2 (two) times daily., Disp: 20 tablet, Rfl: 0   dexamethasone (DECADRON) 4 MG tablet, Take 2 tablets (8 mg total) by mouth daily. Start the day after carboplatin chemotherapy for 3 days. (Patient  not taking: Reported on 06/06/2021), Disp: 30 tablet, Rfl: 1   diclofenac Sodium (VOLTAREN) 1 % GEL, Apply 1 application topically at bedtime., Disp: , Rfl:    gabapentin (NEURONTIN) 300 MG capsule, Take 1 capsule (300 mg total) by mouth 3 (three) times daily., Disp: 90 capsule, Rfl: 2   HYDROcodone-acetaminophen (NORCO/VICODIN) 5-325 MG tablet, Take 1 tablet by mouth every 4 (four) hours as needed for moderate pain., Disp: 60 tablet, Rfl: 0   hypromellose (SYSTANE OVERNIGHT THERAPY) 0.3 % GEL ophthalmic ointment, Place 1 application into both eyes at bedtime as needed for dry eyes., Disp: , Rfl:    lamoTRIgine (LAMICTAL) 25 MG tablet, Take 100 mg by mouth every morning., Disp: , Rfl:    lidocaine-prilocaine (EMLA) cream, Apply to affected area once, Disp: 30 g, Rfl: 3   lipase/protease/amylase (CREON) 36000 UNITS CPEP capsule, Take 36,000 Units by mouth See admin instructions. Take two capsules (72000 units) by mouth three times daily with meals and one capsule (36000 units) with snacks, Disp: , Rfl:    magic mouthwash w/lidocaine SOLN, Take 5 mLs by mouth 4 (four) times daily as needed for mouth pain. (Patient not taking: Reported on 06/06/2021), Disp: 480 mL, Rfl: 0   Multiple Vitamin (MULTIVITAMIN WITH MINERALS) TABS tablet, Take 1 tablet by mouth every morning. Centrum, Disp: , Rfl:    ondansetron (  4 MG tablet, Take 4 mg by mouth every 6 (six) hours as needed for nausea or vomiting., Disp: , Rfl:  °  ondansetron (ZOFRAN) 8 MG tablet, Take 1 tablet (8 mg total) by mouth 2 (two) times daily as needed for refractory nausea / vomiting. Start on day 3 after carboplatin chemo., Disp: 30 tablet, Rfl: 1 °  Probiotic Product (PROBIOTIC PO), Take 1 tablet by mouth every morning., Disp: , Rfl:  °  prochlorperazine (COMPAZINE) 10 MG tablet, Take 1 tablet (10 mg total) by mouth every 6 (six) hours as needed (Nausea or vomiting)., Disp: 30 tablet, Rfl: 1 °  traMADol (ULTRAM) 50 MG tablet, Take 1 tablet (50 mg  total) by mouth every 6 (six) hours as needed., Disp: 60 tablet, Rfl: 0 °  valACYclovir (VALTREX) 500 MG tablet, Take 500 mg by mouth every morning., Disp: , Rfl:  ° °Allergies:  °Allergies  °Allergen Reactions  ° Doxycycline Swelling  ° Prednisone Other (See Comments)  °  Very emotional, crying, angry  ° Penicillin G Other (See Comments)  ° Sulfa Antibiotics Other (See Comments)  ° Penicillins Rash  °  Has patient had a PCN reaction causing immediate rash, facial/tongue/throat swelling, SOB or lightheadedness with hypotension: yes °Has patient had a PCN reaction causing severe rash involving mucus membranes or skin necrosis: no °Has patient had a PCN reaction that required hospitalization: no °Has patient had a PCN reaction occurring within the last 10 years: no °If all of the above answers are "NO", then may proceed with Cephalosporin use. °  ° Sulfonamide Derivatives Rash  ° ° °Past Medical History, Surgical history, Social history, and Family History were reviewed and updated. ° °Review of Systems: °Review of Systems  °Constitutional:  Positive for fatigue.  °HENT:  Negative.    °Eyes: Negative.   °Respiratory: Negative.    °Cardiovascular:  Positive for chest pain.  °Gastrointestinal:  Positive for nausea.  °Endocrine: Negative.   °Genitourinary: Negative.    °Skin: Negative.   °Neurological:  Positive for extremity weakness.  °Hematological: Negative.   °Psychiatric/Behavioral: Negative.    ° °Physical Exam: ° weight is 111 lb (50.3 kg). Her oral temperature is 98.4 °F (36.9 °C). Her blood pressure is 126/65 and her pulse is 80. Her respiration is 18 and oxygen saturation is 100%.  ° °Wt Readings from Last 3 Encounters:  °07/02/21 111 lb (50.3 kg)  °06/06/21 106 lb 9.6 oz (48.4 kg)  °05/17/21 110 lb (49.9 kg)  ° ° °Physical Exam °Vitals reviewed.  °HENT:  °   Head: Normocephalic and atraumatic.  °Eyes:  °   Pupils: Pupils are equal, round, and reactive to light.  °Cardiovascular:  °   Rate and Rhythm: Normal  rate and regular rhythm.  °   Heart sounds: Normal heart sounds.  °Pulmonary:  °   Effort: Pulmonary effort is normal.  °   Breath sounds: Normal breath sounds.  °Abdominal:  °   General: Bowel sounds are normal.  °   Palpations: Abdomen is soft.  °Musculoskeletal:     °   General: No tenderness or deformity. Normal range of motion.  °   Cervical back: Normal range of motion.  °Lymphadenopathy:  °   Cervical: No cervical adenopathy.  °Skin: °   General: Skin is warm and dry.  °   Findings: No erythema or rash.  °Neurological:  °   Mental Status: She is alert and oriented to person, place, and time.  °Psychiatric:     °     Behavior: Behavior normal.     °   Thought Content: Thought content normal.     °   Judgment: Judgment normal.  ° ° ° °Lab Results  °Component Value Date  ° WBC 6.6 07/02/2021  ° HGB 11.7 (L) 07/02/2021  ° HCT 35.9 (L) 07/02/2021  ° MCV 89.5 07/02/2021  ° PLT 236 07/02/2021  ° °  Chemistry   °   °Component Value Date/Time  ° NA 138 06/06/2021 0818  ° NA 139 11/24/2019 1527  ° K 4.4 06/06/2021 0818  ° CL 99 06/06/2021 0818  ° CO2 32 06/06/2021 0818  ° BUN 16 06/06/2021 0818  ° BUN 13 11/24/2019 1527  ° CREATININE 0.63 06/06/2021 0818  °    °Component Value Date/Time  ° CALCIUM 10.0 06/06/2021 0818  ° ALKPHOS 78 06/06/2021 0818  ° AST 18 06/06/2021 0818  ° ALT 10 06/06/2021 0818  ° BILITOT 0.3 06/06/2021 0818  °  ° ° °Impression and Plan: °Ms. Aydelott is a very charming 75-year-old white female.  I have known her for many years.  She actually got me and my wife together..  She now has a locally advanced squamous cell carcinoma of the left lung.  By the staging studies that we have, looks like this is a stage IIIB tumor. ° °Again, she is responding.  We will go ahead with her fourth cycle of treatment.  I would like to try to give her another cycle after this cycle.  We would then repeat a PET scan.  If everything still looks good, then we will see about radiation therapy. ° °I just worry about  radiation with her and not being able to swallow well. ° °I am just happy that she is responding.  I am happy that her quality of life is improving. ° ° ° °Peter R Ennever, MD °12/27/20228:20 AM  °

## 2021-07-02 NOTE — Progress Notes (Signed)
Ipilimumab (YERVOY) Patient Monitoring Assessment   Is the patient experiencing any of the following general symptoms?:  [] Difficulty performing normal activities [] Feeling sluggish or cold all the time [] Unusual weight gain [] Constant or unusual headaches [] Feeling dizzy or faint [] Changes in eyesight (blurry vision, double vision, or other vision problems) [] Changes in mood or behavior (ex: decreased sex drive, irritability, or forgetfulness) [] Starting new medications (ex: steroids, other medications that lower immune response) [] Patient is not experiencing any of the general symptoms above.    Gastrointestinal  Patient is having 1 bowel movements each day.  Is this different from baseline? [] Yes [x] No Are your stools watery or do they have a foul smell? [] Yes [x] No Have you seen blood in your stools? [] Yes [] No Are your stools dark, tarry, or sticky? [] Yes [x] No Are you having pain or tenderness in your belly? [] Yes [x] No  Skin Does your skin itch? [] Yes [x] No Do you have a rash? [] Yes [x] No Has your skin blistered and/or peeled? [] Yes [x] No Do you have sores in your mouth? [] Yes [x] No  Hepatic Has your urine been dark or tea colored? [] Yes [x] No Have you noticed that your skin or the whites of your eyes are turning yellow? [] Yes [x] No Are you bleeding or bruising more easily than normal? [] Yes [x] No Are you nauseous and/or vomiting? [] Yes [x] No Do you have pain on the right side of your stomach? [] Yes [x] No  Neurologic  Are you having unusual weakness of legs, arms, or face? [] Yes [x] No Are you having numbness or tingling in your hands or feet? [] Yes [x] No  Tildon Husky

## 2021-07-03 DIAGNOSIS — L659 Nonscarring hair loss, unspecified: Secondary | ICD-10-CM | POA: Diagnosis present

## 2021-07-03 DIAGNOSIS — D72829 Elevated white blood cell count, unspecified: Secondary | ICD-10-CM | POA: Diagnosis not present

## 2021-07-03 DIAGNOSIS — E871 Hypo-osmolality and hyponatremia: Secondary | ICD-10-CM | POA: Diagnosis not present

## 2021-07-03 DIAGNOSIS — A419 Sepsis, unspecified organism: Principal | ICD-10-CM

## 2021-07-03 DIAGNOSIS — R579 Shock, unspecified: Secondary | ICD-10-CM

## 2021-07-03 DIAGNOSIS — J9601 Acute respiratory failure with hypoxia: Secondary | ICD-10-CM | POA: Diagnosis present

## 2021-07-03 DIAGNOSIS — R079 Chest pain, unspecified: Secondary | ICD-10-CM | POA: Diagnosis not present

## 2021-07-03 DIAGNOSIS — C3490 Malignant neoplasm of unspecified part of unspecified bronchus or lung: Secondary | ICD-10-CM | POA: Diagnosis not present

## 2021-07-03 DIAGNOSIS — R6521 Severe sepsis with septic shock: Secondary | ICD-10-CM | POA: Diagnosis present

## 2021-07-03 DIAGNOSIS — K580 Irritable bowel syndrome with diarrhea: Secondary | ICD-10-CM | POA: Diagnosis present

## 2021-07-03 DIAGNOSIS — J849 Interstitial pulmonary disease, unspecified: Secondary | ICD-10-CM | POA: Diagnosis not present

## 2021-07-03 DIAGNOSIS — D638 Anemia in other chronic diseases classified elsewhere: Secondary | ICD-10-CM

## 2021-07-03 DIAGNOSIS — J432 Centrilobular emphysema: Secondary | ICD-10-CM | POA: Diagnosis present

## 2021-07-03 DIAGNOSIS — Z87891 Personal history of nicotine dependence: Secondary | ICD-10-CM

## 2021-07-03 DIAGNOSIS — C3432 Malignant neoplasm of lower lobe, left bronchus or lung: Secondary | ICD-10-CM | POA: Diagnosis not present

## 2021-07-03 DIAGNOSIS — U071 COVID-19: Secondary | ICD-10-CM | POA: Diagnosis not present

## 2021-07-03 DIAGNOSIS — I2510 Atherosclerotic heart disease of native coronary artery without angina pectoris: Secondary | ICD-10-CM | POA: Diagnosis not present

## 2021-07-03 DIAGNOSIS — J9811 Atelectasis: Secondary | ICD-10-CM | POA: Diagnosis not present

## 2021-07-03 DIAGNOSIS — Z20822 Contact with and (suspected) exposure to covid-19: Secondary | ICD-10-CM | POA: Diagnosis present

## 2021-07-03 DIAGNOSIS — D849 Immunodeficiency, unspecified: Secondary | ICD-10-CM | POA: Diagnosis present

## 2021-07-03 DIAGNOSIS — R9389 Abnormal findings on diagnostic imaging of other specified body structures: Secondary | ICD-10-CM

## 2021-07-03 DIAGNOSIS — F32A Depression, unspecified: Secondary | ICD-10-CM | POA: Diagnosis present

## 2021-07-03 DIAGNOSIS — Z8249 Family history of ischemic heart disease and other diseases of the circulatory system: Secondary | ICD-10-CM | POA: Diagnosis not present

## 2021-07-03 DIAGNOSIS — Z66 Do not resuscitate: Secondary | ICD-10-CM | POA: Diagnosis not present

## 2021-07-03 DIAGNOSIS — R0902 Hypoxemia: Secondary | ICD-10-CM | POA: Diagnosis not present

## 2021-07-03 DIAGNOSIS — J189 Pneumonia, unspecified organism: Secondary | ICD-10-CM | POA: Diagnosis not present

## 2021-07-03 DIAGNOSIS — R64 Cachexia: Secondary | ICD-10-CM | POA: Diagnosis present

## 2021-07-03 DIAGNOSIS — R509 Fever, unspecified: Secondary | ICD-10-CM | POA: Diagnosis not present

## 2021-07-03 DIAGNOSIS — D709 Neutropenia, unspecified: Secondary | ICD-10-CM | POA: Diagnosis not present

## 2021-07-03 DIAGNOSIS — J811 Chronic pulmonary edema: Secondary | ICD-10-CM | POA: Diagnosis not present

## 2021-07-03 DIAGNOSIS — K5900 Constipation, unspecified: Secondary | ICD-10-CM | POA: Diagnosis not present

## 2021-07-03 DIAGNOSIS — F419 Anxiety disorder, unspecified: Secondary | ICD-10-CM | POA: Diagnosis present

## 2021-07-03 DIAGNOSIS — E440 Moderate protein-calorie malnutrition: Secondary | ICD-10-CM | POA: Diagnosis present

## 2021-07-03 DIAGNOSIS — J90 Pleural effusion, not elsewhere classified: Secondary | ICD-10-CM | POA: Diagnosis not present

## 2021-07-03 DIAGNOSIS — E876 Hypokalemia: Secondary | ICD-10-CM | POA: Diagnosis present

## 2021-07-03 DIAGNOSIS — E872 Acidosis, unspecified: Secondary | ICD-10-CM | POA: Diagnosis not present

## 2021-07-03 DIAGNOSIS — R918 Other nonspecific abnormal finding of lung field: Secondary | ICD-10-CM | POA: Diagnosis not present

## 2021-07-03 DIAGNOSIS — J8410 Pulmonary fibrosis, unspecified: Secondary | ICD-10-CM | POA: Diagnosis not present

## 2021-07-03 DIAGNOSIS — R531 Weakness: Secondary | ICD-10-CM | POA: Diagnosis not present

## 2021-07-03 DIAGNOSIS — J84112 Idiopathic pulmonary fibrosis: Secondary | ICD-10-CM | POA: Diagnosis not present

## 2021-07-03 DIAGNOSIS — R0602 Shortness of breath: Secondary | ICD-10-CM | POA: Diagnosis not present

## 2021-07-03 DIAGNOSIS — Z681 Body mass index (BMI) 19 or less, adult: Secondary | ICD-10-CM | POA: Diagnosis not present

## 2021-07-03 DIAGNOSIS — I959 Hypotension, unspecified: Secondary | ICD-10-CM

## 2021-07-03 DIAGNOSIS — J159 Unspecified bacterial pneumonia: Secondary | ICD-10-CM | POA: Diagnosis present

## 2021-07-03 LAB — PROTIME-INR
INR: 1 (ref 0.8–1.2)
Prothrombin Time: 13.2 seconds (ref 11.4–15.2)

## 2021-07-03 LAB — CBC
HCT: 36.6 % (ref 36.0–46.0)
Hemoglobin: 11.6 g/dL — ABNORMAL LOW (ref 12.0–15.0)
MCH: 28.8 pg (ref 26.0–34.0)
MCHC: 31.7 g/dL (ref 30.0–36.0)
MCV: 90.8 fL (ref 80.0–100.0)
Platelets: 257 10*3/uL (ref 150–400)
RBC: 4.03 MIL/uL (ref 3.87–5.11)
RDW: 17.5 % — ABNORMAL HIGH (ref 11.5–15.5)
WBC: 21.7 10*3/uL — ABNORMAL HIGH (ref 4.0–10.5)
nRBC: 0 % (ref 0.0–0.2)

## 2021-07-03 LAB — CBC WITH DIFFERENTIAL/PLATELET
Abs Immature Granulocytes: 0.05 10*3/uL (ref 0.00–0.07)
Basophils Absolute: 0 10*3/uL (ref 0.0–0.1)
Basophils Relative: 0 %
Eosinophils Absolute: 0 10*3/uL (ref 0.0–0.5)
Eosinophils Relative: 0 %
HCT: 33.3 % — ABNORMAL LOW (ref 36.0–46.0)
Hemoglobin: 10.4 g/dL — ABNORMAL LOW (ref 12.0–15.0)
Immature Granulocytes: 0 %
Lymphocytes Relative: 1 %
Lymphs Abs: 0.1 10*3/uL — ABNORMAL LOW (ref 0.7–4.0)
MCH: 29.1 pg (ref 26.0–34.0)
MCHC: 31.2 g/dL (ref 30.0–36.0)
MCV: 93 fL (ref 80.0–100.0)
Monocytes Absolute: 0.3 10*3/uL (ref 0.1–1.0)
Monocytes Relative: 2 %
Neutro Abs: 11.8 10*3/uL — ABNORMAL HIGH (ref 1.7–7.7)
Neutrophils Relative %: 97 %
Platelets: 202 10*3/uL (ref 150–400)
RBC: 3.58 MIL/uL — ABNORMAL LOW (ref 3.87–5.11)
RDW: 17.4 % — ABNORMAL HIGH (ref 11.5–15.5)
WBC: 12.2 10*3/uL — ABNORMAL HIGH (ref 4.0–10.5)
nRBC: 0 % (ref 0.0–0.2)

## 2021-07-03 LAB — RESP PANEL BY RT-PCR (FLU A&B, COVID) ARPGX2
Influenza A by PCR: NEGATIVE
Influenza B by PCR: NEGATIVE
SARS Coronavirus 2 by RT PCR: NEGATIVE

## 2021-07-03 LAB — CREATININE, SERUM
Creatinine, Ser: 1.22 mg/dL — ABNORMAL HIGH (ref 0.44–1.00)
GFR, Estimated: 47 mL/min — ABNORMAL LOW (ref >60)

## 2021-07-03 LAB — BLOOD GAS, ARTERIAL
Acid-base deficit: 5.6 mmol/L — ABNORMAL HIGH (ref 0.0–2.0)
Bicarbonate: 18.6 mmol/L — ABNORMAL LOW (ref 20.0–28.0)
FIO2: 100
O2 Content: 15 L/min
O2 Saturation: 99.8 %
Patient temperature: 98.8
pCO2 arterial: 34.1 mmHg (ref 32.0–48.0)
pH, Arterial: 7.357 (ref 7.350–7.450)
pO2, Arterial: 193 mmHg — ABNORMAL HIGH (ref 83.0–108.0)

## 2021-07-03 LAB — LACTIC ACID, PLASMA
Lactic Acid, Venous: 4.2 mmol/L (ref 0.5–1.9)
Lactic Acid, Venous: 7.5 mmol/L (ref 0.5–1.9)
Lactic Acid, Venous: 5.6 mmol/L (ref 0.5–1.9)

## 2021-07-03 LAB — COMPREHENSIVE METABOLIC PANEL
ALT: 47 U/L — ABNORMAL HIGH (ref 0–44)
AST: 106 U/L — ABNORMAL HIGH (ref 15–41)
Albumin: 3.2 g/dL — ABNORMAL LOW (ref 3.5–5.0)
Alkaline Phosphatase: 62 U/L (ref 38–126)
Anion gap: 9 (ref 5–15)
BUN: 20 mg/dL (ref 8–23)
CO2: 20 mmol/L — ABNORMAL LOW (ref 22–32)
Calcium: 8.2 mg/dL — ABNORMAL LOW (ref 8.9–10.3)
Chloride: 102 mmol/L (ref 98–111)
Creatinine, Ser: 0.94 mg/dL (ref 0.44–1.00)
GFR, Estimated: >60 mL/min (ref >60)
Glucose, Bld: 104 mg/dL — ABNORMAL HIGH (ref 70–99)
Potassium: 3.4 mmol/L — ABNORMAL LOW (ref 3.5–5.1)
Sodium: 131 mmol/L — ABNORMAL LOW (ref 135–145)
Total Bilirubin: 0.5 mg/dL (ref 0.3–1.2)
Total Protein: 6.4 g/dL — ABNORMAL LOW (ref 6.5–8.1)

## 2021-07-03 LAB — APTT: aPTT: 28 seconds (ref 24–36)

## 2021-07-03 LAB — PROCALCITONIN: Procalcitonin: 28.16 ng/mL

## 2021-07-03 LAB — CORTISOL: Cortisol, Plasma: 29.5 ug/dL

## 2021-07-03 LAB — T4: T4, Total: 7 ug/dL (ref 4.5–12.0)

## 2021-07-03 LAB — BRAIN NATRIURETIC PEPTIDE: B Natriuretic Peptide: 73.4 pg/mL (ref 0.0–100.0)

## 2021-07-03 LAB — TROPONIN I (HIGH SENSITIVITY)
Troponin I (High Sensitivity): 23 ng/L — ABNORMAL HIGH (ref <18)
Troponin I (High Sensitivity): 25 ng/L — ABNORMAL HIGH (ref <18)

## 2021-07-03 LAB — MRSA NEXT GEN BY PCR, NASAL: MRSA by PCR Next Gen: NOT DETECTED

## 2021-07-03 MED ORDER — KETAMINE HCL 50 MG/5ML IJ SOSY
PREFILLED_SYRINGE | INTRAMUSCULAR | Status: AC
  Filled 2021-07-03: qty 5

## 2021-07-03 MED ORDER — METHYLPREDNISOLONE SODIUM SUCC 125 MG IJ SOLR
125.0000 mg | Freq: Two times a day (BID) | INTRAMUSCULAR | Status: DC
  Administered 2021-07-03: 08:00:00 125 mg via INTRAVENOUS
  Filled 2021-07-03: qty 2

## 2021-07-03 MED ORDER — ALPRAZOLAM 0.5 MG PO TABS
0.5000 mg | ORAL_TABLET | ORAL | Status: DC
Start: 2021-07-03 — End: 2021-07-03

## 2021-07-03 MED ORDER — IPRATROPIUM-ALBUTEROL 0.5-2.5 (3) MG/3ML IN SOLN
3.0000 mL | Freq: Four times a day (QID) | RESPIRATORY_TRACT | Status: DC
  Administered 2021-07-03: 09:00:00 3 mL via RESPIRATORY_TRACT
  Filled 2021-07-03: qty 3

## 2021-07-03 MED ORDER — PANCRELIPASE (LIP-PROT-AMYL) 12000-38000 UNITS PO CPEP
72000.0000 [IU] | ORAL_CAPSULE | Freq: Three times a day (TID) | ORAL | Status: DC
  Administered 2021-07-03 – 2021-07-09 (×18): 72000 [IU] via ORAL
  Filled 2021-07-03 (×9): qty 2
  Filled 2021-07-03: qty 6
  Filled 2021-07-03 (×8): qty 2
  Filled 2021-07-03: qty 6
  Filled 2021-07-03: qty 2

## 2021-07-03 MED ORDER — ADULT MULTIVITAMIN W/MINERALS CH
1.0000 | ORAL_TABLET | Freq: Every morning | ORAL | Status: DC
  Administered 2021-07-03 – 2021-07-11 (×9): 1 via ORAL
  Filled 2021-07-03 (×9): qty 1

## 2021-07-03 MED ORDER — TRAMADOL HCL 50 MG PO TABS: 50.0000 mg | ORAL_TABLET | Freq: Four times a day (QID) | ORAL | Status: DC | PRN

## 2021-07-03 MED ORDER — ONDANSETRON HCL 4 MG/2ML IJ SOLN
4.0000 mg | Freq: Four times a day (QID) | INTRAMUSCULAR | Status: AC | PRN
  Administered 2021-07-03 – 2021-07-09 (×3): 4 mg via INTRAVENOUS
  Filled 2021-07-03 (×3): qty 2

## 2021-07-03 MED ORDER — SODIUM CHLORIDE 0.9 % IV SOLN
500.0000 mg | Freq: Once | INTRAVENOUS | Status: AC
  Administered 2021-07-03: 05:00:00 500 mg via INTRAVENOUS
  Filled 2021-07-03: qty 5

## 2021-07-03 MED ORDER — SODIUM CHLORIDE 0.9 % IV SOLN
500.0000 mg | INTRAVENOUS | Status: DC
  Administered 2021-07-04: 06:00:00 500 mg via INTRAVENOUS
  Filled 2021-07-03: qty 5

## 2021-07-03 MED ORDER — ETOMIDATE 2 MG/ML IV SOLN
INTRAVENOUS | Status: AC
  Filled 2021-07-03: qty 20

## 2021-07-03 MED ORDER — CHLORHEXIDINE GLUCONATE CLOTH 2 % EX PADS
6.0000 | MEDICATED_PAD | Freq: Every day | CUTANEOUS | Status: DC
  Administered 2021-07-03 – 2021-07-07 (×5): 6 via TOPICAL

## 2021-07-03 MED ORDER — CHLORHEXIDINE GLUCONATE 0.12 % MT SOLN
15.0000 mL | Freq: Two times a day (BID) | OROMUCOSAL | Status: DC
  Administered 2021-07-03 – 2021-07-05 (×6): 15 mL via OROMUCOSAL
  Filled 2021-07-03 (×6): qty 15

## 2021-07-03 MED ORDER — LAMOTRIGINE 100 MG PO TABS
100.0000 mg | ORAL_TABLET | Freq: Every morning | ORAL | Status: DC
  Administered 2021-07-03 – 2021-07-11 (×9): 100 mg via ORAL
  Filled 2021-07-03 (×9): qty 1

## 2021-07-03 MED ORDER — IPRATROPIUM-ALBUTEROL 0.5-2.5 (3) MG/3ML IN SOLN
3.0000 mL | Freq: Two times a day (BID) | RESPIRATORY_TRACT | Status: DC
  Administered 2021-07-03 – 2021-07-05 (×4): 3 mL via RESPIRATORY_TRACT
  Filled 2021-07-03 (×4): qty 3

## 2021-07-03 MED ORDER — DIPHENHYDRAMINE HCL 50 MG/ML IJ SOLN: 25.0000 mg | Freq: Once | INTRAMUSCULAR | Status: DC

## 2021-07-03 MED ORDER — SUCCINYLCHOLINE CHLORIDE 200 MG/10ML IV SOSY
PREFILLED_SYRINGE | INTRAVENOUS | Status: AC
  Filled 2021-07-03: qty 10

## 2021-07-03 MED ORDER — SODIUM CHLORIDE 0.9 % IV SOLN
2.0000 g | Freq: Two times a day (BID) | INTRAVENOUS | Status: AC
  Administered 2021-07-03 – 2021-07-09 (×14): 2 g via INTRAVENOUS
  Filled 2021-07-03 (×15): qty 2

## 2021-07-03 MED ORDER — LACTATED RINGERS IV BOLUS (SEPSIS)
1000.0000 mL | Freq: Once | INTRAVENOUS | Status: AC
  Administered 2021-07-03: 1000 mL via INTRAVENOUS

## 2021-07-03 MED ORDER — POTASSIUM CHLORIDE CRYS ER 20 MEQ PO TBCR
40.0000 meq | EXTENDED_RELEASE_TABLET | Freq: Once | ORAL | Status: AC
  Administered 2021-07-03: 15:00:00 40 meq via ORAL
  Filled 2021-07-03: qty 2

## 2021-07-03 MED ORDER — POLYETHYLENE GLYCOL 3350 17 G PO PACK: 17.0000 g | PACK | Freq: Every day | ORAL | Status: DC | PRN

## 2021-07-03 MED ORDER — ACETAMINOPHEN 325 MG PO TABS
650.0000 mg | ORAL_TABLET | ORAL | Status: DC | PRN
  Administered 2021-07-04 – 2021-07-05 (×2): 650 mg via ORAL
  Filled 2021-07-03 (×2): qty 2

## 2021-07-03 MED ORDER — MIDAZOLAM HCL 2 MG/2ML IJ SOLN
INTRAMUSCULAR | Status: AC
  Filled 2021-07-03: qty 2

## 2021-07-03 MED ORDER — ALPRAZOLAM 0.5 MG PO TABS
0.5000 mg | ORAL_TABLET | Freq: Three times a day (TID) | ORAL | Status: DC
Start: 2021-07-03 — End: 2021-07-04
  Administered 2021-07-03 (×2): 0.5 mg via ORAL
  Filled 2021-07-03 (×2): qty 1

## 2021-07-03 MED ORDER — POTASSIUM CHLORIDE IN NACL 20-0.9 MEQ/L-% IV SOLN
INTRAVENOUS | Status: DC
  Filled 2021-07-03: qty 1000

## 2021-07-03 MED ORDER — HYDROCODONE-ACETAMINOPHEN 5-325 MG PO TABS
1.0000 | ORAL_TABLET | ORAL | Status: DC | PRN
  Administered 2021-07-03: 12:00:00 1 via ORAL
  Filled 2021-07-03: qty 1

## 2021-07-03 MED ORDER — LACTATED RINGERS IV BOLUS (SEPSIS)
500.0000 mL | Freq: Once | INTRAVENOUS | Status: AC
  Administered 2021-07-03: 01:00:00 500 mL via INTRAVENOUS

## 2021-07-03 MED ORDER — ROCURONIUM BROMIDE 10 MG/ML (PF) SYRINGE
PREFILLED_SYRINGE | INTRAVENOUS | Status: AC
  Filled 2021-07-03: qty 10

## 2021-07-03 MED ORDER — NOREPINEPHRINE 4 MG/250ML-% IV SOLN
0.0000 ug/min | INTRAVENOUS | Status: DC
Start: 2021-07-03 — End: 2021-07-03
  Administered 2021-07-03: 05:00:00 2 ug/min via INTRAVENOUS
  Administered 2021-07-03: 05:00:00 7 ug/min via INTRAVENOUS
  Filled 2021-07-03: qty 250

## 2021-07-03 MED ORDER — NOREPINEPHRINE 4 MG/250ML-% IV SOLN
5.0000 ug/min | INTRAVENOUS | Status: DC
  Administered 2021-07-03: 09:00:00 45 ug/min via INTRAVENOUS
  Administered 2021-07-03: 07:00:00 40 ug/min via INTRAVENOUS
  Administered 2021-07-03: 14:00:00 38 ug/min via INTRAVENOUS
  Administered 2021-07-03: 12:00:00 40 ug/min via INTRAVENOUS
  Administered 2021-07-03: 11:00:00 44 ug/min via INTRAVENOUS
  Filled 2021-07-03 (×5): qty 250

## 2021-07-03 MED ORDER — ACETAMINOPHEN 500 MG PO TABS
1000.0000 mg | ORAL_TABLET | Freq: Once | ORAL | Status: AC
  Administered 2021-07-03: 05:00:00 1000 mg via ORAL
  Filled 2021-07-03: qty 2

## 2021-07-03 MED ORDER — ALBUTEROL SULFATE (2.5 MG/3ML) 0.083% IN NEBU: 2.5000 mg | INHALATION_SOLUTION | RESPIRATORY_TRACT | Status: DC | PRN

## 2021-07-03 MED ORDER — NOREPINEPHRINE 16 MG/250ML-% IV SOLN
5.0000 ug/min | INTRAVENOUS | Status: DC
  Administered 2021-07-03: 16:00:00 30 ug/min via INTRAVENOUS
  Administered 2021-07-04: 20:00:00 15 ug/min via INTRAVENOUS
  Administered 2021-07-04: 03:00:00 21 ug/min via INTRAVENOUS
  Administered 2021-07-05: 11:00:00 2 ug/min via INTRAVENOUS
  Filled 2021-07-03 (×4): qty 250

## 2021-07-03 MED ORDER — DOCUSATE SODIUM 100 MG PO CAPS: 100.0000 mg | ORAL_CAPSULE | Freq: Two times a day (BID) | ORAL | Status: DC | PRN

## 2021-07-03 MED ORDER — POLYVINYL ALCOHOL 1.4 % OP SOLN
1.0000 [drp] | Freq: Every evening | OPHTHALMIC | Status: DC | PRN
  Filled 2021-07-03: qty 15

## 2021-07-03 MED ORDER — ALPRAZOLAM 1 MG PO TABS
1.0000 mg | ORAL_TABLET | ORAL | Status: DC
  Administered 2021-07-04: 06:00:00 1 mg via ORAL
  Filled 2021-07-03: qty 1

## 2021-07-03 MED ORDER — HEPARIN SODIUM (PORCINE) 5000 UNIT/ML IJ SOLN
5000.0000 [IU] | Freq: Three times a day (TID) | INTRAMUSCULAR | Status: DC
  Administered 2021-07-03 – 2021-07-07 (×12): 5000 [IU] via SUBCUTANEOUS
  Filled 2021-07-03 (×12): qty 1

## 2021-07-03 MED ORDER — ORAL CARE MOUTH RINSE
15.0000 mL | Freq: Two times a day (BID) | OROMUCOSAL | Status: DC
  Administered 2021-07-03 – 2021-07-11 (×14): 15 mL via OROMUCOSAL

## 2021-07-03 MED ORDER — SODIUM CHLORIDE 0.9 % IV SOLN
2.0000 g | Freq: Once | INTRAVENOUS | Status: AC
  Administered 2021-07-03: 01:00:00 2 g via INTRAVENOUS
  Filled 2021-07-03: qty 2

## 2021-07-03 MED ORDER — FENTANYL CITRATE PF 50 MCG/ML IJ SOSY
PREFILLED_SYRINGE | INTRAMUSCULAR | Status: AC
  Filled 2021-07-03: qty 2

## 2021-07-03 MED ORDER — PANCRELIPASE (LIP-PROT-AMYL) 12000-38000 UNITS PO CPEP
36000.0000 [IU] | ORAL_CAPSULE | Freq: Two times a day (BID) | ORAL | Status: DC | PRN
  Administered 2021-07-04: 36000 [IU] via ORAL
  Filled 2021-07-03 (×2): qty 1

## 2021-07-03 MED ORDER — SODIUM CHLORIDE 0.9 % IV BOLUS
500.0000 mL | Freq: Once | INTRAVENOUS | Status: AC
  Administered 2021-07-03: 05:00:00 500 mL via INTRAVENOUS

## 2021-07-03 MED ORDER — LORAZEPAM 2 MG/ML IJ SOLN
0.5000 mg | Freq: Once | INTRAMUSCULAR | Status: AC
  Administered 2021-07-03: 03:00:00 0.5 mg via INTRAVENOUS
  Filled 2021-07-03: qty 1

## 2021-07-03 MED ORDER — PANTOPRAZOLE SODIUM 40 MG IV SOLR
40.0000 mg | Freq: Every day | INTRAVENOUS | Status: DC
  Administered 2021-07-03: 21:00:00 40 mg via INTRAVENOUS
  Filled 2021-07-03: qty 40

## 2021-07-03 MED ORDER — SODIUM CHLORIDE 0.9 % IV SOLN
250.0000 mL | INTRAVENOUS | Status: DC
  Administered 2021-07-03 – 2021-07-10 (×7): 250 mL via INTRAVENOUS

## 2021-07-03 MED ORDER — VALACYCLOVIR HCL 500 MG PO TABS
500.0000 mg | ORAL_TABLET | Freq: Every morning | ORAL | Status: DC
  Administered 2021-07-03 – 2021-07-11 (×9): 500 mg via ORAL
  Filled 2021-07-03 (×9): qty 1

## 2021-07-03 NOTE — Sepsis Progress Note (Signed)
Following for sepsis monitoring ?

## 2021-07-03 NOTE — Progress Notes (Signed)
PCCM Interval Progress Note  Pt admitted early AM 12/28 by our overnight team.  Briefly, this is a 75 y.o. 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 Marin Olp. She is currently undergoing chemotherapy and just received her 4th treatment on 12/27. 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  Clinically, this appears to favor septic shock 2/2 PNA.   S:  BP improved on 37 Levo. Eating lunch, feels much better after eating. Still feel warm/febrile.   O: VITAL SIGNS: BP (!) 83/43    Pulse (!) 102    Temp 98.2 F (36.8 C) (Axillary)    Resp (!) 27    Ht 5\' 2"  (1.575 m)    Wt 48.5 kg    SpO2 97%    BMI 19.57 kg/m   PHYSICAL EXAM: General:  Adult female, chronically ill appearing, in NAD. Neuro:  A&O x 3, no deficits. Cardiovascular:  RRR, no M/R/G. Lungs:  Faint rhonchi.  Normal effort. Abdomen:  BS x 4, S/NT/ND.  Musculoskeletal:  No edema. Skin: Warm, dry.  A / P:  Acute hypoxic respiratory failure - PNA (very likely) vs chemotherapy pneumonitis but favor PNA given PCT 28 (though this has to be interpreted carefully given her known malignancy and immunocompromised state). - Continue supplemental O2 as needed to maintain SpO2 > 92%. - Continue empiric Cefepime, Azithromycin. - Follow cultures. - Consider steroids but defer for now as favor infection vs pneumonitis. - Consider CT chest. - Follow CXR.  Shock - favor septic 2/2 PNA.  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 - Continue Levophed for goal MAP > 65. - Get echo. - Supportive care.  Small Cell Lung CA left lung - on chemo, followed by Dr. Marin Olp. - Per Dr. Marin Olp.  Goals of care: DNR confirmed with pt. Continue all supportive care  otherwise.   Rest per H&P from earlier today.   Additional CC time:  25 min.   Montey Hora, [facility] 07/03/2021, 1:18 PM

## 2021-07-03 NOTE — ED Notes (Signed)
Pt in bed, pt flushed, family sitting at bedside, pt is currently on NRB satting high 90s-100%, pt on cardiac monitor in sinus tach, pt awake and answering questions appropriately.

## 2021-07-03 NOTE — Consult Note (Signed)
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  HPI: Ms. Molly Welch is well-known to me.  She is a very nice 75 year old white female.  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  She is on chemo immunotherapy.  She has had 4 cycles of treatment so far.  She had her fourth cycle yesterday in the office.  She actually looked pretty good.  Her weight was up.  She was walking a little bit better.  We treated her with carboplatinum/Taxol/Yervoy/Opdivo.  Again she tolerated this well.  She has had a response to date.  When she got home, she apparently began to have problems.  She began to have some weakness and some difficulty breathing.  She had a temperature of 103.7.  Thankfully, her daughter who is with her, knew something was wrong.  She got to her to the emergency room.  A chest x-ray was done which showed bilateral infiltrates.  Her lactic acid is 5.6.  She was started on norepinephrine for hypotension.  She is on a nonrebreather mask right now.  Cultures have been taken.  She has been given some antibiotics.  She looks little bit better than I would have thought.  However, it sound like she really had a rough night.  She is had no hemoptysis.  There is been no cough.  She has had no diarrhea.  She has been seen by I think critical care.  She is going to the ICU.  Past Medical History:  Diagnosis Date   Abnormal chest x-ray 07/08/2003   COPD changes (2005) + interstitial lung dz changes (2002)   Anxiety and depression    Prozac, celexa, paxil, wellbutrin, and buspar all failures.  Pt has stated xanax is only med that helps.   Depression    Goals of care, counseling/discussion 04/19/2021   Lung cancer, lower lobe ([place]) 04/19/2021   Tobacco dependence   :   Past Surgical History:  Procedure Laterality Date    ABDOMINAL HYSTERECTOMY  1979   APPENDECTOMY  1966   CATARACT EXTRACTION, BILATERAL     IR IMAGING GUIDED PORT INSERTION  04/25/2021  :   Current Facility-Administered Medications:    0.9 %  sodium chloride infusion, 250 mL, Intravenous, Continuous, Carson Myrtle, Seong-Joo, MD   0.9 % NaCl with KCl 20 mEq/ L  infusion, , Intravenous, Continuous, Renee Pain, MD   acetaminophen (TYLENOL) tablet 650 mg, 650 mg, Oral, Q4H PRN, Renee Pain, MD   albuterol (PROVENTIL) (2.5 MG/3ML) 0.083% nebulizer solution 2.5 mg, 2.5 mg, Nebulization, Q2H PRN, Renee Pain, MD   ALPRAZolam Duanne Moron) tablet 0.5-1 mg, 0.5-1 mg, Oral, See admin instructions, Renee Pain, MD   docusate sodium (COLACE) capsule 100 mg, 100 mg, Oral, BID PRN, Renee Pain, MD   etomidate (AMIDATE) 2 MG/ML injection, , , ,    heparin injection 5,000 Units, 5,000 Units, Subcutaneous, Q8H, Renee Pain, MD   HYDROcodone-acetaminophen (NORCO/VICODIN) 5-325 MG per tablet 1 tablet, 1 tablet, Oral, Q4H PRN, Renee Pain, MD   hypromellose (GENTEAL) 0.3 % ophthalmic ointment 1 application, 1 application, Both Eyes, QHS PRN, Renee Pain, MD   ipratropium-albuterol (DUONEB) 0.5-2.5 (3) MG/3ML nebulizer solution 3 mL, 3 mL, Nebulization, Q6H, Renee Pain, MD   lamoTRIgine (LAMICTAL) tablet 100 mg, 100 mg, Oral, q morning, Renee Pain, MD  lipase/protease/amylase (CREON) capsule 36,000 Units, 36,000 Units, Oral, See admin instructions, Renee Pain, MD   multivitamin with minerals tablet 1 tablet, 1 tablet, Oral, q morning, Renee Pain, MD   norepinephrine (LEVOPHED) 4mg  in 272mL (0.016 mg/mL) premix infusion, 5-50 mcg/min, Intravenous, Titrated, Renee Pain, MD, Last Rate: 150 mL/hr at 07/03/21 0659, 40 mcg/min at 07/03/21 0659   pantoprazole (PROTONIX) injection 40 mg, 40 mg, Intravenous, QHS, Renee Pain, MD   polyethylene glycol (MIRALAX / GLYCOLAX) packet 17 g, 17 g, Oral, Daily PRN,  Renee Pain, MD   rocuronium bromide 100 MG/10ML SOSY, , , ,    traMADol (ULTRAM) tablet 50 mg, 50 mg, Oral, Q6H PRN, Renee Pain, MD   valACYclovir (VALTREX) tablet 500 mg, 500 mg, Oral, q morning, Renee Pain, MD  Current Outpatient Medications:    acetaminophen (TYLENOL) 500 MG tablet, Take 500 mg by mouth daily as needed for headache (pain)., Disp: , Rfl:    ALPRAZolam (XANAX) 1 MG tablet, Take 0.5-1 mg by mouth See admin instructions. Take one tablet (1 mg) by mouth at 5:30am, take 1/2 tablet (0.5 mg) at 7:30am, 3pm and at bedtime., Disp: , Rfl:    Ascorbic Acid (VITAMIN C) 1000 MG tablet, Take 1,000 mg by mouth every morning., Disp: , Rfl:    azelastine (OPTIVAR) 0.05 % ophthalmic solution, Place 1 drop into both eyes 2 (two) times daily as needed (dry eyes/itching)., Disp: , Rfl:    diclofenac Sodium (VOLTAREN) 1 % GEL, Apply 1 application topically at bedtime., Disp: , Rfl:    HYDROcodone-acetaminophen (NORCO/VICODIN) 5-325 MG tablet, Take 1 tablet by mouth every 4 (four) hours as needed for moderate pain., Disp: 60 tablet, Rfl: 0   hypromellose (SYSTANE OVERNIGHT THERAPY) 0.3 % GEL ophthalmic ointment, Place 1 application into both eyes at bedtime as needed for dry eyes., Disp: , Rfl:    lamoTRIgine (LAMICTAL) 25 MG tablet, Take 100 mg by mouth every morning., Disp: , Rfl:    lidocaine-prilocaine (EMLA) cream, Apply to affected area once, Disp: 30 g, Rfl: 3   lipase/protease/amylase (CREON) 36000 UNITS CPEP capsule, Take 36,000 Units by mouth See admin instructions. Take two capsules (72000 units) by mouth three times daily with meals and one capsule (36000 units) with snacks, Disp: , Rfl:    ondansetron (ZOFRAN) 8 MG tablet, Take 1 tablet (8 mg total) by mouth 2 (two) times daily as needed for refractory nausea / vomiting. Start on day 3 after carboplatin chemo., Disp: 30 tablet, Rfl: 1   Probiotic Product (PROBIOTIC PO), Take 1 tablet by mouth every morning., Disp: , Rfl:     prochlorperazine (COMPAZINE) 10 MG tablet, Take 1 tablet (10 mg total) by mouth every 6 (six) hours as needed (Nausea or vomiting)., Disp: 30 tablet, Rfl: 1   traMADol (ULTRAM) 50 MG tablet, Take 1 tablet (50 mg total) by mouth every 6 (six) hours as needed., Disp: 60 tablet, Rfl: 0   TURMERIC PO, Take 1 capsule by mouth daily., Disp: , Rfl:    valACYclovir (VALTREX) 500 MG tablet, Take 500 mg by mouth every morning., Disp: , Rfl:    AMBULATORY NON FORMULARY MEDICATION, Lift Chair Diagnosis, Disp: 1 Device, Rfl: 0   AMBULATORY NON FORMULARY MEDICATION, Rollator Diagnosis G 62.89, Disp: 1 Device, Rfl: 0   dexamethasone (DECADRON) 4 MG tablet, Take 2 tablets (8 mg total) by mouth daily. Start the day after carboplatin chemotherapy for 3 days. (Patient not taking: Reported on 07/03/2021), Disp: 30 tablet, Rfl: 1  Multiple Vitamin (MULTIVITAMIN WITH MINERALS) TABS tablet, Take 1 tablet by mouth every morning. Centrum, Disp: , Rfl: :   ALPRAZolam  0.5-1 mg Oral See admin instructions   etomidate       heparin  5,000 Units Subcutaneous Q8H   ipratropium-albuterol  3 mL Nebulization Q6H   lamoTRIgine  100 mg Oral q morning   lipase/protease/amylase  36,000 Units Oral See admin instructions   multivitamin with minerals  1 tablet Oral q morning   pantoprazole (PROTONIX) IV  40 mg Intravenous QHS   rocuronium bromide       valACYclovir  500 mg Oral q morning  :   Allergies  Allergen Reactions   Doxycycline Swelling   Prednisone Other (See Comments)    Very emotional, crying, angry   Penicillin G Other (See Comments)   Sulfa Antibiotics Other (See Comments)   Penicillins Rash    Has patient had a PCN reaction causing immediate rash, facial/tongue/throat swelling, SOB or lightheadedness with hypotension: yes Has patient had a PCN reaction causing severe rash involving mucus membranes or skin necrosis: no Has patient had a PCN reaction that required hospitalization: no Has patient had a PCN  reaction occurring within the last 10 years: no If all of the above answers are "NO", then may proceed with Cephalosporin use.    Sulfonamide Derivatives Rash  :   Family History  Problem Relation Age of Onset   Suicidality Father    Heart disease Brother    Cirrhosis Brother    Depression Son    GI Disease Son    Arthritis Son    Diabetes Other   :   Social History   Socioeconomic History   Marital status: Single    Spouse name: Not on file   Number of children: Not on file   Years of education: Not on file   Highest education level: Not on file  Occupational History   Not on file  Tobacco Use   Smoking status: Former    Packs/day: 0.50    Years: 55.00    Pack years: 27.50    Types: Cigarettes    Quit date: 03/07/2021    Years since quitting: 0.3   Smokeless tobacco: Never  Substance and Sexual Activity   Alcohol use: No   Drug use: No   Sexual activity: Not on file  Other Topics Concern   Not on file  Social History Narrative   Right handed   Lives with daughter    Works part time PT    Social Determinants of Health   Financial Resource Strain: Not on file  Food Insecurity: Not on file  Transportation Needs: Not on file  Physical Activity: Not on file  Stress: Not on file  Social Connections: Not on file  Intimate Partner Violence: Not on file  :  Review of Systems  Constitutional:  Positive for chills, fever and malaise/fatigue.  HENT: Negative.    Eyes: Negative.   Respiratory:  Positive for shortness of breath and wheezing.   Cardiovascular:  Positive for palpitations.  Gastrointestinal: Negative.   Genitourinary: Negative.   Musculoskeletal: Negative.   Skin: Negative.   Neurological: Negative.   Endo/Heme/Allergies: Negative.   Psychiatric/Behavioral: Negative.       Exam:  This is a thin, somewhat ill-appearing white female.  She is on a nonrebreather mask.  Her blood pressure is 117/67.  Pulse is 107.  Oxygen saturation is 97%.  Her  lungs have decent breath sounds bilaterally  She has some wheezing.  She has some rhonchi.  Cardiac exam is tachycardic but regular.  Abdomen is soft.  Bowel sounds are present.  Extremity shows no clubbing, cyanosis or edema.  Neurological exam is nonfocal.  Patient Vitals for the past 24 hrs:  BP Temp Temp src Pulse Resp SpO2 Height Weight  07/03/21 0650 (!) 75/49 -- -- (!) 104 (!) 25 100 % -- --  07/03/21 0640 (!) 98/49 -- -- (!) 107 (!) 34 100 % -- --  07/03/21 0630 (!) 95/59 -- -- (!) 107 (!) 24 100 % -- --  07/03/21 0620 (!) 99/53 -- -- (!) 106 (!) 25 100 % -- --  07/03/21 0615 (!) 100/48 -- -- (!) 105 (!) 34 100 % -- --  07/03/21 0600 (!) 96/51 -- -- (!) 102 (!) 22 100 % -- --  07/03/21 0555 (!) 95/52 -- -- (!) 103 (!) 26 100 % -- --  07/03/21 0550 (!) 97/53 -- -- (!) 101 (!) 28 100 % -- --  07/03/21 0545 (!) 96/52 -- -- (!) 102 (!) 24 100 % -- --  07/03/21 0540 (!) 93/51 -- -- (!) 103 (!) 24 100 % -- --  07/03/21 0535 (!) 85/51 -- -- 100 (!) 28 100 % -- --  07/03/21 0530 (!) 87/57 -- -- 100 (!) 30 100 % -- --  07/03/21 0526 (!) 90/48 -- -- (!) 102 (!) 34 100 % -- --  07/03/21 0525 (!) 88/52 -- -- (!) 103 17 100 % -- --  07/03/21 0523 -- -- -- (!) 104 (!) 30 100 % -- --  07/03/21 0521 -- -- -- 100 (!) 30 100 % -- --  07/03/21 0520 (!) 88/53 -- -- 100 (!) 28 100 % -- --  07/03/21 0515 (!) 79/43 -- -- (!) 102 (!) 32 100 % -- --  07/03/21 0508 (!) 90/49 -- -- (!) 103 (!) 23 100 % -- --  07/03/21 0505 (!) 71/60 -- -- (!) 103 (!) 37 100 % -- --  07/03/21 0502 (!) 82/42 -- -- (!) 106 (!) 43 100 % -- --  07/03/21 0500 (!) 75/51 -- -- (!) 105 (!) 37 100 % -- --  07/03/21 0456 (!) 76/46 -- -- (!) 109 (!) 25 100 % -- --  07/03/21 0445 (!) 72/47 -- -- (!) 117 (!) 43 100 % -- --  07/03/21 0441 -- -- -- (!) 116 (!) 26 95 % -- --  07/03/21 0430 (!) 88/47 -- -- 97 (!) 25 91 % -- --  07/03/21 0423 (!) 91/49 -- -- (!) 118 (!) 39 100 % -- --  07/03/21 0400 101/71 -- -- -- (!) 48 -- -- --   07/03/21 0345 -- -- -- (!) 134 20 93 % -- --  07/03/21 0330 (!) 94/56 -- -- (!) 125 (!) 40 90 % -- --  07/03/21 0315 -- -- -- (!) 125 (!) 36 97 % -- --  07/03/21 0300 (!) 99/56 -- -- (!) 125 (!) 27 99 % -- --  07/03/21 0253 -- 98.8 F (37.1 C) Oral -- -- -- -- --  07/03/21 0245 -- -- -- (!) 123 (!) 29 92 % -- --  07/03/21 0235 (!) 93/56 -- -- -- (!) 29 -- -- --  07/03/21 0215 -- -- -- (!) 40 (!) 39 (!) 81 % -- --  07/03/21 0200 (!) 93/55 -- -- (!) 121 (!) 23 94 % -- --  07/03/21 0145 -- -- -- Marland Kitchen  116 (!) 50 90 % -- --  07/03/21 0130 (!) 95/54 -- -- (!) 113 (!) 31 91 % -- --  07/03/21 0115 -- -- -- (!) 36 (!) 26 93 % -- --  07/03/21 0107 (!) 89/53 -- -- (!) 101 (!) 25 93 % -- --  07/03/21 0100 (!) 89/53 -- -- -- (!) 36 -- -- --  07/03/21 0045 -- -- -- -- (!) 32 -- -- --  07/03/21 0030 (!) 77/52 -- -- (!) 101 (!) 29 93 % -- --  07/03/21 0015 -- -- -- (!) 102 (!) 29 94 % -- --  07/03/21 0000 (!) 76/51 -- -- (!) 106 (!) 21 93 % -- --  07/02/21 2358 (!) 84/54 -- -- (!) 107 (!) 33 93 % -- --  07/02/21 2356 -- -- -- -- -- -- 5\' 2"  (1.575 m) 107 lb (48.5 kg)  07/02/21 2350 (!) 76/52 98.4 F (36.9 C) Oral (!) 109 (!) 27 93 % -- --  07/02/21 2344 -- -- -- -- -- 95 % -- --     Recent Labs    07/02/21 0806 07/02/21 2343  WBC 6.6 12.2*  HGB 11.7* 10.4*  HCT 35.9* 33.3*  PLT 236 202    Recent Labs    07/02/21 0806 07/02/21 2343  NA 136 131*  K 4.1 3.4*  CL 98 102  CO2 30 20*  GLUCOSE 94 104*  BUN 18 20  CREATININE 0.66 0.94  CALCIUM 10.1 8.2*    Blood smear review: None  Pathology: None    Assessment and Plan: Ms. Henkes is well-known to me.  She is a 75 year old white female.  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  She is on chemotherapy and immunotherapy right now.  The infiltrates certainly troublesome.  1 has to worry about the possibility of pneumonitis from immunotherapy.  I would probably would get her on steroids right now.  I realize this  may counteract immunotherapy is effective but I think that we have to get her lungs better so she tries to avoid being on the ventilator.  It will be interesting to see what cultures show.  For right now, we will to see what cultures show.  We will hopefully not have to get her intubated.  I told her that I probably would recommend intubation just because she is responding.  She does not have metastatic disease.  She is potentially curable although I think this is still going to be very difficult.  I know she will get wonderful care in the ICU.  I hate that this is happened.  She looked good in the office yesterday.  I am just very surprised that she has developed this.  Lattie Haw, MD  [place] 10:23

## 2021-07-03 NOTE — ED Notes (Signed)
Pt from home, family at bedside states that she normally gets around with a walker, pt oriented to person and place, doesn't know day of the week, re oriented pt .

## 2021-07-03 NOTE — Progress Notes (Signed)
[facility] Progress Note Patient Name: SIA GABRIELSEN DOB: 1945-08-23 MRN: [id number]   Date of Service  07/03/2021  HPI/Events of Note  Patient complaining of nausea. QTC normal.  eICU Interventions  PRN iv Zofran ordered.        Kerry Kass Burnetta Kohls 07/03/2021, 8:18 PM

## 2021-07-03 NOTE — Progress Notes (Addendum)
Pharmacy Antibiotic Note  Molly Welch is a 75 y.o. female admitted on 07/02/2021 with acute hypoxemic respiratory failure.  Patient received chemotherapy on 12/27.  Abnormal chest x-ray with concerns for pneumonia vs pneumonitis from chemo. Pharmacy has been consulted for cefepime dosing.  Tm 101.4, WBC 12 >21 (on steroids for presumed pneumonitis), LA 7.5 > 5.6, currently on 8L HFNC  Plan: Continue azithromycin 500mg  IV q24 hours Begin Cefepime 2g IV q12 hours for CrCl 31 mL/min F/u culture data, clinical improvement, renal function  Height: 5\' 2"  (157.5 cm) Weight: 48.5 kg (107 lb) IBW/kg (Calculated) : 50.1  Temp (24hrs), Avg:99.6 F (37.6 C), Min:98 F (36.7 C), Max:102.8 F (39.3 C)  Recent Labs  Lab 07/02/21 0806 07/02/21 2343 07/03/21 0259 07/03/21 0618 07/03/21 0759  WBC 6.6 12.2*  --   --  21.7*  CREATININE 0.66 0.94  --   --  1.22*  LATICACIDVEN  --  4.2* 7.5* 5.6*  --     Estimated Creatinine Clearance: 31.4 mL/min (A) (by C-G formula based on SCr of 1.22 mg/dL (H)).    Allergies  Allergen Reactions   Doxycycline Swelling   Prednisone Other (See Comments)    Very emotional, crying, angry   Penicillin G Other (See Comments)   Sulfa Antibiotics Other (See Comments)   Penicillins Rash    Has patient had a PCN reaction causing immediate rash, facial/tongue/throat swelling, SOB or lightheadedness with hypotension: yes Has patient had a PCN reaction causing severe rash involving mucus membranes or skin necrosis: no Has patient had a PCN reaction that required hospitalization: no Has patient had a PCN reaction occurring within the last 10 years: no If all of the above answers are "NO", then may proceed with Cephalosporin use.    Sulfonamide Derivatives Rash    Antimicrobials this admission: Azithromycin 12/28 >>  Cefepime 12/28 >>   Dose adjustments this admission:   Microbiology results: 12/28 BCx: sent 12/28 MRSA PCR: not detected  Thank you for  allowing pharmacy to be a part of this patients care.  Dimple Nanas 07/03/2021 12:50 PM

## 2021-07-03 NOTE — Progress Notes (Signed)
Bedside nurse indicated that according to family they were informed by Dr. Marin Olp oncologist that it is okay for patient intubation to "give long time to heal ".  Therefore code changes changed to partial code that allows for intubation and BiPAP

## 2021-07-03 NOTE — H&P (Addendum)
NAME:  Molly Welch MRN:  [id number] DOB:  08/09/1945 LOS: 0 ADMISSION DATE:  07/02/2021 DATE OF SERVICE:  07/03/2021  CHIEF COMPLAINT: Weakness, malaise  HISTORY & PHYSICAL  History of Present Illness  This 75 y.o. RACE @GENDER @reformed  smoker presented to the [facility] Emergency Department via EMS with complaints of weakness and malaise after undergoing chemotherapy earlier in the day.  In the emergency department, the patient was found to be hypotensive and febrile.  She had a fever of 103 F at home.  She had increasing oxygen requirement and is currently on 100% nonrebreather.  She has been started on norepinephrine for blood pressure support.  Lactate was elevated.  She was started on cefepime/azithromycin.  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  She is currently on carboplatin/Taxol/Yervoy/Opdivo.  REVIEW OF SYSTEMS Constitutional: No weight loss, in fact, weight gain 3 lbs. No night sweats. Fever.  No chills.  Weakness, fatigue, malaise. HEENT: No headaches, dysphagia, sore throat, otalgia, nasal congestion, PND CV:  No chest pain, orthopnea, PND, swelling in lower extremities, palpitations GI:  No abdominal pain, nausea, vomiting, diarrhea, change in bowel pattern, anorexia Resp: Dyspnea at rest. No cough, mucus, hemoptysis, wheezing.  GU: no dysuria, change in color of urine, no urgency or frequency.  No flank pain. MS:  No joint pain or swelling. No myalgias,  No decreased range of motion.  Psych:  Agitation after Ativan dose administration.  No change in mood or affect. No memory loss. Skin: no rash or lesions.   Past Medical/Surgical/Social/Family History   Past Medical History:  Diagnosis Date   Abnormal chest x-ray 07/08/2003   COPD changes (2005) + interstitial lung dz changes (2002)   Anxiety and depression    Prozac, celexa, paxil, wellbutrin, and buspar all failures.  Pt  has stated xanax is only med that helps.   Depression    Goals of care, counseling/discussion 04/19/2021   Lung cancer, lower lobe ([place]) 04/19/2021   Tobacco dependence     Past Surgical History:  Procedure Laterality Date   ABDOMINAL HYSTERECTOMY  1979   APPENDECTOMY  1966   CATARACT EXTRACTION, BILATERAL     IR IMAGING GUIDED PORT INSERTION  04/25/2021    Social History   Tobacco Use   Smoking status: Former    Packs/day: 0.50    Years: 55.00    Pack years: 27.50    Types: Cigarettes    Quit date: 03/07/2021    Years since quitting: 0.3   Smokeless tobacco: Never  Substance Use Topics   Alcohol use: No    Family History  Problem Relation Age of Onset   Suicidality Father    Heart disease Brother    Cirrhosis Brother    Depression Son    GI Disease Son    Arthritis Son    Diabetes Other     Procedures:     Significant Diagnostic Tests:     Micro Data:   Results for orders placed or performed during the hospital encounter of 07/02/21  Resp Panel by RT-PCR (Flu A&B, Covid) Peripheral     Status: None   Collection Time: 07/03/21 12:03 AM   Specimen: Peripheral; Nasopharyngeal(NP) swabs in vial transport medium  Result Value Ref Range Status   SARS Coronavirus 2 by RT PCR NEGATIVE NEGATIVE Final    Comment: (NOTE) SARS-CoV-2 target nucleic acids are NOT DETECTED.  The SARS-CoV-2 RNA  is generally detectable in upper respiratory specimens during the acute phase of infection. The lowest concentration of SARS-CoV-2 viral copies this assay can detect is 138 copies/mL. A negative result does not preclude SARS-Cov-2 infection and should not be used as the sole basis for treatment or other patient management decisions. A negative result may occur with  improper specimen collection/handling, submission of specimen other than nasopharyngeal swab, presence of viral mutation(s) within the areas targeted by this assay, and inadequate number of viral copies(<138  copies/mL). A negative result must be combined with clinical observations, patient history, and epidemiological information. The expected result is Negative.  Fact Sheet for Patients:  EntrepreneurPulse.com.au  Fact Sheet for Healthcare Providers:  IncredibleEmployment.be  This test is no t yet approved or cleared by the [place] FDA and  has been authorized for detection and/or diagnosis of SARS-CoV-2 by FDA under an Emergency Use Authorization (EUA). This EUA will remain  in effect (meaning this test can be used) for the duration of the COVID-19 declaration under Section 564(b)(1) of the Act, 21 U.S.C.section 360bbb-3(b)(1), unless the authorization is terminated  or revoked sooner.       Influenza A by PCR NEGATIVE NEGATIVE Final   Influenza B by PCR NEGATIVE NEGATIVE Final    Comment: (NOTE) The Xpert Xpress SARS-CoV-2/FLU/RSV plus assay is intended as an aid in the diagnosis of influenza from Nasopharyngeal swab specimens and should not be used as a sole basis for treatment. Nasal washings and aspirates are unacceptable for Xpert Xpress SARS-CoV-2/FLU/RSV testing.  Fact Sheet for Patients: EntrepreneurPulse.com.au  Fact Sheet for Healthcare Providers: IncredibleEmployment.be  This test is not yet approved or cleared by the [place] FDA and has been authorized for detection and/or diagnosis of SARS-CoV-2 by FDA under an Emergency Use Authorization (EUA). This EUA will remain in effect (meaning this test can be used) for the duration of the COVID-19 declaration under Section 564(b)(1) of the Act, 21 U.S.C. section 360bbb-3(b)(1), unless the authorization is terminated or revoked.  Performed at [facility], [place] [address] [place], [place] [address]       Antimicrobials:  Cefepime/azithromycin (12/28)   Interim history/subjective:     Objective   BP (!) 97/53     Pulse (!) 101    Temp 98.8 F (37.1 C) (Oral)    Resp (!) 28    Ht 5' 2"  (1.575 m)    Wt 48.5 kg    SpO2 100%    BMI 19.57 kg/m     Filed Weights   07/02/21 2356  Weight: 48.5 kg    Intake/Output Summary (Last 24 hours) at 07/03/2021 0600 Last data filed at 07/03/2021 8756 Gross per 24 hour  Intake 2100 ml  Output --  Net 2100 ml    FiO2 (%):  [100 %] 100 %   Examination: GENERAL: alert, oriented to time, person and place, pleasant, well-developed. No acute distress. HEAD: Alopecia, normocephalic, atraumatic EYE: PERRLA, EOM intact, no scleral icterus, no pallor. NOSE: nares are patent. No polyps. No exudate. No sinus tenderness. THROAT/ORAL CAVITY: Normal dentition. No oral thrush. No exudate. Mucous membranes are dry. No tonsillar enlargement.  NECK: supple, no thyromegaly, no JVD, no lymphadenopathy. Trachea midline. CHEST/LUNG: symmetric in development and expansion. Good air entry.  Bilateral lower lung predominant crackles.  No wheezes. HEART: Regular S1 and S2.  The room is too loud to appreciate murmur, rub or gallop.  Borderline tachycardia. ABDOMEN: soft, nontender, nondistended. Normoactive bowel sounds. No rebound. No guarding.  No hepatosplenomegaly. EXTREMITIES: Edema: none. No cyanosis.  Digital clubbing.  2+ DP pulses LYMPHATIC: no cervical/axillary/inguinal lymph nodes appreciated MUSCULOSKELETAL: No point tenderness.  No bulk atrophy. Joints: Normal inspection.  SKIN: Generalized redness.  No rash or lesion. NEUROLOGIC: Doll's eyes intact. Corneal reflex intact. Spontaneous respirations intact. Cranial nerves II-XII are grossly symmetric and physiologic. Babinski absent. No sensory deficit. Motor: 5/5 @ RUE, 5/5 @ LUE, 5/5 @ RLL,  5/5 @ LLL.  DTR: 2+ @ R biceps, 2+ @ L biceps, 2+ @ R patellar,  2+ @ L patellar. No cerebellar signs. Gait was not assessed.   Resolved Hospital Problem list      Assessment & Plan:   ASSESSMENT/PLAN:  ASSESSMENT (included in  the Hospital Problem List)  Principal Problem:   Acute respiratory failure with hypoxemia (HCC) Active Problems:   Shock circulatory (HCC)   Lactic acidosis   Hyponatremia   Leukocytosis   Lung cancer, lower lobe (HCC)   Anemia of chronic disease   By systems: PULMONARY Acute hypoxemic respiratory failure Abnormal chest x-ray Squamous cell carcinoma, stage IIIb, left lower lobe Unclear etiology.  Clinically, with onset shortly after chemotherapy administration earlier today, sepsis needs to be ruled out.  Also, chemotherapy induced cardiomyopathy is a consideration.   Check BNP.   2D echocardiographic interrogation.   Blood cultures.   Continue empiric antibiotics (cefepime/azithromycin).   Consult oncology.   CARDIOVASCULAR Shock, unknown etiology Trend lactate. Titrate norepinephrine to maintain MAP 65. 2D echocardiographic interrogation.   RENAL Hyponatremia Check BNP.   Monitor urine output. IV hydration: NS +20 mEq KCl @ 100 mL/h.  GASTROINTESTINAL GI PROPHYLAXIS: Protonix   HEMATOLOGIC Leukocytosis Anemia of chronic disease Follow-up blood cultures DVT PROPHYLAXIS: Heparin   INFECTIOUS Possible sepsis/septic shock Follow-up blood cultures Continue empiric antibiotics (cefepime/azithromycin)  ENDOCRINE: No acute issues   NEUROLOGIC Paradoxical reaction to Ativan Takes Xanax at home without adverse reaction.  Continue Xanax.  No Ativan.   PLAN/RECOMMENDATIONS  Admit to ICU under my service (Attending: Renee Pain, MD) with the diagnoses highlighted above in the active Hospital Problem List (ASSESSMENT). NUTRITION: Regular    My assessment, plan of care, findings, medications, side effects, etc. were discussed with: nurse, patient (answered all questions to patient's satisfaction), and patient's next of kin (answered all questions to his/her satisfaction).   Best practice:  Diet: Regular Pain/Anxiety/Delirium protocol (if indicated): Not  applicable VAP protocol (if indicated): Not applicable DVT prophylaxis: Heparin GI prophylaxis: Protonix Glucose control: Not applicable Mobility/Activity: Bedrest for now   Code Status: DNR Family Communication:  patient's family (daughter at bedside) Disposition: Admit to ICU   Labs   CBC: Recent Labs  Lab 07/02/21 0806 07/02/21 2343  WBC 6.6 12.2*  NEUTROABS 3.7 11.8*  HGB 11.7* 10.4*  HCT 35.9* 33.3*  MCV 89.5 93.0  PLT 236 102    Basic Metabolic Panel: Recent Labs  Lab 07/02/21 0806 07/02/21 2343  NA 136 131*  K 4.1 3.4*  CL 98 102  CO2 30 20*  GLUCOSE 94 104*  BUN 18 20  CREATININE 0.66 0.94  CALCIUM 10.1 8.2*   GFR: Estimated Creatinine Clearance: 40.8 mL/min (by C-G formula based on SCr of 0.94 mg/dL). Recent Labs  Lab 07/02/21 0806 07/02/21 2343 07/03/21 0259  WBC 6.6 12.2*  --   LATICACIDVEN  --  4.2* 7.5*    Liver Function Tests: Recent Labs  Lab 07/02/21 0806 07/02/21 2343  AST 18 106*  ALT 11 47*  ALKPHOS 72 62  BILITOT  0.3 0.5  PROT 7.4 6.4*  ALBUMIN 4.0 3.2*   No results for input(s): LIPASE, AMYLASE in the last 168 hours. No results for input(s): AMMONIA in the last 168 hours.  ABG    Component Value Date/Time   PHART 7.357 07/03/2021 0522   PCO2ART 34.1 07/03/2021 0522   PO2ART 193 (H) 07/03/2021 0522   HCO3 18.6 (L) 07/03/2021 0522   ACIDBASEDEF 5.6 (H) 07/03/2021 0522   O2SAT 99.8 07/03/2021 0522     Coagulation Profile: Recent Labs  Lab 07/02/21 2343  INR 1.0    Cardiac Enzymes: No results for input(s): CKTOTAL, CKMB, CKMBINDEX, TROPONINI in the last 168 hours.  HbA1C: Hgb A1c MFr Bld  Date/Time Value Ref Range Status  11/24/2019 03:27 PM 5.5 4.8 - 5.6 % Final    Comment:             Prediabetes: 5.7 - 6.4          Diabetes: >6.4          Glycemic control for adults with diabetes: <7.0     CBG: No results for input(s): GLUCAP in the last 168 hours.   Past Medical History   Past Medical History:   Diagnosis Date   Abnormal chest x-ray 07/08/2003   COPD changes (2005) + interstitial lung dz changes (2002)   Anxiety and depression    Prozac, celexa, paxil, wellbutrin, and buspar all failures.  Pt has stated xanax is only med that helps.   Depression    Goals of care, counseling/discussion 04/19/2021   Lung cancer, lower lobe ([place]) 04/19/2021   Tobacco dependence       Surgical History    Past Surgical History:  Procedure Laterality Date   ABDOMINAL HYSTERECTOMY  1979   APPENDECTOMY  1966   CATARACT EXTRACTION, BILATERAL     IR IMAGING GUIDED PORT INSERTION  04/25/2021      Social History   Social History   Socioeconomic History   Marital status: Single    Spouse name: Not on file   Number of children: Not on file   Years of education: Not on file   Highest education level: Not on file  Occupational History   Not on file  Tobacco Use   Smoking status: Former    Packs/day: 0.50    Years: 55.00    Pack years: 27.50    Types: Cigarettes    Quit date: 03/07/2021    Years since quitting: 0.3   Smokeless tobacco: Never  Substance and Sexual Activity   Alcohol use: No   Drug use: No   Sexual activity: Not on file  Other Topics Concern   Not on file  Social History Narrative   Right handed   Lives with daughter    Works part time PT    Social Determinants of Health   Financial Resource Strain: Not on file  Food Insecurity: Not on file  Transportation Needs: Not on file  Physical Activity: Not on file  Stress: Not on file  Social Connections: Not on file      Family History    Family History  Problem Relation Age of Onset   Suicidality Father    Heart disease Brother    Cirrhosis Brother    Depression Son    GI Disease Son    Arthritis Son    Diabetes Other    family history includes Arthritis in her son; Cirrhosis in her brother; Depression in her son; Diabetes in an other  family member; GI Disease in her son; Heart disease in her brother;  Suicidality in her father.    Allergies Allergies  Allergen Reactions   Doxycycline Swelling   Prednisone Other (See Comments)    Very emotional, crying, angry   Penicillin G Other (See Comments)   Sulfa Antibiotics Other (See Comments)   Penicillins Rash    Has patient had a PCN reaction causing immediate rash, facial/tongue/throat swelling, SOB or lightheadedness with hypotension: yes Has patient had a PCN reaction causing severe rash involving mucus membranes or skin necrosis: no Has patient had a PCN reaction that required hospitalization: no Has patient had a PCN reaction occurring within the last 10 years: no If all of the above answers are "NO", then may proceed with Cephalosporin use.    Sulfonamide Derivatives Rash      Current Medications  Current Facility-Administered Medications:    etomidate (AMIDATE) 2 MG/ML injection, , , ,    norepinephrine (LEVOPHED) 2m in 2533m(0.016 mg/mL) premix infusion, 0-40 mcg/min, Intravenous, Continuous, BeMaudie FlakesMD, Last Rate: 150 mL/hr at 07/03/21 0556, 40 mcg/min at 07/03/21 0556   rocuronium bromide 100 MG/10ML SOSY, , , ,   Current Outpatient Medications:    acetaminophen (TYLENOL) 500 MG tablet, Take 500 mg by mouth daily as needed for headache (pain)., Disp: , Rfl:    ALPRAZolam (XANAX) 1 MG tablet, Take 0.5-1 mg by mouth See admin instructions. Take one tablet (1 mg) by mouth at 5:30am, take 1/2 tablet (0.5 mg) at 7:30am, 3pm and at bedtime., Disp: , Rfl:    Ascorbic Acid (VITAMIN C) 1000 MG tablet, Take 1,000 mg by mouth every morning., Disp: , Rfl:    azelastine (OPTIVAR) 0.05 % ophthalmic solution, Place 1 drop into both eyes 2 (two) times daily as needed (dry eyes/itching)., Disp: , Rfl:    diclofenac Sodium (VOLTAREN) 1 % GEL, Apply 1 application topically at bedtime., Disp: , Rfl:    HYDROcodone-acetaminophen (NORCO/VICODIN) 5-325 MG tablet, Take 1 tablet by mouth every 4 (four) hours as needed for moderate pain.,  Disp: 60 tablet, Rfl: 0   hypromellose (SYSTANE OVERNIGHT THERAPY) 0.3 % GEL ophthalmic ointment, Place 1 application into both eyes at bedtime as needed for dry eyes., Disp: , Rfl:    lamoTRIgine (LAMICTAL) 25 MG tablet, Take 100 mg by mouth every morning., Disp: , Rfl:    lidocaine-prilocaine (EMLA) cream, Apply to affected area once, Disp: 30 g, Rfl: 3   lipase/protease/amylase (CREON) 36000 UNITS CPEP capsule, Take 36,000 Units by mouth See admin instructions. Take two capsules (72000 units) by mouth three times daily with meals and one capsule (36000 units) with snacks, Disp: , Rfl:    ondansetron (ZOFRAN) 8 MG tablet, Take 1 tablet (8 mg total) by mouth 2 (two) times daily as needed for refractory nausea / vomiting. Start on day 3 after carboplatin chemo., Disp: 30 tablet, Rfl: 1   Probiotic Product (PROBIOTIC PO), Take 1 tablet by mouth every morning., Disp: , Rfl:    prochlorperazine (COMPAZINE) 10 MG tablet, Take 1 tablet (10 mg total) by mouth every 6 (six) hours as needed (Nausea or vomiting)., Disp: 30 tablet, Rfl: 1   traMADol (ULTRAM) 50 MG tablet, Take 1 tablet (50 mg total) by mouth every 6 (six) hours as needed., Disp: 60 tablet, Rfl: 0   TURMERIC PO, Take 1 capsule by mouth daily., Disp: , Rfl:    valACYclovir (VALTREX) 500 MG tablet, Take 500 mg by mouth every morning., Disp: ,  Rfl:    AMBULATORY NON FORMULARY MEDICATION, Lift Chair Diagnosis, Disp: 1 Device, Rfl: 0   AMBULATORY NON FORMULARY MEDICATION, Rollator Diagnosis G 62.89, Disp: 1 Device, Rfl: 0   dexamethasone (DECADRON) 4 MG tablet, Take 2 tablets (8 mg total) by mouth daily. Start the day after carboplatin chemotherapy for 3 days. (Patient not taking: Reported on 07/03/2021), Disp: 30 tablet, Rfl: 1   Multiple Vitamin (MULTIVITAMIN WITH MINERALS) TABS tablet, Take 1 tablet by mouth every morning. Centrum, Disp: , Rfl:    Home Medications  Prior to Admission medications   Medication Sig Start Date End Date Taking?  Authorizing Provider  acetaminophen (TYLENOL) 500 MG tablet Take 500 mg by mouth daily as needed for headache (pain).   Yes [provider]  ALPRAZolam Duanne Moron) 1 MG tablet Take 0.5-1 mg by mouth See admin instructions. Take one tablet (1 mg) by mouth at 5:30am, take 1/2 tablet (0.5 mg) at 7:30am, 3pm and at bedtime. 03/04/21  Yes [provider]  Ascorbic Acid (VITAMIN C) 1000 MG tablet Take 1,000 mg by mouth every morning.   Yes [provider]  azelastine (OPTIVAR) 0.05 % ophthalmic solution Place 1 drop into both eyes 2 (two) times daily as needed (dry eyes/itching).   Yes [provider]  diclofenac Sodium (VOLTAREN) 1 % GEL Apply 1 application topically at bedtime.   Yes [provider]  HYDROcodone-acetaminophen (NORCO/VICODIN) 5-325 MG tablet Take 1 tablet by mouth every 4 (four) hours as needed for moderate pain. 06/10/21  Yes Volanda Napoleon, MD  hypromellose (SYSTANE OVERNIGHT THERAPY) 0.3 % GEL ophthalmic ointment Place 1 application into both eyes at bedtime as needed for dry eyes.   Yes [provider]  lamoTRIgine (LAMICTAL) 25 MG tablet Take 100 mg by mouth every morning. 05/01/16  Yes [provider]  lidocaine-prilocaine (EMLA) cream Apply to affected area once 04/23/21  Yes Ennever, Rudell Cobb, MD  lipase/protease/amylase (CREON) 36000 UNITS CPEP capsule Take 36,000 Units by mouth See admin instructions. Take two capsules (72000 units) by mouth three times daily with meals and one capsule (36000 units) with snacks   Yes [provider]  ondansetron (ZOFRAN) 8 MG tablet Take 1 tablet (8 mg total) by mouth 2 (two) times daily as needed for refractory nausea / vomiting. Start on day 3 after carboplatin chemo. 06/07/21  Yes Volanda Napoleon, MD  Probiotic Product (PROBIOTIC PO) Take 1 tablet by mouth every morning.   Yes [provider]  prochlorperazine (COMPAZINE) 10 MG tablet Take 1 tablet (10 mg total) by mouth  every 6 (six) hours as needed (Nausea or vomiting). 04/23/21  Yes Ennever, Rudell Cobb, MD  traMADol (ULTRAM) 50 MG tablet Take 1 tablet (50 mg total) by mouth every 6 (six) hours as needed. 05/20/21  Yes Volanda Napoleon, MD  TURMERIC PO Take 1 capsule by mouth daily.   Yes [provider]  valACYclovir (VALTREX) 500 MG tablet Take 500 mg by mouth every morning.   Yes [provider]  AMBULATORY NON FORMULARY MEDICATION Lift Chair Diagnosis 03/28/21   Tat, Eustace Quail, DO  AMBULATORY NON FORMULARY MEDICATION Rollator Diagnosis G 62.89 03/28/21   Tat, Rebecca S, DO  dexamethasone (DECADRON) 4 MG tablet Take 2 tablets (8 mg total) by mouth daily. Start the day after carboplatin chemotherapy for 3 days. Patient not taking: Reported on 07/03/2021 04/23/21   Volanda Napoleon, MD  Multiple Vitamin (MULTIVITAMIN WITH MINERALS) TABS tablet Take 1 tablet by mouth  every morning. Centrum    [provider]      Critical care time: 45 minutes.  The treatment and management of the patient's condition was required based on the threat of imminent deterioration. This time reflects time spent by the physician evaluating, providing care and managing the critically ill patient's care. The time was spent at the immediate bedside (or on the same floor/unit and dedicated to this patient's care). Time involved in separately billable procedures is NOT included int he critical care time indicated above. Family meeting and update time may be included above if and only if the patient is unable/incompetent to participate in clinical interview and/or decision making, and the discussion was necessary to determining treatment decisions.   Renee Pain, MD Board Certified by the [employer], [place]

## 2021-07-03 NOTE — ED Provider Notes (Signed)
[facility] Emergency Department Provider Note MRN:  [id number]  Arrival date & time: 07/03/21     Chief Complaint   Weakness   History of Present Illness   Molly Welch is a 75 y.o. year-old female with a history of lung cancer presenting to the ED with chief complaint of weakness.  Weakness fatigue malaise and fever starting earlier today.  Had chemo today.  History of lung cancer.  Patient is hypotensive on arrival.  Review of Systems  A complete 10 system review of systems was obtained and all systems are negative except as noted in the HPI and PMH.   Patient's Health History    Past Medical History:  Diagnosis Date   Abnormal chest x-ray 07/08/2003   COPD changes (2005) + interstitial lung dz changes (2002)   Anxiety and depression    Prozac, celexa, paxil, wellbutrin, and buspar all failures.  Pt has stated xanax is only med that helps.   Depression    Goals of care, counseling/discussion 04/19/2021   Lung cancer, lower lobe ([place]) 04/19/2021   Tobacco dependence     Past Surgical History:  Procedure Laterality Date   ABDOMINAL HYSTERECTOMY  1979   APPENDECTOMY  1966   CATARACT EXTRACTION, BILATERAL     IR IMAGING GUIDED PORT INSERTION  04/25/2021    Family History  Problem Relation Age of Onset   Suicidality Father    Heart disease Brother    Cirrhosis Brother    Depression Son    GI Disease Son    Arthritis Son    Diabetes Other     Social History   Socioeconomic History   Marital status: Single    Spouse name: Not on file   Number of children: Not on file   Years of education: Not on file   Highest education level: Not on file  Occupational History   Not on file  Tobacco Use   Smoking status: Former    Packs/day: 0.50    Years: 55.00    Pack years: 27.50    Types: Cigarettes    Quit date: 03/07/2021    Years since quitting: 0.3   Smokeless tobacco: Never  Substance and Sexual Activity   Alcohol use: No   Drug use:  No   Sexual activity: Not on file  Other Topics Concern   Not on file  Social History Narrative   Right handed   Lives with daughter    Works part time PT    Social Determinants of Health   Financial Resource Strain: Not on file  Food Insecurity: Not on file  Transportation Needs: Not on file  Physical Activity: Not on file  Stress: Not on file  Social Connections: Not on file  Intimate Partner Violence: Not on file     Physical Exam   Vitals:   07/03/21 0400 07/03/21 0423  BP: 101/71 (!) 91/49  Pulse:  (!) 118  Resp: (!) 48 (!) 39  Temp:    SpO2:  100%    CONSTITUTIONAL: Ill-appearing, NAD NEURO:  Alert and oriented x 3, no focal deficits EYES:  eyes equal and reactive ENT/NECK:  no LAD, no JVD CARDIO: Tachycardic rate, well-perfused, normal S1 and S2 PULM:  CTAB no wheezing or rhonchi GI/GU:  normal bowel sounds, non-distended, non-tender MSK/SPINE:  No gross deformities, no edema SKIN:  no rash, atraumatic PSYCH:  Appropriate speech and behavior  *Additional and/or pertinent findings included in MDM below  Diagnostic and Interventional Summary  EKG Interpretation  Date/Time:  Tuesday July 02 2021 23:54:43 EST Ventricular Rate:  107 PR Interval:  123 QRS Duration: 92 QT Interval:  357 QTC Calculation: 477 R Axis:   -7 Text Interpretation: Sinus tachycardia Borderline low voltage, extremity leads Confirmed by Gerlene Fee [phone]) on 07/03/2021 1:19:03 AM       Labs Reviewed  COMPREHENSIVE METABOLIC PANEL - Abnormal; Notable for the following components:      Result Value   Sodium 131 (*)    Potassium 3.4 (*)    CO2 20 (*)    Glucose, Bld 104 (*)    Calcium 8.2 (*)    Total Protein 6.4 (*)    Albumin 3.2 (*)    AST 106 (*)    ALT 47 (*)    All other components within normal limits  LACTIC ACID, PLASMA - Abnormal; Notable for the following components:   Lactic Acid, Venous 4.2 (*)    All other components within normal limits  LACTIC  ACID, PLASMA - Abnormal; Notable for the following components:   Lactic Acid, Venous 7.5 (*)    All other components within normal limits  CBC WITH DIFFERENTIAL/PLATELET - Abnormal; Notable for the following components:   WBC 12.2 (*)    RBC 3.58 (*)    Hemoglobin 10.4 (*)    HCT 33.3 (*)    RDW 17.4 (*)    Neutro Abs 11.8 (*)    Lymphs Abs 0.1 (*)    All other components within normal limits  TROPONIN I (HIGH SENSITIVITY) - Abnormal; Notable for the following components:   Troponin I (High Sensitivity) 23 (*)    All other components within normal limits  TROPONIN I (HIGH SENSITIVITY) - Abnormal; Notable for the following components:   Troponin I (High Sensitivity) 25 (*)    All other components within normal limits  RESP PANEL BY RT-PCR (FLU A&B, COVID) ARPGX2  CULTURE, BLOOD (ROUTINE X 2)  CULTURE, BLOOD (ROUTINE X 2)  URINE CULTURE  PROTIME-INR  APTT  URINALYSIS, ROUTINE W REFLEX MICROSCOPIC    DG Chest 2 View  Final Result      Medications  sodium chloride 0.9 % bolus 500 mL (has no administration in time range)  azithromycin (ZITHROMAX) 500 mg in sodium chloride 0.9 % 250 mL IVPB (has no administration in time range)  acetaminophen (TYLENOL) tablet 1,000 mg (has no administration in time range)  norepinephrine (LEVOPHED) 4mg  in 261mL (0.016 mg/mL) premix infusion (has no administration in time range)  etomidate (AMIDATE) 2 MG/ML injection (has no administration in time range)  rocuronium bromide 100 MG/10ML SOSY (has no administration in time range)  lactated ringers bolus 1,000 mL (1,000 mLs Intravenous New Bag/Given 07/03/21 0029)    And  lactated ringers bolus 500 mL (500 mLs Intravenous New Bag/Given 07/03/21 0125)  ceFEPIme (MAXIPIME) 2 g in sodium chloride 0.9 % 100 mL IVPB (2 g Intravenous New Bag/Given 07/03/21 0030)  LORazepam (ATIVAN) injection 0.5 mg (0.5 mg Intravenous Given 07/03/21 0257)     Procedures  /  Critical Care .Critical Care Performed by:  Maudie Flakes, MD Authorized by: Maudie Flakes, MD   Critical care provider statement:    Critical care time (minutes):  45   Critical care was necessary to treat or prevent imminent or life-threatening deterioration of the following conditions:  Sepsis   Critical care was time spent personally by me on the following activities:  Development of treatment plan with patient or surrogate, discussions with  consultants, evaluation of patient's response to treatment, examination of patient, ordering and review of laboratory studies, ordering and review of radiographic studies, ordering and performing treatments and interventions, pulse oximetry, re-evaluation of patient's condition and review of old charts  ED Course and Medical Decision Making  I have reviewed the triage vital signs, the nursing notes, and pertinent available records from the EMR.  Listed above are laboratory and imaging tests that I personally ordered, reviewed, and interpreted and then considered in my medical decision making (see below for details).  Patient resenting with report of fever, hypotensive, history of lung cancer on chemotherapy.  Code sepsis initiated, providing empiric antibiotics with cefepime, 30 cc/kg fluid bolus and will closely judge fluid responsiveness.  Abdomen is soft and nontender, no meningismus, she denies any specific infectious symptoms.      4 AM update: Patient with clinical deterioration.  She is now altered, more labored breathing, breathing 40 times a minute.  Inconsistent O2 saturation readings.  Blood pressure still soft but holding with maps above 60.  Lactic acid increasing from 4-7.  Discussed management options with patient's daughter.  Patient is DNR/DNI however daughter is not ready for her to die, especially so suddenly without having oncology be involved.  Daughter requesting intubation if necessary.  We discussed the pros and cons of this procedure, we discussed how we prefer to honor  patient's wishes however patient is now the medical decision maker and it is her decision as patient has lost capacity to make this decision.  As a temporizing measure we placed patient on nonrebreather at 15 L and this has improved patient's work of breathing and mental status.  Holding off on intubation for now.  Given the concomitant septic shock and hypoxic respiratory failure will request intensivist admission.  Barth Kirks. Sedonia Small, [place] [email or web address]  Final Clinical Impressions(s) / ED Diagnoses     ICD-10-CM   1. Septic shock (HCC)  A41.9    R65.21     2. Acute respiratory failure with hypoxia (HCC)  J96.01       ED Discharge Orders     None        Discharge Instructions Discussed with and Provided to Patient:   Discharge Instructions   None       Maudie Flakes, MD 07/03/21 [phone]

## 2021-07-03 NOTE — ED Notes (Signed)
ED TO INPATIENT HANDOFF REPORT  ED Nurse Name and Phone #: Salvatore Decent Name/Age/Gender Molly Welch 75 y.o. female Room/Bed: RESB/RESB  Code Status   Code Status: DNR  Home/SNF/Other Home Patient oriented to: self and place Is this baseline? Yes   Triage Complete: Triage complete  Chief Complaint Acute hypoxemic respiratory failure due to COVID-19 ([place]) [U07.1, J96.01]  Triage Note Pt arrived via EMS from home. Pt has hx of lung cancer and had a new chemo treatment today. Pt spiked a fever and feels weak today. Pt's fever at home was 103 F. Pt has been hypotensive with EMS. Pt took 1000mg  of tylenol at 9:17pm   Allergies Allergies  Allergen Reactions   Doxycycline Swelling   Prednisone Other (See Comments)    Very emotional, crying, angry   Penicillin G Other (See Comments)   Sulfa Antibiotics Other (See Comments)   Penicillins Rash    Has patient had a PCN reaction causing immediate rash, facial/tongue/throat swelling, SOB or lightheadedness with hypotension: yes Has patient had a PCN reaction causing severe rash involving mucus membranes or skin necrosis: no Has patient had a PCN reaction that required hospitalization: no Has patient had a PCN reaction occurring within the last 10 years: no If all of the above answers are "NO", then may proceed with Cephalosporin use.    Sulfonamide Derivatives Rash    Level of Care/Admitting Diagnosis ED Disposition     ED Disposition  Admit   Condition  --   Comment  Hospital Area: [facility] [[id number]]  Level of Care: ICU [6]  May admit patient to [facility] or [facility] if equivalent level of care is available:: No  Covid Evaluation: Confirmed COVID Negative  Diagnosis: Acute hypoxemic respiratory failure due to COVID-19 [facility]) [[id number]]  Admitting Physician: Renee Pain [[id number]]  Attending Physician: Renee Pain [[id number]]  Estimated length of stay: 5 - 7 days  Certification:: I certify  this patient will need inpatient services for at least 2 midnights          B Medical/Surgery History Past Medical History:  Diagnosis Date   Abnormal chest x-ray 07/08/2003   COPD changes (2005) + interstitial lung dz changes (2002)   Anxiety and depression    Prozac, celexa, paxil, wellbutrin, and buspar all failures.  Pt has stated xanax is only med that helps.   Depression    Goals of care, counseling/discussion 04/19/2021   Lung cancer, lower lobe ([place]) 04/19/2021   Tobacco dependence    Past Surgical History:  Procedure Laterality Date   ABDOMINAL HYSTERECTOMY  1979   APPENDECTOMY  1966   CATARACT EXTRACTION, BILATERAL     IR IMAGING GUIDED PORT INSERTION  04/25/2021     A IV Location/Drains/Wounds Patient Lines/Drains/Airways Status     Active Line/Drains/Airways     Name Placement date Placement time Site Days   Implanted Port 04/25/21 Right Chest 04/25/21  1551  Chest  69   Peripheral IV 07/02/21 18 G Anterior;Distal;Left Forearm 07/02/21  2344  Forearm  1   Peripheral IV 07/03/21 20 G 1" Right Antecubital 07/03/21  0015  Antecubital  less than 1            Intake/Output Last 24 hours  Intake/Output Summary (Last 24 hours) at 07/03/2021 0742 Last data filed at 07/03/2021 1093 Gross per 24 hour  Intake 2603.73 ml  Output --  Net 2603.73 ml    Labs/Imaging Results for orders placed or performed during the  hospital encounter of 07/02/21 (from the past 48 hour(s))  Comprehensive metabolic panel     Status: Abnormal   Collection Time: 07/02/21 11:43 PM  Result Value Ref Range   Sodium 131 (L) 135 - 145 mmol/L   Potassium 3.4 (L) 3.5 - 5.1 mmol/L   Chloride 102 98 - 111 mmol/L   CO2 20 (L) 22 - 32 mmol/L   Glucose, Bld 104 (H) 70 - 99 mg/dL    Comment: Glucose reference range applies only to samples taken after fasting for at least 8 hours.   BUN 20 8 - 23 mg/dL   Creatinine, Ser 0.94 0.44 - 1.00 mg/dL   Calcium 8.2 (L) 8.9 - 10.3 mg/dL   Total  Protein 6.4 (L) 6.5 - 8.1 g/dL   Albumin 3.2 (L) 3.5 - 5.0 g/dL   AST 106 (H) 15 - 41 U/L   ALT 47 (H) 0 - 44 U/L   Alkaline Phosphatase 62 38 - 126 U/L   Total Bilirubin 0.5 0.3 - 1.2 mg/dL   GFR, Estimated >60 >60 mL/min    Comment: (NOTE) Calculated using the CKD-EPI Creatinine Equation (2021)    Anion gap 9 5 - 15    Comment: Performed at [facility], [place] [address], [place], [place] [address]  Lactic acid, plasma     Status: Abnormal   Collection Time: 07/02/21 11:43 PM  Result Value Ref Range   Lactic Acid, Venous 4.2 (HH) 0.5 - 1.9 mmol/L    Comment: CRITICAL RESULT CALLED TO, READ BACK BY AND VERIFIED WITH:  Charlann Noss RN 07/03/21 @ 0124 VS Performed at [facility], [place] [address] [place], [place] [address]   CBC with Differential     Status: Abnormal   Collection Time: 07/02/21 11:43 PM  Result Value Ref Range   WBC 12.2 (H) 4.0 - 10.5 K/uL   RBC 3.58 (L) 3.87 - 5.11 MIL/uL   Hemoglobin 10.4 (L) 12.0 - 15.0 g/dL   HCT 33.3 (L) 36.0 - 46.0 %   MCV 93.0 80.0 - 100.0 fL   MCH 29.1 26.0 - 34.0 pg   MCHC 31.2 30.0 - 36.0 g/dL   RDW 17.4 (H) 11.5 - 15.5 %   Platelets 202 150 - 400 K/uL   nRBC 0.0 0.0 - 0.2 %   Neutrophils Relative % 97 %   Neutro Abs 11.8 (H) 1.7 - 7.7 K/uL   Lymphocytes Relative 1 %   Lymphs Abs 0.1 (L) 0.7 - 4.0 K/uL   Monocytes Relative 2 %   Monocytes Absolute 0.3 0.1 - 1.0 K/uL   Eosinophils Relative 0 %   Eosinophils Absolute 0.0 0.0 - 0.5 K/uL   Basophils Relative 0 %   Basophils Absolute 0.0 0.0 - 0.1 K/uL   Immature Granulocytes 0 %   Abs Immature Granulocytes 0.05 0.00 - 0.07 K/uL    Comment: Performed at [facility], [place] [address] [place], [place] [address]  Protime-INR     Status: None   Collection Time: 07/02/21 11:43 PM  Result Value Ref Range   Prothrombin Time 13.2 11.4 - 15.2 seconds   INR 1.0 0.8 - 1.2    Comment: (NOTE) INR goal varies based on device and  disease states. Performed at [facility], [place] [address], [place], [place] [address]   Resp Panel by RT-PCR (Flu A&B, Covid) Peripheral     Status: None   Collection Time: 07/03/21 12:03 AM   Specimen: Peripheral;  Nasopharyngeal(NP) swabs in vial transport medium  Result Value Ref Range   SARS Coronavirus 2 by RT PCR NEGATIVE NEGATIVE    Comment: (NOTE) SARS-CoV-2 target nucleic acids are NOT DETECTED.  The SARS-CoV-2 RNA is generally detectable in upper respiratory specimens during the acute phase of infection. The lowest concentration of SARS-CoV-2 viral copies this assay can detect is 138 copies/mL. A negative result does not preclude SARS-Cov-2 infection and should not be used as the sole basis for treatment or other patient management decisions. A negative result may occur with  improper specimen collection/handling, submission of specimen other than nasopharyngeal swab, presence of viral mutation(s) within the areas targeted by this assay, and inadequate number of viral copies(<138 copies/mL). A negative result must be combined with clinical observations, patient history, and epidemiological information. The expected result is Negative.  Fact Sheet for Patients:  EntrepreneurPulse.com.au  Fact Sheet for Healthcare Providers:  IncredibleEmployment.be  This test is no t yet approved or cleared by the [place] FDA and  has been authorized for detection and/or diagnosis of SARS-CoV-2 by FDA under an Emergency Use Authorization (EUA). This EUA will remain  in effect (meaning this test can be used) for the duration of the COVID-19 declaration under Section 564(b)(1) of the Act, 21 U.S.C.section 360bbb-3(b)(1), unless the authorization is terminated  or revoked sooner.       Influenza A by PCR NEGATIVE NEGATIVE   Influenza B by PCR NEGATIVE NEGATIVE    Comment: (NOTE) The Xpert Xpress SARS-CoV-2/FLU/RSV plus assay  is intended as an aid in the diagnosis of influenza from Nasopharyngeal swab specimens and should not be used as a sole basis for treatment. Nasal washings and aspirates are unacceptable for Xpert Xpress SARS-CoV-2/FLU/RSV testing.  Fact Sheet for Patients: EntrepreneurPulse.com.au  Fact Sheet for Healthcare Providers: IncredibleEmployment.be  This test is not yet approved or cleared by the [place] FDA and has been authorized for detection and/or diagnosis of SARS-CoV-2 by FDA under an Emergency Use Authorization (EUA). This EUA will remain in effect (meaning this test can be used) for the duration of the COVID-19 declaration under Section 564(b)(1) of the Act, 21 U.S.C. section 360bbb-3(b)(1), unless the authorization is terminated or revoked.  Performed at [facility], [place] [address], [place], [place] [address]   APTT     Status: None   Collection Time: 07/03/21 12:03 AM  Result Value Ref Range   aPTT 28 24 - 36 seconds    Comment: Performed at [facility], [place] [address], [place], [place] [address]  Troponin I (High Sensitivity)     Status: Abnormal   Collection Time: 07/03/21 12:03 AM  Result Value Ref Range   Troponin I (High Sensitivity) 23 (H) <18 ng/L    Comment: (NOTE) Elevated high sensitivity troponin I (hsTnI) values and significant  changes across serial measurements may suggest ACS but many other  chronic and acute conditions are known to elevate hsTnI results.  Refer to the "Links" section for chest pain algorithms and additional  guidance. Performed at [facility], [place] [address], [place], [place] [address]   Brain natriuretic peptide     Status: None   Collection Time: 07/03/21 12:31 AM  Result Value Ref Range   B Natriuretic Peptide 73.4 0.0 - 100.0 pg/mL    Comment: Performed at [facility], [place] [address], [place], [place] [address]   Lactic acid, plasma     Status: Abnormal   Collection Time: 07/03/21  2:59 AM  Result Value Ref Range   Lactic Acid, Venous 7.5 (HH) 0.5 - 1.9 mmol/L    Comment: CRITICAL VALUE NOTED.  VALUE IS CONSISTENT WITH PREVIOUSLY REPORTED AND CALLED VALUE. Performed at [facility], [place] [address], [place], [place] [address]   Troponin I (High Sensitivity)     Status: Abnormal   Collection Time: 07/03/21  2:59 AM  Result Value Ref Range   Troponin I (High Sensitivity) 25 (H) <18 ng/L    Comment: (NOTE) Elevated high sensitivity troponin I (hsTnI) values and significant  changes across serial measurements may suggest ACS but many other  chronic and acute conditions are known to elevate hsTnI results.  Refer to the "Links" section for chest pain algorithms and additional  guidance. Performed at [facility], [place] [address], [place], [place] [address]   Blood gas, arterial     Status: Abnormal   Collection Time: 07/03/21  5:22 AM  Result Value Ref Range   FIO2 100.00    O2 Content 15.0 L/min   Delivery systems NON-REBREATHER OXYGEN MASK    pH, Arterial 7.357 7.350 - 7.450   pCO2 arterial 34.1 32.0 - 48.0 mmHg   pO2, Arterial 193 (H) 83.0 - 108.0 mmHg   Bicarbonate 18.6 (L) 20.0 - 28.0 mmol/L   Acid-base deficit 5.6 (H) 0.0 - 2.0 mmol/L   O2 Saturation 99.8 %   Patient temperature 98.8    Collection site LEFT RADIAL    Allens test (pass/fail) PASS PASS    Comment: Performed at [place] [address] [place], [place] [address]  Lactic acid, plasma     Status: Abnormal   Collection Time: 07/03/21  6:18 AM  Result Value Ref Range   Lactic Acid, Venous 5.6 (HH) 0.5 - 1.9 mmol/L    Comment: CRITICAL VALUE NOTED.  VALUE IS CONSISTENT WITH PREVIOUSLY REPORTED AND CALLED VALUE. Performed at [facility], [place] [address] [place], [place] [address]    DG Chest 2 View  Result Date: 07/03/2021 CLINICAL DATA:   History of lung cancer status post new chemo treatment today with subsequent fever, weakness and hypotension. EXAM: CHEST - 2 VIEW COMPARISON:  March 26, 2021 FINDINGS: A right-sided venous Port-A-Cath is in place. Its distal tip is limited in visualization secondary to the presence of an overlying cardiac lead. Marked severity diffuse bilateral infiltrates are noted. The pattern of distribution is more prominent within the bilateral lung bases and along the periphery of both lungs. This is markedly increased in severity when compared to the prior study. The heart size and mediastinal contours are within normal limits. The visualized skeletal structures are unremarkable. IMPRESSION: Marked severity diffuse bilateral infiltrates, with a suspected component of underlying pulmonary fibrosis, markedly increased in severity when compared to the prior study. Electronically Signed   By: Virgina Norfolk M.D.   On: 07/03/2021 00:53    Pending Labs Unresulted Labs (From admission, onward)     Start     Ordered   07/04/21 0500  Procalcitonin  Daily,   R      07/03/21 0650   07/04/21 0500  CBC  Tomorrow morning,   R        07/03/21 0650   07/04/21 3419  Basic metabolic panel  Tomorrow morning,   R        07/03/21 0650   07/04/21 0500  Magnesium  Tomorrow morning,   R  07/03/21 0650   07/04/21 0500  Blood gas, arterial  Tomorrow morning,   R        07/03/21 0650   07/03/21 0646  Cortisol  ONCE - STAT,   STAT        07/03/21 0650   07/03/21 0646  Strep pneumoniae urinary antigen  (not at [facility])  Once,   R        07/03/21 0650   07/03/21 0646  Legionella Pneumophila Serogp 1 Ur Ag  Once,   R        07/03/21 0650   07/03/21 0644  Procalcitonin - Baseline  ONCE - STAT,   STAT        07/03/21 0650   07/03/21 0644  CBC  (heparin)  Once,   R       Comments: Baseline for heparin therapy IF NOT ALREADY DRAWN.  Notify MD if PLT < 100 K.    07/03/21 0650   07/03/21 0644  Creatinine, serum  (heparin)   Once,   R       Comments: Baseline for heparin therapy IF NOT ALREADY DRAWN.    07/03/21 0650   07/03/21 0003  Urine Culture  (Septic presentation on arrival (screening labs, nursing and treatment orders for obvious sepsis))  ONCE - STAT,   STAT       Question:  Indication  Answer:  Bacteriuria screening (OB/GYN or Uro)   07/03/21 0005   07/02/21 2343  Culture, blood (Routine x 2)  BLOOD CULTURE X 2,   STAT      07/02/21 2342   07/02/21 2343  Urinalysis, Routine w reflex microscopic  Once,   STAT        07/02/21 2342            Vitals/Pain Today's Vitals   07/03/21 0700 07/03/21 0705 07/03/21 0710 07/03/21 0720  BP: (!) 82/43 (!) 89/54 (!) 85/43 (!) 92/45  Pulse: (!) 106 (!) 106 (!) 105 (!) 102  Resp: (!) 25 (!) 29 (!) 28 (!) 21  Temp:   98 F (36.7 C)   TempSrc:   Oral   SpO2: 99% 99% 100% 100%  Weight:      Height:      PainSc:   6      Isolation Precautions No active isolations  Medications Medications  etomidate (AMIDATE) 2 MG/ML injection (0 mg  Hold 07/03/21 0551)  rocuronium bromide 100 MG/10ML SOSY (0 mg  Hold 07/03/21 0551)  ALPRAZolam (XANAX) tablet 0.5-1 mg (has no administration in time range)  HYDROcodone-acetaminophen (NORCO/VICODIN) 5-325 MG per tablet 1 tablet (has no administration in time range)  hypromellose (GENTEAL) 0.3 % ophthalmic ointment 1 application (has no administration in time range)  lamoTRIgine (LAMICTAL) tablet 100 mg (has no administration in time range)  lipase/protease/amylase (CREON) capsule 36,000 Units (has no administration in time range)  multivitamin with minerals tablet 1 tablet (has no administration in time range)  traMADol (ULTRAM) tablet 50 mg (has no administration in time range)  valACYclovir (VALTREX) tablet 500 mg (has no administration in time range)  docusate sodium (COLACE) capsule 100 mg (has no administration in time range)  polyethylene glycol (MIRALAX / GLYCOLAX) packet 17 g (has no administration in time  range)  heparin injection 5,000 Units (has no administration in time range)  acetaminophen (TYLENOL) tablet 650 mg (has no administration in time range)  0.9 % NaCl with KCl 20 mEq/ L  infusion (has no administration in time range)  pantoprazole (  PROTONIX) injection 40 mg (has no administration in time range)  0.9 %  sodium chloride infusion (has no administration in time range)  norepinephrine (LEVOPHED) 4mg  in 272mL (0.016 mg/mL) premix infusion (40 mcg/min Intravenous New Bag/Given 07/03/21 0659)  ipratropium-albuterol (DUONEB) 0.5-2.5 (3) MG/3ML nebulizer solution 3 mL (has no administration in time range)  albuterol (PROVENTIL) (2.5 MG/3ML) 0.083% nebulizer solution 2.5 mg (has no administration in time range)  methylPREDNISolone sodium succinate (SOLU-MEDROL) 125 mg/2 mL injection 125 mg (has no administration in time range)  lactated ringers bolus 1,000 mL (0 mLs Intravenous Stopped 07/03/21 0435)    And  lactated ringers bolus 500 mL (0 mLs Intravenous Stopped 07/03/21 0435)  ceFEPIme (MAXIPIME) 2 g in sodium chloride 0.9 % 100 mL IVPB (0 g Intravenous Stopped 07/03/21 0435)  LORazepam (ATIVAN) injection 0.5 mg (0.5 mg Intravenous Given 07/03/21 0257)  sodium chloride 0.9 % bolus 500 mL (0 mLs Intravenous Stopped 07/03/21 0514)  azithromycin (ZITHROMAX) 500 mg in sodium chloride 0.9 % 250 mL IVPB (0 mg Intravenous Stopped 07/03/21 0605)  acetaminophen (TYLENOL) tablet 1,000 mg (1,000 mg Oral Given 07/03/21 0430)    Mobility walks with device Low fall risk      R Recommendations: See Admitting Provider Note  Report given to:   Additional Notes:

## 2021-07-04 ENCOUNTER — Ambulatory Visit: Payer: Medicare Other

## 2021-07-04 ENCOUNTER — Inpatient Hospital Stay (HOSPITAL_COMMUNITY): Payer: Medicare Other

## 2021-07-04 DIAGNOSIS — A419 Sepsis, unspecified organism: Secondary | ICD-10-CM | POA: Diagnosis not present

## 2021-07-04 DIAGNOSIS — E440 Moderate protein-calorie malnutrition: Secondary | ICD-10-CM | POA: Insufficient documentation

## 2021-07-04 DIAGNOSIS — R531 Weakness: Secondary | ICD-10-CM | POA: Diagnosis not present

## 2021-07-04 DIAGNOSIS — J189 Pneumonia, unspecified organism: Secondary | ICD-10-CM | POA: Diagnosis not present

## 2021-07-04 DIAGNOSIS — J9601 Acute respiratory failure with hypoxia: Secondary | ICD-10-CM

## 2021-07-04 DIAGNOSIS — R6521 Severe sepsis with septic shock: Secondary | ICD-10-CM | POA: Diagnosis not present

## 2021-07-04 DIAGNOSIS — R509 Fever, unspecified: Secondary | ICD-10-CM | POA: Diagnosis not present

## 2021-07-04 DIAGNOSIS — F419 Anxiety disorder, unspecified: Secondary | ICD-10-CM

## 2021-07-04 DIAGNOSIS — Z9981 Dependence on supplemental oxygen: Secondary | ICD-10-CM

## 2021-07-04 LAB — LACTIC ACID, PLASMA
Lactic Acid, Venous: 2.9 mmol/L (ref 0.5–1.9)
Lactic Acid, Venous: 2.6 mmol/L (ref 0.5–1.9)

## 2021-07-04 LAB — CBC
HCT: 31 % — ABNORMAL LOW (ref 36.0–46.0)
Hemoglobin: 10.1 g/dL — ABNORMAL LOW (ref 12.0–15.0)
MCH: 28.9 pg (ref 26.0–34.0)
MCHC: 32.6 g/dL (ref 30.0–36.0)
MCV: 88.8 fL (ref 80.0–100.0)
Platelets: 165 10*3/uL (ref 150–400)
RBC: 3.49 MIL/uL — ABNORMAL LOW (ref 3.87–5.11)
RDW: 17.3 % — ABNORMAL HIGH (ref 11.5–15.5)
WBC: 15.1 10*3/uL — ABNORMAL HIGH (ref 4.0–10.5)
nRBC: 0 % (ref 0.0–0.2)

## 2021-07-04 LAB — BASIC METABOLIC PANEL
Anion gap: 6 (ref 5–15)
BUN: 29 mg/dL — ABNORMAL HIGH (ref 8–23)
CO2: 19 mmol/L — ABNORMAL LOW (ref 22–32)
Calcium: 6.9 mg/dL — ABNORMAL LOW (ref 8.9–10.3)
Chloride: 107 mmol/L (ref 98–111)
Creatinine, Ser: 0.75 mg/dL (ref 0.44–1.00)
GFR, Estimated: >60 mL/min (ref >60)
Glucose, Bld: 130 mg/dL — ABNORMAL HIGH (ref 70–99)
Potassium: 4.6 mmol/L (ref 3.5–5.1)
Sodium: 132 mmol/L — ABNORMAL LOW (ref 135–145)

## 2021-07-04 LAB — BLOOD GAS, ARTERIAL
Acid-base deficit: 3.9 mmol/L — ABNORMAL HIGH (ref 0.0–2.0)
Bicarbonate: 20.2 mmol/L (ref 20.0–28.0)
O2 Saturation: 94.5 %
Patient temperature: 98.6
pCO2 arterial: 35.6 mmHg (ref 32.0–48.0)
pH, Arterial: 7.373 (ref 7.350–7.450)
pO2, Arterial: 71.3 mmHg — ABNORMAL LOW (ref 83.0–108.0)

## 2021-07-04 LAB — ECHOCARDIOGRAM COMPLETE
AR max vel: 2.12 cm2
AV Area VTI: 2.05 cm2
AV Area mean vel: 1.98 cm2
AV Mean grad: 7 mmHg
AV Peak grad: 13.4 mmHg
Ao pk vel: 1.83 m/s
Area-P 1/2: 4.49 cm2
Height: 62 in
S' Lateral: 2.5 cm
Weight: 2003.54 oz

## 2021-07-04 LAB — MAGNESIUM: Magnesium: 1.9 mg/dL (ref 1.7–2.4)

## 2021-07-04 LAB — PROCALCITONIN: Procalcitonin: 19.55 ng/mL

## 2021-07-04 LAB — SEDIMENTATION RATE: Sed Rate: 52 mm/hr — ABNORMAL HIGH (ref 0–22)

## 2021-07-04 MED ORDER — ALPRAZOLAM 0.5 MG PO TABS
0.5000 mg | ORAL_TABLET | Freq: Four times a day (QID) | ORAL | Status: DC
  Administered 2021-07-04 – 2021-07-11 (×29): 0.5 mg via ORAL
  Filled 2021-07-04 (×28): qty 1

## 2021-07-04 MED ORDER — MAGNESIUM SULFATE 2 GM/50ML IV SOLN
2.0000 g | Freq: Once | INTRAVENOUS | Status: AC
  Administered 2021-07-04: 19:00:00 2 g via INTRAVENOUS
  Filled 2021-07-04: qty 50

## 2021-07-04 MED ORDER — PANTOPRAZOLE SODIUM 40 MG PO TBEC
40.0000 mg | DELAYED_RELEASE_TABLET | Freq: Every day | ORAL | Status: DC
  Administered 2021-07-04 – 2021-07-05 (×2): 40 mg via ORAL
  Filled 2021-07-04 (×2): qty 1

## 2021-07-04 MED ORDER — AZITHROMYCIN 250 MG PO TABS
500.0000 mg | ORAL_TABLET | Freq: Every day | ORAL | Status: DC
Start: 2021-07-05 — End: 2021-07-06
  Administered 2021-07-05 – 2021-07-06 (×2): 500 mg via ORAL
  Filled 2021-07-04 (×2): qty 2

## 2021-07-04 MED ORDER — ENSURE ENLIVE PO LIQD
237.0000 mL | Freq: Three times a day (TID) | ORAL | Status: DC
  Administered 2021-07-04 – 2021-07-11 (×15): 237 mL via ORAL

## 2021-07-04 MED ORDER — DOCUSATE SODIUM 100 MG PO CAPS
100.0000 mg | ORAL_CAPSULE | Freq: Every day | ORAL | Status: DC
  Administered 2021-07-04: 11:00:00 100 mg via ORAL
  Filled 2021-07-04: qty 1

## 2021-07-04 MED ORDER — ALPRAZOLAM 1 MG PO TABS: 1.0000 mg | ORAL_TABLET | ORAL | Status: DC

## 2021-07-04 NOTE — Progress Notes (Signed)
Molly Welch does look better.  She is able to talk a bit better.  So far, cultures have not come back positive.  She is on broad-spectrum coverage.  Her blood pressure seems to be a little bit better.  She is on nasal cannula now..  She did have a chest x-ray done this morning.  Results are still pending.  She has not had diarrhea.  Her labs show sodium 132.  Potassium 4.6.  BUN 29 creatinine 0.75.  Calcium is 6.9.  The procalcitonin is 19.5.  Her white cell count 15.1.  Hemoglobin 10.1.  Platelet count 165,000.  We will have to watch her white cell count closely.  I know that she will become neutropenic.  When I feel that she is going be at neutropenic levels, we will then start her on Neupogen.  She has had no problems with bleeding.  There is no issues with pain.  She does have a lot of anxiety which is chronic.  Again, we will see what the chest x-ray shows.  Hopefully, it will be better.  Her oxygen saturation is certainly improving.  I am just happy that she is getting better.  Again, her tumor was responding to treatment.  Given this episode, we may have to make a change our strategy for further therapy.  Hopefully, she will be able to have a little bit more oral intake.  I know the ICU staff are doing a fantastic job with her.  She is very pleased with the compassion that has been shown in the professional care that she is getting.  Her faith remains quite strong.  We did have a very nice prayer.  Lattie Haw, MD  Psalms 30:2

## 2021-07-04 NOTE — Progress Notes (Signed)
Initial Nutrition Assessment  DOCUMENTATION CODES:   Non-severe (moderate) malnutrition in context of chronic illness  INTERVENTION:  - will order Ensure Enlive TID, each supplement provides 350 kcal and 20 grams of protein.   NUTRITION DIAGNOSIS:   Moderate Malnutrition related to chronic illness, cancer and cancer related treatments as evidenced by moderate fat depletion, moderate muscle depletion.  GOAL:   Patient will meet greater than or equal to 90% of their needs  MONITOR:   PO intake, Supplement acceptance, Labs, Weight trends  REASON FOR ASSESSMENT:   Rounds  ASSESSMENT:   75 y.o. female with medical history of stage IV lung cancer, tobacco abuse, anxiety, and depression. She presented to the ED via EMS due to weakness and malaise after chemo treatment. In the ED she was hypotensive and febrile with fever of 103 F.  The only documented meal completion percentage was 50% of dinner last night. Patient reports that she ate 50% of breakfast today which consisted of an omelet and bacon.  RN at bedside to provide creon so patient could begin lunch meal. RN shares patient became nauseated while eating breakfast. Patient confirms and states that this is typical for her and that she has zofran at home which is beneficial when she becomes nauseated.   After beginning chemo, she has started to have taste alteration. She was unable to provide details beyond stating things taste off and that things she once enjoyed do not taste good.  No chewing or swallowing difficulty although the left side of her mouth is consistently dry and right side is not. She has biotene at home which is helpful.   She tries to drink Ensure TID at home. She is interested in receiving it during hospitalization.  She was having no difficulties with ambulation until fairly recently when it became very difficult for her to walk, even with a walker. She is looking forward to PT possibly working with her later  today.   Weight today is 125 lb and is the highest weight recorded in the past 8 months.    Labs reviewed; Na: 132 mmol/l, BUN: 29 mg/dl, Ca: 6.9 mg/dl.  Medications reviewed; 100 mg colace/day, 36000 units creon BID PRN, 72000 units creon TID with meals, 1 tablet multivitamin with minerals/day, 40 mg IV protonix/day, 40 mEq Klor-Con x1 dose 12/28.    NUTRITION - FOCUSED PHYSICAL EXAM:  Flowsheet Row Most Recent Value  Orbital Region Moderate depletion  Upper Arm Region Moderate depletion  Thoracic and Lumbar Region Unable to assess  Buccal Region Moderate depletion  Temple Region Moderate depletion  Clavicle Bone Region Moderate depletion  Clavicle and Acromion Bone Region Moderate depletion  Scapular Bone Region Mild depletion  Dorsal Hand Mild depletion  Patellar Region Mild depletion  Anterior Thigh Region Mild depletion  Posterior Calf Region Mild depletion  Edema (RD Assessment) Mild  [BLE]  Hair Reviewed  Eyes Reviewed  Mouth Reviewed  Skin Reviewed  Nails Reviewed       Diet Order:   Diet Order             Diet regular Room service appropriate? Yes; Fluid consistency: Thin  Diet effective now                   EDUCATION NEEDS:   Education needs have been addressed  Skin:     Last BM:  12/27 per patient report  Height:   Ht Readings from Last 1 Encounters:  07/02/21 5\' 2"  (1.575 m)  Weight:   Wt Readings from Last 1 Encounters:  07/04/21 56.8 kg     Estimated Nutritional Needs:   Kcal:  1750-1950 kcal  Protein:  85-100 grams  Fluid:  >/= 1.8 L/day      Jarome Matin, MS, RD, LDN, CNSC Inpatient Clinical Dietitian RD pager # available in [facility]  After hours/weekend pager # available in [facility]

## 2021-07-04 NOTE — Progress Notes (Signed)
Pharmacy: IV to PO  IV PPI>> PO IV azithromycin > PO per protocol  Eudelia Bunch, Pharm.D 07/04/2021 1:46 PM

## 2021-07-04 NOTE — Progress Notes (Addendum)
NAME:  Molly Welch, MRN:  [id number], DOB:  06-10-1946, LOS: 1 ADMISSION DATE:  07/02/2021, CONSULTATION DATE:  07/03/21 REFERRING MD:  Dr. Memory Dance, CHIEF COMPLAINT:  Weakness/malaise   History of Present Illness:  This 75 y.o. female with a PMH significant for Stage 3B lung cancer, tobacco abuse, anxiety, and depression who presented to the [facility] Emergency Department via EMS with complaints of weakness and malaise after undergoing chemotherapy earlier in the day.  In the emergency department, the patient was found to be hypotensive and febrile.  She had a fever of 103 F at home.  She had increasing oxygen requirement and is currently on 100% nonrebreather.  She has been started on norepinephrine for blood pressure support.  Lactate was elevated.  She was started on cefepime/azithromycin.   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  She is currently on carboplatin/Taxol/Yervoy/Opdivo.  Pertinent  Medical History  Stage 3B lung cancer, tobacco abuse, anxiety, and depression  Significant Hospital Events: Including procedures, antibiotic start and stop dates in addition to other pertinent events   12/28 admitted for ? Septic shock and acute hypoxic respiratory failure  12/29 Oxygen demand and pressor support significantly improved.   Interim History / Subjective:  States she feels much better with no significant complaints this am  No acute events overnight   Objective   Blood pressure (!) 106/46, pulse 90, temperature 99 F (37.2 C), temperature source Oral, resp. rate (!) 23, height 5' 2"  (1.575 m), weight 56.8 kg, SpO2 100 %.        Intake/Output Summary (Last 24 hours) at 07/04/2021 0914 Last data filed at 07/04/2021 0600 Gross per 24 hour  Intake 2134.48 ml  Output 1950 ml  Net 184.48 ml   Filed Weights   07/02/21 2356 07/04/21 0500  Weight: 48.5 kg 56.8 kg     Examination: General: Acute on chronically ill appearing elderly female lying in bed, in NAD HEENT: [place]/AT, MM pink/moist, PERRL,  Neuro: Alert and oriented x3, non-focal  CV: s1s2 regular rate and rhythm, no murmur, rubs, or gallops,  PULM:  Clear to ascultation, no increased work of breathing, oxygen saturations appropriate on 4L [place]  GI: soft, bowel sounds active in all 4 quadrants, non-tender, non-distended, tolerating oral diet Extremities: warm/dry, no edema  Skin: no rashes or lesions  Resolved Hospital Problem list     Assessment & Plan:  Acute Hypoxic Respiratory Failure in the setting of CAP vs  chemotherapy pneumonitis -PNA very likely but chemotherapy pneumonitis is also on differential  but favor PNA given PCT 28 (though this has to be interpreted carefully given her known malignancy and immunocompromised state). 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  -She is currently on carboplatin/Taxol/Yervoy/Opdivo  P: Continue HFNC for sat goal > 92% Head of bed elevated 30 degrees Follow intermittent chest x-ray and ABG Ensure adequate pulmonary hygiene  Follow cultures  Continue empiric Cefepime and Azithromycin  Step pna urine and legionella pending   Severe shock state felt secondary to sepsis in the setting of PNA -Patient presented tachycardic, tachypneic, hypotensive with a lactic acid of 4.2 (peaked at 7.5), and leucocytotic with a WBC 21.7 meting criteria for severe septic shock  -Less likely but still of concern is component of cardiogenic 2/2 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 P: Remains in the  ICU for close observation  Supplemental oxygen Follow pan cultures  Continue empiric antibiotics  Continue pressors for MAP goal >65 Trend lactic acid Monitor urine output  Hyponatremia P: Trend Bmet  Anemia of chronic disease  P: Trend CBC  Transfuse per protocol  Hgb goal > 7  Tobacco use P: Cessation education when  appropriate   Best Practice (right click and "Reselect all SmartList Selections" daily)   Diet/type: Regular consistency (see orders) DVT prophylaxis: prophylactic heparin  GI prophylaxis: PPI Lines: N/A Foley:  N/A Code Status:  limited Last date of multidisciplinary goals of care discussion: Update patient and family daily   Critical care time:    CRITICAL CARE Performed by: Whitney D. Harris  Total critical care time: 38 minutes  Critical care time was exclusive of separately billable procedures and treating other patients.  Critical care was necessary to treat or prevent imminent or life-threatening deterioration.  Critical care was time spent personally by me on the following activities: development of treatment plan with patient and/or surrogate as well as nursing, discussions with consultants, evaluation of patient's response to treatment, examination of patient, obtaining history from patient or surrogate, ordering and performing treatments and interventions, ordering and review of laboratory studies, ordering and review of radiographic studies, pulse oximetry and re-evaluation of patient's condition.  Whitney D. Kenton Kingfisher, NP-C [facility] Personal contact information can be found on Amion  07/04/2021, 9:34 AM

## 2021-07-04 NOTE — TOC Initial Note (Signed)
Transition of Care [facility]) - Initial/Assessment Note    Patient Details  Name: Molly Welch MRN: [id number] Date of Birth: 07-31-1945  Transition of Care [facility]) CM/SW Contact:    Leeroy Cha, RN Phone Number: 07/04/2021, 7:40 AM  Clinical Narrative:                  Transition of Care [facility]) Screening Note   Patient Details  Name: Molly Welch Date of Birth: [birth year]   Transition of Care [facility]) CM/SW Contact:    Leeroy Cha, RN Phone Number: 07/04/2021, 7:40 AM    Transition of Care Department [facility]) has reviewed patient and no TOC needs have been identified at this time. We will continue to monitor patient advancement through interdisciplinary progression rounds. If new patient transition needs arise, please place a TOC consult. Has pcp and oncologist,hx of lung ca.   Plan: is to return to home.  Lives alone.  Has daughter in the area for support.  Expected Discharge Plan: Home/Self Care Barriers to Discharge: Continued Medical Work up   Patient Goals and CMS Choice     Choice offered to / list presented to : Patient  Expected Discharge Plan and Services Expected Discharge Plan: Home/Self Care   Discharge Planning Services: CM Consult   Living arrangements for the past 2 months: Single Family Home                                      Prior Living Arrangements/Services Living arrangements for the past 2 months: Single Family Home Lives with:: Self Patient language and need for interpreter reviewed:: Yes Do you feel safe going back to the place where you live?: Yes            Criminal Activity/Legal Involvement Pertinent to Current Situation/Hospitalization: No - Comment as needed  Activities of Daily Living      Permission Sought/Granted                  Emotional Assessment Appearance:: Appears stated age Attitude/Demeanor/Rapport: Engaged Affect (typically observed): Calm Orientation: : Oriented to Self, Oriented to  Place, Oriented to  Time, Oriented to Situation Alcohol / Substance Use: Not Applicable Psych Involvement: No (comment)  Admission diagnosis:  Abnormal chest x-ray [R93.89] Acute respiratory failure with hypoxia ([place]) [J96.01] Septic shock ([place]) [A41.9, R65.21] Acute hypoxemic respiratory failure due to COVID-19 ([place]) [U07.1, J96.01] Patient Active Problem List   Diagnosis Date Noted   Septic shock ([place]) 07/03/2021   Acute respiratory failure with hypoxemia ([place]) 07/03/2021   Lactic acidosis 07/03/2021   Hyponatremia 07/03/2021   Leukocytosis 07/03/2021   Anemia of chronic disease 07/03/2021   Abnormal chest x-ray 07/03/2021   Acute hypoxemic respiratory failure due to COVID-19 [facility]) 07/03/2021   Goals of care, counseling/discussion 04/19/2021   Lung cancer, lower lobe ([place]) 04/19/2021   Mass of left lung    Recurrent falls 03/24/2021   CAP (community acquired pneumonia) 03/23/2021   Vertigo 03/23/2021   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

## 2021-07-05 DIAGNOSIS — A419 Sepsis, unspecified organism: Secondary | ICD-10-CM | POA: Diagnosis not present

## 2021-07-05 DIAGNOSIS — J8410 Pulmonary fibrosis, unspecified: Secondary | ICD-10-CM

## 2021-07-05 DIAGNOSIS — J849 Interstitial pulmonary disease, unspecified: Secondary | ICD-10-CM

## 2021-07-05 DIAGNOSIS — R531 Weakness: Secondary | ICD-10-CM | POA: Diagnosis not present

## 2021-07-05 DIAGNOSIS — R509 Fever, unspecified: Secondary | ICD-10-CM | POA: Diagnosis not present

## 2021-07-05 DIAGNOSIS — J189 Pneumonia, unspecified organism: Secondary | ICD-10-CM | POA: Diagnosis not present

## 2021-07-05 LAB — BASIC METABOLIC PANEL
Anion gap: 9 (ref 5–15)
BUN: 27 mg/dL — ABNORMAL HIGH (ref 8–23)
CO2: 24 mmol/L (ref 22–32)
Calcium: 8.4 mg/dL — ABNORMAL LOW (ref 8.9–10.3)
Chloride: 103 mmol/L (ref 98–111)
Creatinine, Ser: 0.62 mg/dL (ref 0.44–1.00)
GFR, Estimated: >60 mL/min (ref >60)
Glucose, Bld: 111 mg/dL — ABNORMAL HIGH (ref 70–99)
Potassium: 4.5 mmol/L (ref 3.5–5.1)
Sodium: 136 mmol/L (ref 135–145)

## 2021-07-05 LAB — CBC
HCT: 32.7 % — ABNORMAL LOW (ref 36.0–46.0)
Hemoglobin: 10.7 g/dL — ABNORMAL LOW (ref 12.0–15.0)
MCH: 29.2 pg (ref 26.0–34.0)
MCHC: 32.7 g/dL (ref 30.0–36.0)
MCV: 89.3 fL (ref 80.0–100.0)
Platelets: 165 10*3/uL (ref 150–400)
RBC: 3.66 MIL/uL — ABNORMAL LOW (ref 3.87–5.11)
RDW: 18.1 % — ABNORMAL HIGH (ref 11.5–15.5)
WBC: 12.5 10*3/uL — ABNORMAL HIGH (ref 4.0–10.5)
nRBC: 0 % (ref 0.0–0.2)

## 2021-07-05 LAB — STREP PNEUMONIAE URINARY ANTIGEN: Strep Pneumo Urinary Antigen: NEGATIVE

## 2021-07-05 LAB — MAGNESIUM: Magnesium: 2.5 mg/dL — ABNORMAL HIGH (ref 1.7–2.4)

## 2021-07-05 LAB — PHOSPHORUS: Phosphorus: 1.8 mg/dL — ABNORMAL LOW (ref 2.5–4.6)

## 2021-07-05 LAB — LACTIC ACID, PLASMA: Lactic Acid, Venous: 1.4 mmol/L (ref 0.5–1.9)

## 2021-07-05 LAB — PROCALCITONIN: Procalcitonin: 12.8 ng/mL

## 2021-07-05 LAB — CORTISOL: Cortisol, Plasma: 14.5 ug/dL

## 2021-07-05 MED ORDER — IPRATROPIUM-ALBUTEROL 0.5-2.5 (3) MG/3ML IN SOLN
3.0000 mL | Freq: Four times a day (QID) | RESPIRATORY_TRACT | Status: DC
  Administered 2021-07-05 – 2021-07-06 (×6): 3 mL via RESPIRATORY_TRACT
  Filled 2021-07-05 (×6): qty 3

## 2021-07-05 MED ORDER — BUDESONIDE 0.25 MG/2ML IN SUSP
0.2500 mg | Freq: Two times a day (BID) | RESPIRATORY_TRACT | Status: DC
  Administered 2021-07-05 – 2021-07-11 (×13): 0.25 mg via RESPIRATORY_TRACT
  Filled 2021-07-05 (×12): qty 2

## 2021-07-05 MED ORDER — DOCUSATE SODIUM 100 MG PO CAPS: 100.0000 mg | ORAL_CAPSULE | Freq: Every day | ORAL | Status: DC | PRN

## 2021-07-05 MED ORDER — MENTHOL 3 MG MT LOZG
1.0000 | LOZENGE | OROMUCOSAL | Status: DC | PRN
  Filled 2021-07-05: qty 9

## 2021-07-05 MED ORDER — MIDODRINE HCL 5 MG PO TABS
10.0000 mg | ORAL_TABLET | Freq: Three times a day (TID) | ORAL | Status: DC
  Administered 2021-07-06 – 2021-07-08 (×7): 10 mg via ORAL
  Filled 2021-07-05 (×7): qty 2

## 2021-07-05 MED ORDER — SODIUM PHOSPHATES 45 MMOLE/15ML IV SOLN
30.0000 mmol | Freq: Once | INTRAVENOUS | Status: AC
  Administered 2021-07-05: 09:00:00 30 mmol via INTRAVENOUS
  Filled 2021-07-05: qty 10

## 2021-07-05 MED ORDER — HYDROCORTISONE SOD SUC (PF) 100 MG IJ SOLR
100.0000 mg | Freq: Two times a day (BID) | INTRAMUSCULAR | Status: DC
  Administered 2021-07-05 – 2021-07-07 (×4): 100 mg via INTRAVENOUS
  Filled 2021-07-05 (×4): qty 2

## 2021-07-05 NOTE — Progress Notes (Addendum)
NAME:  Molly Welch, MRN:  [id number], DOB:  Dec 14, 1945, LOS: 2 ADMISSION DATE:  07/02/2021, CONSULTATION DATE:  07/03/21 REFERRING MD:  Dr. Memory Dance, CHIEF COMPLAINT:  Weakness/malaise   BRIEF  This 75 y.o. female with a PMH significant for Stage 3B lung cancer, tobacco abuse, anxiety, and depression who presented to the [facility] Emergency Department via EMS with complaints of weakness and malaise after undergoing chemotherapy earlier in the day.  In the emergency department, the patient was found to be hypotensive and febrile.  She had a fever of 103 F at home.  She had increasing oxygen requirement and is currently on 100% nonrebreather.  She has been started on norepinephrine for blood pressure support.  Lactate was elevated.  She was started on cefepime/azithromycin.   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  She is currently on carboplatin/Taxol/Yervoy/Opdivo.   ILD hx elicited 36/62/94  =-  Daughter tells me that some 2 years ago a chest x-ray was done and patient/daughter was informed she has pulmonary fibrosis but they did nothing about it.  Patient's continue to smoke and has had a cough.  Over the last 2 years the cough has gotten worse but they attribute this to smoking.  She never had shortness of breath.  Then in the last 1 year she started having gait issues.  September 2022 diagnosed with lung cancer and started therapy for lung cancer.  After this the gait improved.  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  Nevertheless she was using a walker  Pertinent  Medical History  Stage 3B lung cancer, tobacco abuse, anxiety, and depression  Significant Hospital Events: Including procedures, antibiotic start and stop dates in addition to other pertinent events   12/28 admitted for ? Septic shock and acute hypoxic respiratory failure  12/29 Oxygen demand and pressor support significantly improved.    Interim History / Subjective:   12/30 -afebrile since admission 06/25/2021.  White count is slightly better 12,500.  Cultures are negative so far she is on oxygen 2 L nasal cannula.  She continues to be on Levophed is on antibiotics.  Procalcitonin is improved to 12.8.  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  Although radiologist did notice stability in the fibrosis itself.  She also has associated moderate level of emphysema.  No ongoing left lower lobe lung mass 6.2 cm is unchanged.  The new issue is that she has patchy consolidation in the lower lobes bilaterally, personal visualization infectious process of bacterial pneumonia consistent with a fever, hypoxemia, high procalcitonin, pulmonary infiltrates that are new    Objective   Blood pressure (!) 81/50, pulse (!) 106, temperature 98.2 F (36.8 C), temperature source Oral, resp. rate (!) 21, height 5' 2" (1.575 m), weight 57 kg, SpO2 (!) 88 %.        Intake/Output Summary (Last 24 hours) at 07/05/2021 1148 Last data filed at 07/05/2021 1100 Gross per 24 hour  Intake 995.5 ml  Output 1902 ml  Net -906.5 ml   Filed Weights   07/02/21 2356 07/04/21 0500 07/05/21 0435  Weight: 48.5 kg 56.8 kg 57 kg    General Appearance:  Thin, cachectic, Sitting in chair Head:  Normocephalic, without obvious abnormality, atraumatic Eyes:  PERRL - yes, conjunctiva/corneas - muddy     Ears:  Normal  external ear canals, both ears Nose:  G tube - no but has [place] o2 Throat:  ETT TUBE - no , OG tube - no Neck:  Supple,  No enlargement/tenderness/nodules Lungs: Thin chest. Velcro crackles at base. Mild tachypneic Heart:  S1 and S2 normal, no murmur, CVP - no.  Pressors - yes levophed Abdomen:  Soft, no masses, no organomegaly Genitalia / Rectal:  Not done Extremities:  Extremities- intact Skin:  ntact in exposed areas . Sacral area - not  examined Neurologic:  Sedation - none -> RASS - +1 . Moves all 4s - yes. CAM-ICU - neg . 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  Natural history of IPF is that it is a progressive disease with average life expectancy of few to several years.  She currently does not have IPF flareup.  She is at extreme high risk for flareup even this admission and definite progression given recent chemotherapy and proposed radiation and current pneumonia.  Antifibrotic's are indicated but she will definitely have GI side effects given her frail situation.  Plan - Do ILD questionnaire and serology -> based on the answers IPF diagnosis can be further confirmed - Do bronchodilators and nebulized steroids for emphysema   Acute Hypoxic Respiratory Failure in the setting of CAP vs  HCAP v chemotherapy pneumonitis  07/05/2021 -still requiring 2-4 L nasal cannula  Plan - Oxygen for pulse ox goal greater than 92% - Okay for BiPAP if she declines - DO NOT INTUBATE if worsens] I did explain to the patient and the daughter separately that in the setting of pulmonary fibrosis if she were to decline for what ever reason and got intubated then she would not be able to come off the ventilator based on my clinical experience.  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 Likely CAP .  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  07/05/2021 -still on low-dose Levophed P: Continue pressors for MAP goal >65 Check random cortisol and if low start empiric steroids  Hyponatremia P: Trend Bmet  Anemia of chronic disease - prsent at admit  07/05/2021 - stable  P: - PRBC for hgb </= 6.9gm%    - exceptions are   -  if ACS susepcted/confirmed then transfuse for hgb </= 8.0gm%,  or    -  active bleeding with hemodynamic instability,  then transfuse regardless of hemoglobin value   At at all times try to transfuse 1 unit prbc as possible with exception of active hemorrhage  Frail and physical deconditioning - Prior to & Present on Admit  Plan  - inhouse PT - daughte rwants her home    Best Practice (right click and "Reselect all SmartList Selections" daily)   Diet/type: Regular consistency (see orders) DVT prophylaxis: prophylactic heparin  GI prophylaxis: PPI Lines: N/A Foley:  N/A Code Status:  limited Last date of multidisciplinary goals of care discussion:   - Done on 07/05/2021 with the patient and the nurse and separately with the daughter on the telephone: Revert back to DNR but full medical care   [place]   The patient Molly Welch is critically ill with multiple organ systems failure and requires high complexity decision making for assessment and support, frequent evaluation and  titration of therapies, application of advanced monitoring technologies and extensive interpretation of multiple databases.   Critical Care Time devoted to patient care services described in this note is  45  Minutes. This time reflects time of care of this signee Dr Brand Males. This critical care time does not reflect procedure time, or teaching time or supervisory time of PA/NP/Med student/Med Resident etc but could involve care discussion time     Dr. Brand Males, M.D., [facility].C.P Pulmonary and Critical Care Medicine Staff Physician  Pulmonary and Critical Care Pager: [phone], If no answer or between  15:00h - 7:00h: call [phone]  07/05/2021 11:57 AM    LABS    PULMONARY Recent Labs  Lab 07/03/21 0522 07/04/21 0354  PHART 7.357 7.373  PCO2ART 34.1 35.6  PO2ART 193* 71.3*  HCO3 18.6* 20.2  O2SAT 99.8 94.5    CBC Recent Labs  Lab 07/03/21 0759 07/04/21 0230 07/05/21 0438  HGB 11.6* 10.1* 10.7*  HCT 36.6 31.0* 32.7*  WBC 21.7* 15.1* 12.5*  PLT 257 165 165    COAGULATION Recent Labs  Lab 07/02/21 2343  INR 1.0    CARDIAC  No results for input(s): TROPONINI in the last 168 hours. No results for input(s): PROBNP in the last 168 hours.   CHEMISTRY Recent Labs  Lab 07/02/21 0806 07/02/21 2343 07/03/21 0759 07/04/21 0230 07/05/21 0438  NA 136 131*  --  132* 136  K 4.1 3.4*  --  4.6 4.5  CL 98 102  --  107 103  CO2 30 20*  --  19* 24  GLUCOSE 94 104*  --  130* 111*  BUN 18 20  --  29* 27*  CREATININE 0.66 0.94 1.22* 0.75 0.62  CALCIUM 10.1 8.2*  --  6.9* 8.4*  MG  --   --   --  1.9 2.5*  PHOS  --   --   --   --  1.8*   Estimated Creatinine Clearance: 49.5 mL/min (by C-G formula based on SCr of 0.62 mg/dL).   LIVER Recent Labs  Lab 07/02/21 0806 07/02/21 2343  AST 18 106*  ALT 11 47*  ALKPHOS 72 62  BILITOT 0.3 0.5  PROT 7.4 6.4*  ALBUMIN 4.0 3.2*  INR  --  1.0     INFECTIOUS Recent  Labs  Lab 07/03/21 0759 07/04/21 0230 07/04/21 1128 07/04/21 1622 07/05/21 0435 07/05/21 0438  LATICACIDVEN  --   --  2.9* 2.6* 1.4  --   PROCALCITON 28.16 19.55  --   --   --  12.80     ENDOCRINE CBG (last 3)  No results for input(s): GLUCAP in the last 72 hours.       IMAGING x48h  - image(s) personally visualized  -   highlighted in bold CT Chest High Resolution  Result Date: 07/04/2021 CLINICAL DATA:  Inpatient. Left lung cancer. Interstitial lung disease. Hypoxemia. EXAM: CT CHEST WITHOUT CONTRAST TECHNIQUE: Multidetector CT imaging of the chest was performed following the standard protocol without intravenous contrast. High resolution imaging of the lungs, as well as inspiratory and expiratory imaging, was performed. COMPARISON:  Chest radiograph from earlier today. 06/04/2021 PET-CT. 03/23/2021 chest CT. FINDINGS: Cardiovascular: Normal heart size. No significant pericardial effusion/thickening. Left anterior descending and left circumflex coronary atherosclerosis. Right internal jugular Port-A-Cath terminates at the cavoatrial junction. Great vessels are normal in course and caliber. Mediastinum/Nodes: Subcentimeter partially calcified right thyroid nodule is stable. Not clinically significant; no follow-up imaging recommended (ref: J Am Coll Radiol. 2015 Feb;12(2): 143-50). Unremarkable esophagus. No axillary adenopathy. No pathologically enlarged mediastinal or discrete hilar nodes on these noncontrast images. Lungs/Pleura: No pneumothorax. Small dependent bilateral pleural effusions, left greater than right. Moderate centrilobular and paraseptal emphysema. Irregular solid anterior left lower lobe 6.2 x 5.4 cm lung mass crossing the left major fissure into the lingula (series 10/image 93), previously 6.1 x 5.8 cm on 06/04/2021 PET-CT, not substantially changed. New mild to moderate patchy consolidation and volume loss in the dependent lower lobes bilaterally. No new significant  pulmonary nodules. Background extensive patchy confluent subpleural reticulation and ground-glass opacity with associated moderate traction bronchiectasis, architectural distortion and volume loss. Mild basilar predominance to these findings. Widespread moderate honeycombing in both lungs, most prominent at the peripheral right lung base. No definite interval progression of the underlying fibrotic interstitial lung disease since 03/23/2021. No significant lobular air trapping or definite tracheobronchomalacia on the expiration sequence. Upper abdomen: Simple 2.5 cm lateral upper left renal cyst. Musculoskeletal: No aggressive appearing focal osseous lesions. Mild-to-moderate thoracic spondylosis. IMPRESSION: 1. Irregular solid anterior left lower lobe 6.2 x 5.4 cm lung mass crossing the left major fissure into the lingula, not substantially changed since 06/04/2021 PET-CT, compatible with known primary bronchogenic carcinoma. 2. New mild to moderate patchy consolidation and volume loss in the dependent lower lobes bilaterally, favor a combination of atelectasis and pneumonia. 3. Small dependent bilateral pleural effusions, left greater than right. 4. Background extensive basilar predominant fibrotic interstitial lung disease with moderate honeycombing. No definite interval progression of the underlying fibrotic interstitial lung disease since 03/23/2021 chest CT. Findings are consistent with UIP per consensus guidelines: Diagnosis of Idiopathic Pulmonary Fibrosis: An Official ATS/ERS/JRS/ALAT Clinical Practice Guideline. [place], Iss 5, [id number], Mar 07 2017. 5. Two-vessel coronary atherosclerosis. 6. Aortic Atherosclerosis (ICD10-I70.0) and Emphysema (ICD10-J43.9). Electronically Signed   By: Ilona Sorrel M.D.   On: 07/04/2021 20:40   DG Chest Port 1 View  Result Date: 07/04/2021 CLINICAL DATA:  Pulmonary edema. EXAM: PORTABLE CHEST 1 VIEW COMPARISON:  July 03, 2021. FINDINGS: The  heart size and mediastinal contours are within normal limits. Right internal jugular Port-A-Cath is unchanged in position. Stable bilateral lung opacities are noted concerning for fibrosis and probable acute infiltrates. The visualized skeletal structures are unremarkable. IMPRESSION: Stable bilateral opacities as described above. Electronically Signed   By: Marijo Conception M.D.   On: 07/04/2021 08:08   ECHOCARDIOGRAM COMPLETE  Result Date: 07/04/2021    ECHOCARDIOGRAM REPORT   Patient Name:   Molly Welch Date  of Exam: 07/04/2021 Medical Rec #:  [id number]        Height:       62.0 in Accession #:    [id number]       Weight:       125.2 lb Date of Birth:  [birth year]         BSA:          1.567 m Patient Age:    89 years         BP:           106/46 mmHg Patient Gender: F                HR:           96 bpm. Exam Location:  Inpatient Procedure: 2D Echo, 3D Echo, Cardiac Doppler, Color Doppler and Strain Analysis Indications:    Shock, Chemo  History:        Patient has no prior history of Echocardiogram examinations.  Sonographer:    Glo Herring Referring Phys: [id number] RAHUL P DESAI IMPRESSIONS  1. Left ventricular ejection fraction, by estimation, is 55 to 60%. The left ventricle has normal function. The left ventricle has no regional wall motion abnormalities. There is mild concentric left ventricular hypertrophy. Left ventricular diastolic parameters are consistent with Grade I diastolic dysfunction (impaired relaxation).  2. Right ventricular systolic function is normal. The right ventricular size is normal. There is normal pulmonary artery systolic pressure.  3. The mitral valve is normal in structure. Trivial mitral valve regurgitation. No evidence of mitral stenosis.  4. The aortic valve was not well visualized. Aortic valve regurgitation is not visualized. No aortic stenosis is present.  5. The inferior vena cava is normal in size with greater than 50% respiratory variability, suggesting right  atrial pressure of 3 mmHg. FINDINGS  Left Ventricle: Left ventricular ejection fraction, by estimation, is 55 to 60%. The left ventricle has normal function. The left ventricle has no regional wall motion abnormalities. The left ventricular internal cavity size was normal in size. There is  mild concentric left ventricular hypertrophy. Left ventricular diastolic parameters are consistent with Grade I diastolic dysfunction (impaired relaxation). Indeterminate filling pressures. Right Ventricle: The right ventricular size is normal. No increase in right ventricular wall thickness. Right ventricular systolic function is normal. There is normal pulmonary artery systolic pressure. The tricuspid regurgitant velocity is 2.58 m/s, and  with an assumed right atrial pressure of 3 mmHg, the estimated right ventricular systolic pressure is 27.0 mmHg. Left Atrium: Left atrial size was normal in size. Right Atrium: Right atrial size was normal in size. Pericardium: There is no evidence of pericardial effusion. Mitral Valve: The mitral valve is normal in structure. Trivial mitral valve regurgitation. No evidence of mitral valve stenosis. Tricuspid Valve: The tricuspid valve is normal in structure. Tricuspid valve regurgitation is trivial. No evidence of tricuspid stenosis. Aortic Valve: The aortic valve was not well visualized. Aortic valve regurgitation is not visualized. No aortic stenosis is present. Aortic valve mean gradient measures 7.0 mmHg. Aortic valve peak gradient measures 13.4 mmHg. Aortic valve area, by VTI measures 2.05 cm. Pulmonic Valve: The pulmonic valve was normal in structure. Pulmonic valve regurgitation is not visualized. No evidence of pulmonic stenosis. Aorta: The aortic root is normal in size and structure. Venous: The inferior vena cava is normal in size with greater than 50% respiratory variability, suggesting right atrial pressure of 3 mmHg. IAS/Shunts: No atrial level shunt detected by color flow  Doppler.  LEFT VENTRICLE PLAX 2D LVIDd:         3.90 cm   Diastology LVIDs:         2.50 cm   LV e' medial:    5.66 cm/s LV PW:         1.10 cm   LV E/e' medial:  13.6 LV IVS:        1.10 cm   LV e' lateral:   7.29 cm/s LVOT diam:     1.90 cm   LV E/e' lateral: 10.6 LV SV:         68 LV SV Index:   44 LVOT Area:     2.84 cm                           3D Volume EF:                          3D EF:        57 %                          LV EDV:       97 ml                          LV ESV:       42 ml                          LV SV:        56 ml RIGHT VENTRICLE             IVC RV Basal diam:  3.00 cm     IVC diam: 1.80 cm RV S prime:     15.10 cm/s LEFT ATRIUM             Index        RIGHT ATRIUM          Index LA diam:        2.40 cm 1.53 cm/m   RA Area:     8.35 cm LA Vol (A2C):   34.6 ml 22.08 ml/m  RA Volume:   13.50 ml 8.62 ml/m LA Vol (A4C):   24.7 ml 15.77 ml/m LA Biplane Vol: 31.8 ml 20.30 ml/m  AORTIC VALVE                     PULMONIC VALVE AV Area (Vmax):    2.12 cm      PV Vmax:       1.07 m/s AV Area (Vmean):   1.98 cm      PV Peak grad:  4.6 mmHg AV Area (VTI):     2.05 cm AV Vmax:           183.00 cm/s AV Vmean:          126.000 cm/s AV VTI:            0.334 m AV Peak Grad:      13.4 mmHg AV Mean Grad:      7.0 mmHg LVOT Vmax:         137.00 cm/s LVOT Vmean:        87.900 cm/s LVOT VTI:          0.241 m LVOT/AV VTI ratio: 0.72  AORTA Ao Root diam: 2.80  cm Ao Asc diam:  3.20 cm MITRAL VALVE               TRICUSPID VALVE MV Area (PHT): 4.49 cm    TR Peak grad:   26.6 mmHg MV Decel Time: 169 msec    TR Vmax:        258.00 cm/s MV E velocity: 77.10 cm/s MV A velocity: 78.80 cm/s  SHUNTS MV E/A ratio:  0.98        Systemic VTI:  0.24 m                            Systemic Diam: 1.90 cm Skeet Latch MD Electronically signed by Skeet Latch MD Signature Date/Time: 07/04/2021/1:54:02 PM    Final

## 2021-07-05 NOTE — Evaluation (Signed)
Occupational Therapy Evaluation Patient Details Name: Molly Welch MRN: [id number] DOB: [birth year] Today's Date: 07/05/2021   History of Present Illness Pt is a 75 year old woman admitted with septic PNA, acute respiratory failure and cardiogenic shock on 07/03/21 in the setting of chemotherapy for lung cancer. Requires pressors. PMH: former smoker, anxiety depression.   Clinical Impression   Pt was ambulating with a rollator and assisted for showering (in sitting), dressing and all IADL. Pt could toilet, groom and self feed. Pt presents with decreased activity tolerance, generalized weakness and impaired standing balance. She requires set up to max assist for ADL and min assist for OOB with RW with second person for lines/safety. 4L 02 utilized throughout session. Pt with heavy grip on walker, difficult to accurately assess 02 sat during activity. Will follow acutely. Anticipate pt will be able to go home with her supportive daughter who is currently living with pt.      Recommendations for follow up therapy are one component of a multi-disciplinary discharge planning process, led by the attending physician.  Recommendations may be updated based on patient status, additional functional criteria and insurance authorization.   Follow Up Recommendations  Home health OT    Assistance Recommended at Discharge Frequent or constant Supervision/Assistance  Functional Status Assessment  Patient has had a recent decline in their functional status and demonstrates the ability to make significant improvements in function in a reasonable and predictable amount of time.  Equipment Recommendations  BSC/3in1    Recommendations for Other Services       Precautions / Restrictions Precautions Precautions: Fall;Other (comment) Precaution Comments: chemo precautions      Mobility Bed Mobility Overal bed mobility: Needs Assistance Bed Mobility: Supine to Sit     Supine to sit: Supervision      General bed mobility comments: increased time, HOB up, +rail    Transfers Overall transfer level: Needs assistance Equipment used: Rolling walker (2 wheels) Transfers: Sit to/from Stand Sit to Stand: Min assist;+2 safety/equipment           General transfer comment: cues for hand placement, assist to rise and steady      Balance Overall balance assessment: Needs assistance   Sitting balance-Leahy Scale: Fair     Standing balance support: Bilateral upper extremity supported Standing balance-Leahy Scale: Poor Standing balance comment: reliant on B UE support and min assist                           ADL either performed or assessed with clinical judgement   ADL Overall ADL's : Needs assistance/impaired Eating/Feeding: Set up;Sitting   Grooming: Set up;Sitting   Upper Body Bathing: Minimal assistance;Sitting   Lower Body Bathing: Maximal assistance;Sit to/from stand   Upper Body Dressing : Minimal assistance;Sitting   Lower Body Dressing: Maximal assistance;Sit to/from stand   Toilet Transfer: Minimal assistance;Stand-pivot;BSC/3in1;Rolling walker (2 wheels)   Toileting- Clothing Manipulation and Hygiene: Total assistance;Sit to/from stand       Functional mobility during ADLs: Minimal assistance;Rolling walker (2 wheels);+2 for safety/equipment       Vision Ability to See in Adequate Light: 0 Adequate Patient Visual Report: No change from baseline       Perception     Praxis      Pertinent Vitals/Pain Pain Assessment: No/denies pain     Hand Dominance Right   Extremity/Trunk Assessment Upper Extremity Assessment Upper Extremity Assessment: Overall WFL for tasks assessed   Lower Extremity  Assessment Lower Extremity Assessment: Defer to PT evaluation       Communication Communication Communication: No difficulties   Cognition Arousal/Alertness: Awake/alert Behavior During Therapy: WFL for tasks assessed/performed Overall  Cognitive Status: Within Functional Limits for tasks assessed                                       General Comments       Exercises     Shoulder Instructions      Home Living Family/patient expects to be discharged to:: Private residence Living Arrangements: Children Available Help at Discharge: Family;Available 24 hours/day Type of Home: House Home Access: Stairs to enter [employer] of Steps: 5 Entrance Stairs-Rails: Left Home Layout: One level     Bathroom Shower/Tub: Teacher, early years/pre: Standard     Home Equipment: BSC/3in1;Rollator (4 wheels);Cane - single point;Shower seat          Prior Functioning/Environment Prior Level of Function : Needs assist             Mobility Comments: walking with rollator ADLs Comments: assisted with bathing, dressing and IADL, pt takes herself to the bathroom and grooms        OT Problem List: Decreased strength;Decreased activity tolerance;Impaired balance (sitting and/or standing);Decreased knowledge of use of DME or AE;Cardiopulmonary status limiting activity      OT Treatment/Interventions: Self-care/ADL training;Energy conservation;DME and/or AE instruction;Therapeutic activities;Patient/family education;Balance training    OT Goals(Current goals can be found in the care plan section) Acute Rehab OT Goals OT Goal Formulation: With patient Time For Goal Achievement: 07/19/21 Potential to Achieve Goals: Good ADL Goals Pt Will Perform Grooming: with min guard assist;standing Pt Will Perform Upper Body Dressing: with set-up;sitting Pt Will Perform Lower Body Dressing: with min assist;sit to/from stand Pt Will Transfer to Toilet: with min guard assist;ambulating;bedside commode (over toilet) Pt Will Perform Toileting - Clothing Manipulation and hygiene: with min guard assist;sit to/from stand Additional ADL Goal #1: Pt will employ pursed lip breathing and energy conservation  strategies in ADL.  OT Frequency: Min 2X/week   Barriers to D/C:            Co-evaluation PT/OT/SLP Co-Evaluation/Treatment: Yes Reason for Co-Treatment: For patient/therapist safety   OT goals addressed during session: ADL's and self-care      AM-PAC OT "6 Clicks" Daily Activity     Outcome Measure Help from another person eating meals?: A Little Help from another person taking care of personal grooming?: A Little Help from another person toileting, which includes using toliet, bedpan, or urinal?: A Lot Help from another person bathing (including washing, rinsing, drying)?: A Little Help from another person to put on and taking off regular upper body clothing?: A Lot Help from another person to put on and taking off regular lower body clothing?: A Lot 6 Click Score: 15   End of Session Equipment Utilized During Treatment: Rolling walker (2 wheels);Oxygen (4L) Nurse Communication: Mobility status;Other (comment) (urinated in [place], had BM)  Activity Tolerance: Patient tolerated treatment well Patient left: in chair;with call bell/phone within reach;with nursing/sitter in room  OT Visit Diagnosis: Unsteadiness on feet (R26.81);Other abnormalities of gait and mobility (R26.89);Muscle weakness (generalized) (M62.81);Other (comment) (decreased activity tolerance)                Time: 9622-2979 OT Time Calculation (min): 32 min Charges:  OT General Charges $OT Visit: 1 Visit  OT Evaluation $OT Eval Moderate Complexity: 1 Mod  Nestor Lewandowsky, OTR/L Acute Rehabilitation Services Pager: [phone] Office: [phone]  Malka So 07/05/2021, 1:13 PM

## 2021-07-05 NOTE — Progress Notes (Signed)
Phos 1.8 Replaced per protocol

## 2021-07-05 NOTE — Progress Notes (Signed)
° °  Urine strep - neg Stil on pressors Cortisol 14  Plan  - start hydrocort - start midodrine - hopefully can come off pressors    SIGNATURE    Dr. Brand Males, M.D., F.C.C.P,  Pulmonary and Critical Care Medicine Staff Physician, [place] Director - Interstitial Lung Disease  Program  Pulmonary [place] [facility] [place], [place], [address]  NPI Number:  NPI #[id number]  Pager: [phone], If no answer  -> Check AMION or Try [phone] Telephone (clinical office): [phone] Telephone (research): [phone]  6:09 PM 07/05/2021

## 2021-07-05 NOTE — Evaluation (Signed)
Physical Therapy Evaluation Patient Details Name: Molly Welch MRN: [id number] DOB: [birth year] Today's Date: 07/05/2021  History of Present Illness  Pt is a 75 year old woman admitted with septic PNA, acute respiratory failure and cardiogenic shock on 07/03/21 in the setting of chemotherapy for lung cancer. Requires pressors. PMH: former smoker, anxiety depression.  Clinical Impression  Pt admitted as above and presenting with functional mobility limitations 2* generalized weakness, balance deficits and limited endurance.  Pt hopes to progress to dc home with family assist and would benefit from follow up HHPT to further address deficits and maximize IND and safety.     Recommendations for follow up therapy are one component of a multi-disciplinary discharge planning process, led by the attending physician.  Recommendations may be updated based on patient status, additional functional criteria and insurance authorization.  Follow Up Recommendations Home health PT    Assistance Recommended at Discharge Frequent or constant Supervision/Assistance  Functional Status Assessment Patient has had a recent decline in their functional status and demonstrates the ability to make significant improvements in function in a reasonable and predictable amount of time.  Equipment Recommendations  None recommended by PT    Recommendations for Other Services       Precautions / Restrictions Precautions Precautions: Fall;Other (comment) Precaution Comments: chemo precautions Restrictions Weight Bearing Restrictions: No      Mobility  Bed Mobility Overal bed mobility: Needs Assistance Bed Mobility: Supine to Sit     Supine to sit: Supervision     General bed mobility comments: increased time, HOB up, +rail    Transfers Overall transfer level: Needs assistance Equipment used: Rolling walker (2 wheels) Transfers: Sit to/from Stand;Bed to chair/wheelchair/BSC Sit to Stand: Min assist;+2  safety/equipment   Step pivot transfers: Min assist       General transfer comment: cues for hand placement, assist to rise and steady    Ambulation/Gait Ambulation/Gait assistance: Min assist Gait Distance (Feet): 100 Feet Assistive device: Rolling walker (2 wheels) Gait Pattern/deviations: Step-to pattern;Step-through pattern;Decreased step length - right;Decreased step length - left;Shuffle;Trunk flexed       General Gait Details: cues for posture and position from RW; distance ltd by fatigue  Stairs            Wheelchair Mobility    Modified Rankin (Stroke Patients Only)       Balance Overall balance assessment: Needs assistance   Sitting balance-Leahy Scale: Fair     Standing balance support: Bilateral upper extremity supported Standing balance-Leahy Scale: Poor Standing balance comment: reliant on B UE support and min assist                             Pertinent Vitals/Pain Pain Assessment: No/denies pain    Home Living Family/patient expects to be discharged to:: Private residence Living Arrangements: Children Available Help at Discharge: Family;Available 24 hours/day Type of Home: House Home Access: Stairs to enter Entrance Stairs-Rails: Left Entrance Stairs-Number of Steps: 5   Home Layout: One level Home Equipment: BSC/3in1;Rollator (4 wheels);Cane - single point;Shower seat      Prior Function Prior Level of Function : Needs assist             Mobility Comments: walking with rollator ADLs Comments: assisted with bathing, dressing and IADL, pt takes herself to the bathroom and grooms     Hand Dominance   Dominant Hand: Right    Extremity/Trunk Assessment   Upper Extremity Assessment  Upper Extremity Assessment: Overall WFL for tasks assessed    Lower Extremity Assessment Lower Extremity Assessment: Generalized weakness    Cervical / Trunk Assessment Cervical / Trunk Assessment: Kyphotic  Communication    Communication: No difficulties  Cognition Arousal/Alertness: Awake/alert Behavior During Therapy: WFL for tasks assessed/performed Overall Cognitive Status: Within Functional Limits for tasks assessed                                          General Comments      Exercises     Assessment/Plan    PT Assessment Patient needs continued PT services  PT Problem List Decreased strength;Decreased activity tolerance;Decreased balance;Decreased mobility;Decreased knowledge of use of DME       PT Treatment Interventions DME instruction;Gait training;Stair training;Functional mobility training;Therapeutic activities;Therapeutic exercise;Balance training;Patient/family education    PT Goals (Current goals can be found in the Care Plan section)  Acute Rehab PT Goals Patient Stated Goal: HOME PT Goal Formulation: With patient Time For Goal Achievement: 07/18/21 Potential to Achieve Goals: Good    Frequency Min 3X/week   Barriers to discharge        Co-evaluation PT/OT/SLP Co-Evaluation/Treatment: Yes Reason for Co-Treatment: For patient/therapist safety PT goals addressed during session: Mobility/safety with mobility OT goals addressed during session: ADL's and self-care       AM-PAC PT "6 Clicks" Mobility  Outcome Measure Help needed turning from your back to your side while in a flat bed without using bedrails?: A Little Help needed moving from lying on your back to sitting on the side of a flat bed without using bedrails?: A Little Help needed moving to and from a bed to a chair (including a wheelchair)?: A Little Help needed standing up from a chair using your arms (e.g., wheelchair or bedside chair)?: A Little Help needed to walk in hospital room?: A Little Help needed climbing 3-5 steps with a railing? : A Lot 6 Click Score: 17    End of Session Equipment Utilized During Treatment: Gait belt;Oxygen Activity Tolerance: Patient tolerated treatment  well;Patient limited by fatigue Patient left: in chair;with call bell/phone within reach;with nursing/sitter in room Nurse Communication: Mobility status PT Visit Diagnosis: Unsteadiness on feet (R26.81);Muscle weakness (generalized) (M62.81);Difficulty in walking, not elsewhere classified (R26.2)    Time: 7510-2585 PT Time Calculation (min) (ACUTE ONLY): 33 min   Charges:   PT Evaluation $PT Eval Moderate Complexity: [facility] PT Acute Rehabilitation Services Pager [phone] Office [phone]   Dorianna Mckiver 07/05/2021, 2:30 PM

## 2021-07-05 NOTE — Progress Notes (Signed)
So far, cultures have all been negative.  She still is on IV antibiotics.  She had a CT scan of the chest done yesterday.  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  She has evidence of some COPD.  She does have the mass in the lung which is unchanged.  Her blood pressure is doing much better.  Hopefully she can come off the norepinephrine drip.  It is hard to say how much she really is eating right now.  She is not having any diarrhea.  Her labs show a sodium 136.  Potassium 4.5.  Blood sugar is 111.  Her BUN is 27 creatinine 0.62.  Calcium is 8.4.  Her phosphorus was on the low side at 1.8.  Her white cell count was 12.5.  Hemoglobin 10.7.  Platelet count 165,000.  She does seem to be a little bit better.  She seems to be a little bit stronger.  I know she has a long way to go.  Her lactic acid has come down to 1.4.  I am not sure we can be able to do any more chemotherapy with her given her status.  We may have to switch over to radiation therapy for the lung primary.  Again I have to worry about the possibility of her having some issues with dysphagia and odynophagia.  I know she is getting outstanding care in the ICU.  Hopefully, she will be off the pressor at some point today.  We will continue to pray hard for her.  As always, we had a very good prayer today.  Lattie Haw, MD  Psalm 32:8

## 2021-07-06 DIAGNOSIS — R509 Fever, unspecified: Secondary | ICD-10-CM | POA: Diagnosis not present

## 2021-07-06 DIAGNOSIS — J189 Pneumonia, unspecified organism: Secondary | ICD-10-CM | POA: Diagnosis not present

## 2021-07-06 DIAGNOSIS — J84112 Idiopathic pulmonary fibrosis: Secondary | ICD-10-CM | POA: Diagnosis present

## 2021-07-06 DIAGNOSIS — R682 Dry mouth, unspecified: Secondary | ICD-10-CM

## 2021-07-06 DIAGNOSIS — A419 Sepsis, unspecified organism: Secondary | ICD-10-CM | POA: Diagnosis not present

## 2021-07-06 DIAGNOSIS — R531 Weakness: Secondary | ICD-10-CM | POA: Diagnosis not present

## 2021-07-06 LAB — CBC
HCT: 32.4 % — ABNORMAL LOW (ref 36.0–46.0)
Hemoglobin: 10.4 g/dL — ABNORMAL LOW (ref 12.0–15.0)
MCH: 28.6 pg (ref 26.0–34.0)
MCHC: 32.1 g/dL (ref 30.0–36.0)
MCV: 89 fL (ref 80.0–100.0)
Platelets: 149 10*3/uL — ABNORMAL LOW (ref 150–400)
RBC: 3.64 MIL/uL — ABNORMAL LOW (ref 3.87–5.11)
RDW: 18.5 % — ABNORMAL HIGH (ref 11.5–15.5)
WBC: 6.6 10*3/uL (ref 4.0–10.5)
nRBC: 0 % (ref 0.0–0.2)

## 2021-07-06 LAB — RESPIRATORY PANEL BY PCR

## 2021-07-06 LAB — BASIC METABOLIC PANEL
Anion gap: 7 (ref 5–15)
BUN: 21 mg/dL (ref 8–23)
CO2: 27 mmol/L (ref 22–32)
Calcium: 8.6 mg/dL — ABNORMAL LOW (ref 8.9–10.3)
Chloride: 104 mmol/L (ref 98–111)
Creatinine, Ser: 0.62 mg/dL (ref 0.44–1.00)
GFR, Estimated: >60 mL/min (ref >60)
Glucose, Bld: 116 mg/dL — ABNORMAL HIGH (ref 70–99)
Potassium: 4.1 mmol/L (ref 3.5–5.1)
Sodium: 138 mmol/L (ref 135–145)

## 2021-07-06 LAB — MAGNESIUM: Magnesium: 2 mg/dL (ref 1.7–2.4)

## 2021-07-06 LAB — ANTI-SCLERODERMA ANTIBODY: Scleroderma (Scl-70) (ENA) Antibody, IgG: <0.2 AI (ref 0.0–0.9)

## 2021-07-06 LAB — CORTISOL: Cortisol, Plasma: 25.5 ug/dL

## 2021-07-06 LAB — RHEUMATOID FACTOR: Rheumatoid fact SerPl-aCnc: 86.4 IU/mL — ABNORMAL HIGH (ref <14.0)

## 2021-07-06 LAB — ANTI-DNA ANTIBODY, DOUBLE-STRANDED: ds DNA Ab: 1 IU/mL (ref 0–9)

## 2021-07-06 LAB — SJOGRENS SYNDROME-B EXTRACTABLE NUCLEAR ANTIBODY: SSB (La) (ENA) Antibody, IgG: <0.2 AI (ref 0.0–0.9)

## 2021-07-06 LAB — SJOGRENS SYNDROME-A EXTRACTABLE NUCLEAR ANTIBODY: SSA (Ro) (ENA) Antibody, IgG: <0.2 AI (ref 0.0–0.9)

## 2021-07-06 LAB — PHOSPHORUS: Phosphorus: 2.9 mg/dL (ref 2.5–4.6)

## 2021-07-06 MED ORDER — SODIUM BICARBONATE/SODIUM CHLORIDE MOUTHWASH
OROMUCOSAL | Status: DC
  Administered 2021-07-08 – 2021-07-11 (×8): 1 via OROMUCOSAL
  Filled 2021-07-06 (×5): qty 1000

## 2021-07-06 MED ORDER — IPRATROPIUM-ALBUTEROL 0.5-2.5 (3) MG/3ML IN SOLN
3.0000 mL | Freq: Two times a day (BID) | RESPIRATORY_TRACT | Status: DC
  Administered 2021-07-07 – 2021-07-11 (×9): 3 mL via RESPIRATORY_TRACT
  Filled 2021-07-06 (×8): qty 3

## 2021-07-06 MED ORDER — CHLORHEXIDINE GLUCONATE 0.12 % MT SOLN
15.0000 mL | Freq: Four times a day (QID) | OROMUCOSAL | Status: DC
  Administered 2021-07-06 – 2021-07-11 (×21): 15 mL via OROMUCOSAL
  Filled 2021-07-06 (×16): qty 15

## 2021-07-06 MED ORDER — FAMOTIDINE 20 MG PO TABS
20.0000 mg | ORAL_TABLET | Freq: Every day | ORAL | Status: DC
  Administered 2021-07-06 – 2021-07-10 (×5): 20 mg via ORAL
  Filled 2021-07-06 (×5): qty 1

## 2021-07-06 MED ORDER — LACTATED RINGERS IV BOLUS
500.0000 mL | Freq: Once | INTRAVENOUS | Status: AC
  Administered 2021-07-06: 500 mL via INTRAVENOUS

## 2021-07-06 NOTE — Progress Notes (Addendum)
NAME:  Molly Welch, MRN:  [id number], DOB:  [birth year], LOS: 3 ADMISSION DATE:  07/02/2021, CONSULTATION DATE:  07/03/21 REFERRING MD:  Dr. Memory Dance, CHIEF COMPLAINT:  Weakness/malaise   BRIEF  This 75 y.o. female with a PMH significant for Stage 3B lung cancer, tobacco abuse, anxiety, and depression who presented to the [facility] Emergency Department via EMS with complaints of weakness and malaise after undergoing chemotherapy earlier in the day.  In the emergency department, the patient was found to be hypotensive and febrile.  She had a fever of 103 F at home.  She had increasing oxygen requirement and is currently on 100% nonrebreather.  She has been started on norepinephrine for blood pressure support.  Lactate was elevated.  She was started on cefepime/azithromycin.   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  She is currently on carboplatin/Taxol/Yervoy/Opdivo.   ILD hx elicited 81/15/72  =-  Daughter tells me that some 2 years ago a chest x-ray was done and patient/daughter was informed she has pulmonary fibrosis but they did nothing about it. .  Patient's continued to smoke back then (at this point she has quit) and had a cough.    Over the last 2 years the cough has gotten worse but they attribute this to smoking.  She never had shortness of breath.  Then in the last 1 year she started having gait issues.  September 2022 diagnosed with lung cancer and started therapy for lung cancer.  After this the gait improved.  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  Nevertheless she was using a walker  Pertinent  Medical History  Stage 3B lung cancer, tobacco abuse, anxiety, and depression  Significant Hospital Events: Including procedures, antibiotic start and stop dates in addition to other pertinent events   12/28 admitted for ? Septic shock and acute hypoxic respiratory failure  12/29 Oxygen demand and  pressor support significantly improved.   12/30 -afebrile since admission 06/25/2021.  White count is slightly better 12,500.  Cultures are negative so far she is on oxygen 2 L nasal cannula.  She continues to be on Levophed is on antibiotics.  Procalcitonin is improved to 12.8.  Her lactic acidosis resolved. 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  Although radiologist did notice stability in the fibrosis itself.  She also has associated moderate level of emphysema.  No ongoing left lower lobe lung mass 6.2 cm is unchanged.  The new issue is that she has patchy consolidation in the lower lobes bilaterally, personal visualization infectious process of bacterial pneumonia consistent with a fever, hypoxemia, high procalcitonin, pulmonary infiltrates that are new -Respiratory virus panel is negative multiplex Urine Streptococcus is negative  Interim History / Subjective:   12/31 -she remains on 4 L nasal cannula and Levophed infusion 2.5 mcg/min..  Started on midodrine and hydrocortisone yesterday but still needing Levophed.  She has been afebrile since admission.  Blood pressure shows low diastolic.  Also reporting new onset diarrhea since yesterday but no fever no white count elevation.  No abdominal pain.  Objective   Blood pressure 111/62, pulse 84, temperature (!) 97.2 F (36.2 C), temperature source Axillary, resp. rate (!) 24, height 5' 2"  (1.575 m), weight 57 kg, SpO2 94 %.        Intake/Output Summary (Last 24 hours) at 07/06/2021 1214 Last data filed at  07/06/2021 0634 Gross per 24 hour  Intake 793.9 ml  Output 1700 ml  Net -906.1 ml   Filed Weights   07/02/21 2356 07/04/21 0500 07/05/21 0435  Weight: 48.5 kg 56.8 kg 57 kg    Thin cachectic female with a right-sided Port-A-Cath.  Has alopecia.  Sitting on the bed.  Family daughter Lynelle Smoke and granddaughter Rachel Moulds visiting her.  Abdomen soft clear to auscultation except in  the bases Velcro crackles normal heart sounds no sinus no clubbing no edema no rash.  Diastolic appears low.     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  Natural history of IPF is that it is a progressive disease with average life expectancy of few to several years.  She currently does not have IPF flareup.  She is at extreme high risk for flareup even this admission and definite progression given recent chemotherapy and proposed radiation and current pneumonia.  Antifibrotic's are indicated but she will definitely have GI side effects given her frail situation.  07/06/2021: Has finished ILD questionnaire.  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 Likely CAP .  In the differential diagnosis is immunomodulator related ILD flareup pneumonitis  07/06/2021- afebrole, WBC and PCT down.  Respiratory virus panel negative.  Urine Legionella pending.  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  07/06/2021 -still on low-dose Levophed.  Appears she has lower diastolic pressure.  She is on hydrocortisone and midodrine P: Continue pressors for MAP goal >09 with systolic blood pressure goal greater than 100 -LR bolus 500 cc x 1 -Continue midodrine - Continue hydrocortisone   New onset diarrhea 07/05/2021 -no evidence of infection but she is immunocompromised  Plan - Change PPI to H2 blockade - DC azithromycin - DC all laxatives - Check stool PCR for GI pathogens  -Try to avoid antidiarrheals till some work-up is resulted   Anemia of chronic disease - prsent at admit  07/06/2021 - stable  P: - PRBC for hgb </= 6.9gm%    - exceptions are   -  if ACS susepcted/confirmed then transfuse for hgb </= 8.0gm%,  or    -  active bleeding with  hemodynamic instability, then transfuse regardless of hemoglobin value   At at all times try to transfuse 1 unit prbc as possible with exception of active hemorrhage  Frail and physical deconditioning - Prior to & Present on Admit  Plan  - inhouse PT - daughte wants her home    Best Practice (right click and "Reselect all SmartList Selections" daily)   Diet/type: Regular consistency (see orders) DVT prophylaxis: prophylactic heparin  GI prophylaxis: PPI Lines: N/A Foley:  N/A Code Status:  limited Last date of multidisciplinary goals of care discussion:   - Done on 07/05/2021 with the patient and the nurse and separately with the daughter on the telephone: Revert back to DNR but full medical care  -On 07/06/2021 daughter  Lynelle Smoke and Rachel Moulds granddaughter updated at the bedside  [place]   The patient AMMARA RAJ is critically ill with multiple organ systems failure and requires high complexity decision making for assessment and support, frequent evaluation and titration of therapies, application of advanced monitoring technologies and extensive interpretation of multiple databases.   Critical Care Time devoted to patient care services described in this note is  31  Minutes. This time reflects time of care of this signee Dr Brand Males. This critical care time does not reflect procedure time, or teaching time or supervisory time of PA/NP/Med student/Med Resident etc but could involve care discussion time     Dr. Brand Males, M.D., [facility].C.P Pulmonary and Critical Care Medicine Staff Physician [facility] Pager: [phone], If no answer or between  15:00h - 7:00h: call [phone]  07/06/2021 12:14 PM    LABS    PULMONARY Recent Labs  Lab 07/03/21 0522 07/04/21 0354  PHART 7.357 7.373  PCO2ART 34.1 35.6  PO2ART 193* 71.3*  HCO3 18.6* 20.2  O2SAT 99.8 94.5    CBC Recent Labs  Lab 07/04/21 0230 07/05/21 0438 07/06/21 0523  HGB 10.1* 10.7* 10.4*  HCT 31.0* 32.7* 32.4*  WBC 15.1* 12.5* 6.6  PLT 165 165 149*    COAGULATION Recent Labs  Lab 07/02/21 2343  INR 1.0    CARDIAC  No results for input(s): TROPONINI in the last 168 hours. No results for input(s): PROBNP in the last 168 hours.   CHEMISTRY Recent Labs  Lab 07/02/21 0806 07/02/21 2343 07/03/21 0759 07/04/21 0230 07/05/21 0438 07/06/21 0523  NA 136 131*  --  132* 136 138  K 4.1 3.4*  --  4.6 4.5 4.1  CL 98 102  --  107 103 104  CO2 30 20*  --  19* 24 27  GLUCOSE 94 104*  --  130* 111* 116*  BUN 18 20  --  29* 27* 21  CREATININE 0.66 0.94 1.22* 0.75 0.62 0.62  CALCIUM 10.1 8.2*  --  6.9* 8.4* 8.6*  MG  --   --   --  1.9 2.5* 2.0  PHOS   --   --   --   --  1.8* 2.9   Estimated Creatinine Clearance: 49.5 mL/min (by C-G formula based on SCr of 0.62 mg/dL).   LIVER Recent Labs  Lab 07/02/21 0806 07/02/21 2343  AST 18 106*  ALT 11 47*  ALKPHOS 72 62  BILITOT 0.3 0.5  PROT 7.4 6.4*  ALBUMIN 4.0 3.2*  INR  --  1.0     INFECTIOUS Recent Labs  Lab 07/03/21 0759 07/04/21 0230 07/04/21 1128 07/04/21 1622 07/05/21 0435 07/05/21 8841  LATICACIDVEN  --   --  2.9* 2.6* 1.4  --   PROCALCITON 28.16 19.55  --   --   --  12.80     ENDOCRINE CBG (last 3)  No results for input(s): GLUCAP in the last 72 hours.       IMAGING x48h  - image(s) personally visualized  -   highlighted in bold CT Chest High Resolution  Result Date: 07/04/2021 CLINICAL DATA:  Inpatient. Left lung cancer. Interstitial lung disease. Hypoxemia. EXAM: CT CHEST WITHOUT CONTRAST TECHNIQUE: Multidetector CT imaging of the chest was performed following the standard protocol without intravenous contrast. High resolution imaging of the lungs, as well as inspiratory and expiratory imaging, was performed. COMPARISON:  Chest radiograph from earlier today. 06/04/2021 PET-CT. 03/23/2021 chest CT. FINDINGS: Cardiovascular: Normal heart size. No significant pericardial effusion/thickening. Left anterior descending and left circumflex coronary atherosclerosis. Right internal jugular Port-A-Cath terminates at the cavoatrial junction. Great vessels are normal in course and caliber. Mediastinum/Nodes: Subcentimeter partially calcified right thyroid nodule is stable. Not clinically significant; no follow-up imaging recommended (ref: J Am Coll Radiol. 2015 Feb;12(2): 143-50). Unremarkable esophagus. No axillary adenopathy. No pathologically enlarged mediastinal or discrete hilar nodes on these noncontrast images. Lungs/Pleura: No pneumothorax. Small dependent bilateral pleural effusions, left greater than right. Moderate centrilobular and paraseptal emphysema. Irregular  solid anterior left lower lobe 6.2 x 5.4 cm lung mass crossing the left major fissure into the lingula (series 10/image 93), previously 6.1 x 5.8 cm on 06/04/2021 PET-CT, not substantially changed. New mild to moderate patchy consolidation and volume loss in the dependent lower lobes bilaterally. No new significant pulmonary nodules. Background extensive patchy confluent subpleural reticulation and ground-glass opacity with associated moderate traction bronchiectasis, architectural distortion and volume loss. Mild basilar predominance to these findings. Widespread moderate honeycombing in both lungs, most prominent at the peripheral right lung base. No definite interval progression of the underlying fibrotic interstitial lung disease since 03/23/2021. No significant lobular air trapping or definite tracheobronchomalacia on the expiration sequence. Upper abdomen: Simple 2.5 cm lateral upper left renal cyst. Musculoskeletal: No aggressive appearing focal osseous lesions. Mild-to-moderate thoracic spondylosis. IMPRESSION: 1. Irregular solid anterior left lower lobe 6.2 x 5.4 cm lung mass crossing the left major fissure into the lingula, not substantially changed since 06/04/2021 PET-CT, compatible with known primary bronchogenic carcinoma. 2. New mild to moderate patchy consolidation and volume loss in the dependent lower lobes bilaterally, favor a combination of atelectasis and pneumonia. 3. Small dependent bilateral pleural effusions, left greater than right. 4. Background extensive basilar predominant fibrotic interstitial lung disease with moderate honeycombing. No definite interval progression of the underlying fibrotic interstitial lung disease since 03/23/2021 chest CT. Findings are consistent with UIP per consensus guidelines: Diagnosis of Idiopathic Pulmonary Fibrosis: An Official ATS/ERS/JRS/ALAT Clinical Practice Guideline. [place], Iss 5, [phone], Mar 07 2017. 5. Two-vessel  coronary atherosclerosis. 6. Aortic Atherosclerosis (ICD10-I70.0) and Emphysema (ICD10-J43.9). Electronically Signed   By: Ilona Sorrel M.D.   On: 07/04/2021 20:40

## 2021-07-06 NOTE — Progress Notes (Signed)
Pharmacy Antibiotic Note  Molly Welch is a 75 y.o. female admitted on 07/02/2021 with acute hypoxemic respiratory failure.  Patient received chemotherapy on 12/27.  Abnormal chest x-ray with concerns for pneumonia vs pneumonitis from chemo. Pharmacy has been consulted for cefepime dosing.  Patient remains afebrile, WBC down 12.5 > 6.6, LA now wnl.   Plan: Continue azithromycin 500mg  IV q24 hours Continue Cefepime 2g IV q12 hours for CrCl 49.5 mL/min F/u culture data, clinical improvement, renal function, antibiotic length of therapy  Dose adjustments for cefepime likely not needed at this time. Pharmacy will formally sign off of consult but continue to monitor peripherally.   Height: 5\' 2"  (157.5 cm) Weight: 57 kg (125 lb 10.6 oz) IBW/kg (Calculated) : 50.1  Temp (24hrs), Avg:97.7 F (36.5 C), Min:96.9 F (36.1 C), Max:98.4 F (36.9 C)  Recent Labs  Lab 07/02/21 2343 07/03/21 0259 07/03/21 0618 07/03/21 0759 07/04/21 0230 07/04/21 1128 07/04/21 1622 07/05/21 0435 07/05/21 0438 07/06/21 0523  WBC 12.2*  --   --  21.7* 15.1*  --   --   --  12.5* 6.6  CREATININE 0.94  --   --  1.22* 0.75  --   --   --  0.62 0.62  LATICACIDVEN 4.2* 7.5* 5.6*  --   --  2.9* 2.6* 1.4  --   --      Estimated Creatinine Clearance: 49.5 mL/min (by C-G formula based on SCr of 0.62 mg/dL).    Allergies  Allergen Reactions   Ativan [Lorazepam] Other (See Comments)   Doxycycline Swelling   Prednisone Other (See Comments)    Very emotional, crying, angry   Carbamates    Penicillin G Other (See Comments)   Sulfa Antibiotics Other (See Comments)   Penicillins Rash    Has patient had a PCN reaction causing immediate rash, facial/tongue/throat swelling, SOB or lightheadedness with hypotension: yes Has patient had a PCN reaction causing severe rash involving mucus membranes or skin necrosis: no Has patient had a PCN reaction that required hospitalization: no Has patient had a PCN reaction  occurring within the last 10 years: no If all of the above answers are "NO", then may proceed with Cephalosporin use.    Sulfonamide Derivatives Rash    Antimicrobials this admission: Azithromycin 12/28 >>  Cefepime 12/28 >>   Dose adjustments this admission:   Microbiology results: 12/28 BCx: ngtd 12/28 MRSA PCR: not detected 12/30 Resp panel: neg  Thank you for allowing pharmacy to be a part of this patients care.  Dimple Nanas 07/06/2021 11:19 AM

## 2021-07-06 NOTE — Progress Notes (Signed)
Ms. Dombek is still doing okay.  I think she is still on some pressor support.  Hopefully the pressures can be stopped.  She is still on nasal cannula oxygen.  She has a dry mouth.  There is been no bleeding.  I think she may have had an "accident" in the bed with her bowels.  She has had no hemoptysis.  Her labs show sodium 138.  Potassium 4.1.  BUN 21 creatinine 0.62.  White cell count 6.6.  Hemoglobin 10.4.  Platelet count 149,000.  I am not sure as to how much she is really eating.  This is going be a problem.  We may have to check a prealbumin on her.  She has had no fever.  Surprisingly, cultures are still negative.  Her vital signs show temperature 97.6.  Pulse 85.  Blood pressure 132/65.  It is possible that she may have been tried off of pressors and then her blood pressure got down so low that she had to get put back on.  Her lungs sound clear.  Cardiac exam regular rate and rhythm.  Abdomen is soft.  Bowel sounds are present.  Oral exam does show a dry oral mucosa.  Extremity shows no clubbing, cyanosis or edema.  Neurological exam is nonfocal.  Ms. Kattner appears to have sepsis.  She is improving.  Cultures were all negative.  Hopefully, she will be able to come off the Levophed pressor drip.  Again nutrition can be critical.  I am not sure she will receive any more chemotherapy.  We might have to move to radiation therapy for the lung primary.  Hopefully we can get her mouth a little bit moist.  I know is quite dry and this is making a little bit of her to eat.  I know she still has a lot of anxiety.  I also know that the staff down the ICU are doing a fantastic job with her.  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  Lattie Haw, MD  Darlyn Chamber 17:14

## 2021-07-07 ENCOUNTER — Telehealth: Payer: Self-pay | Admitting: Internal Medicine

## 2021-07-07 LAB — CYCLIC CITRUL PEPTIDE ANTIBODY, IGG/IGA: CCP Antibodies IgG/IgA: 2 units (ref 0–19)

## 2021-07-07 LAB — CBC
HCT: 29 % — ABNORMAL LOW (ref 36.0–46.0)
Hemoglobin: 9.2 g/dL — ABNORMAL LOW (ref 12.0–15.0)
MCH: 29.2 pg (ref 26.0–34.0)
MCHC: 31.7 g/dL (ref 30.0–36.0)
MCV: 92.1 fL (ref 80.0–100.0)
Platelets: 151 10*3/uL (ref 150–400)
RBC: 3.15 MIL/uL — ABNORMAL LOW (ref 3.87–5.11)
RDW: 18.9 % — ABNORMAL HIGH (ref 11.5–15.5)
WBC: 4.6 10*3/uL (ref 4.0–10.5)
nRBC: 0 % (ref 0.0–0.2)

## 2021-07-07 LAB — BASIC METABOLIC PANEL WITH GFR
Anion gap: 7 (ref 5–15)
BUN: 23 mg/dL (ref 8–23)
CO2: 27 mmol/L (ref 22–32)
Calcium: 8.6 mg/dL — ABNORMAL LOW (ref 8.9–10.3)
Chloride: 108 mmol/L (ref 98–111)
Creatinine, Ser: 0.46 mg/dL (ref 0.44–1.00)
GFR, Estimated: >60 mL/min (ref >60)
Glucose, Bld: 92 mg/dL (ref 70–99)
Potassium: 3.7 mmol/L (ref 3.5–5.1)
Sodium: 142 mmol/L (ref 135–145)

## 2021-07-07 LAB — PHOSPHORUS: Phosphorus: 2.1 mg/dL — ABNORMAL LOW (ref 2.5–4.6)

## 2021-07-07 LAB — PREALBUMIN: Prealbumin: 17.1 mg/dL — ABNORMAL LOW (ref 18–38)

## 2021-07-07 LAB — MAGNESIUM: Magnesium: 1.9 mg/dL (ref 1.7–2.4)

## 2021-07-07 MED ORDER — HYDROCORTISONE SOD SUC (PF) 100 MG IJ SOLR
50.0000 mg | Freq: Two times a day (BID) | INTRAMUSCULAR | Status: DC
  Administered 2021-07-07 – 2021-07-09 (×4): 50 mg via INTRAVENOUS
  Filled 2021-07-07: qty 1
  Filled 2021-07-07: qty 2
  Filled 2021-07-07: qty 1
  Filled 2021-07-07: qty 2

## 2021-07-07 MED ORDER — DOCUSATE SODIUM 100 MG PO CAPS
100.0000 mg | ORAL_CAPSULE | Freq: Every day | ORAL | Status: DC | PRN
  Administered 2021-07-07: 100 mg via ORAL
  Filled 2021-07-07: qty 1

## 2021-07-07 MED ORDER — MAGNESIUM SULFATE 2 GM/50ML IV SOLN
2.0000 g | Freq: Once | INTRAVENOUS | Status: AC
  Administered 2021-07-07: 2 g via INTRAVENOUS
  Filled 2021-07-07: qty 50

## 2021-07-07 MED ORDER — POTASSIUM PHOSPHATES 15 MMOLE/5ML IV SOLN
20.0000 mmol | Freq: Once | INTRAVENOUS | Status: AC
  Administered 2021-07-07: 20 mmol via INTRAVENOUS
  Filled 2021-07-07: qty 6.67

## 2021-07-07 MED ORDER — ENOXAPARIN SODIUM 40 MG/0.4ML IJ SOSY
40.0000 mg | PREFILLED_SYRINGE | INTRAMUSCULAR | Status: DC
  Administered 2021-07-07 – 2021-07-11 (×5): 40 mg via SUBCUTANEOUS
  Filled 2021-07-07 (×5): qty 0.4

## 2021-07-07 MED ORDER — ENOXAPARIN SODIUM 40 MG/0.4ML IJ SOSY: 40.0000 mg | PREFILLED_SYRINGE | INTRAMUSCULAR | Status: DC

## 2021-07-07 NOTE — Progress Notes (Addendum)
NAME:  Molly Welch, MRN:  [id number], DOB:  04-Jul-1946, LOS: 4 ADMISSION DATE:  07/02/2021, CONSULTATION DATE:  07/03/21 REFERRING MD:  Dr. Memory Dance, CHIEF COMPLAINT:  Weakness/malaise   BRIEF  This 76 y.o. female with a PMH significant for Stage 3B lung cancer, tobacco abuse, anxiety, and depression who presented to the [facility] Emergency Department via EMS with complaints of weakness and malaise after undergoing chemotherapy earlier in the day.  In the emergency department, the patient was found to be hypotensive and febrile.  She had a fever of 103 F at home.  She had increasing oxygen requirement and is currently on 100% nonrebreather.  She has been started on norepinephrine for blood pressure support.  Lactate was elevated.  She was started on cefepime/azithromycin.   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  She is currently on carboplatin/Taxol/Yervoy/Opdivo.   Pertinent  Medical History  Stage 3B lung cancer, tobacco abuse, anxiety, and depression  ILD questionnaire administered 07/07/2021  [facility] Integrated Comprehensive ILD Questionnaire  Symptoms:   ILD hx elicited 88/89/16  =-  Daughter tells me that some 2 years ago a chest x-ray was done and patient/daughter was informed she has pulmonary fibrosis but they did nothing about it.  Patient's continued to smoke back then (at this point she has quit) and had a cough.   Over the last 2 years the cough has gotten worse but they attribute this to smoking.  She never had shortness of breath.  Then in the last 1 year she started having gait issues.  September 2022 diagnosed with lung cancer and started therapy for lung cancer.  After this the gait improved.  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  Nevertheless she was using a walker  Never needed oxygen to this admission  She has had a COVID-vaccine but never had the COVID disease.  FAMILY  HISTORY of LUNG DISEASE:  -There is no family history of lung disease but she herself, her brother's daughter all have premature graying of the hair.  There is no Scientist, research (physical sciences) like syndrome or albinism.  PERSONAL EXPOSURE HISTORY:  -She smoked cigarettes from 1967 to September 20 20 to 1 pack and then down to half pack and then quit.  Her husband also smokes.  Denies any marijuana use.  Denies cocaine use denies any intravenous drug use.  HOME  EXPOSURE and HOBBY DETAILS :  [place]  The hospital was built in [place].  Detail organic exposure [antigen [elicitation done.  She used to use a feather pillow for about 20 years but no longer.  She did have pet mice and sometimes used a glove to clean.  She did do some gardening but has some damp soil and some rotten wood but this was occasional.  Her mother-in-law did have pet birds in the house and she would visit them several times a week.  OCCUPATIONAL HISTORY (122 questions) :   -In terms of occupation she did do some gardening.  She did do some tobacco leaf growing.  She did have a feather pillows at her house for 20 years..  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  She did use a lawnmower for gardening.  PULMONARY TOXICITY HISTORY (27 items):  She is on cancer chemotherapy that includes Opdivo but this postdated presence of ILD  INVESTIGATIONS: ANA negative in May 2021       [facility] Events: Including procedures, antibiotic start and stop dates in addition to other pertinent events   12/28 admitted for ? Septic shock and acute hypoxic respiratory failure  12/29 Oxygen demand and pressor support significantly improved.   12/30 -afebrile since admission 06/25/2021.  White count is slightly better 12,500.  Cultures are negative so far she is on oxygen 2 L nasal cannula.  She continues to be on Levophed is on antibiotics.  Procalcitonin is improved to  12.8.  Her lactic acidosis resolved. 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  Although radiologist did notice stability in the fibrosis itself.  She also has associated moderate level of emphysema.  No ongoing left lower lobe lung mass 6.2 cm is unchanged.  The new issue is that she has patchy consolidation in the lower lobes bilaterally, personal visualization infectious process of bacterial pneumonia consistent with a fever, hypoxemia, high procalcitonin, pulmonary infiltrates that are new -Respiratory virus panel is negative multiplex Urine Streptococcus is negative 12/31 -she remains on 4 L nasal cannula and Levophed infusion 2.5 mcg/min..  Started on midodrine and hydrocortisone yesterday but still needing Levophed.  She has been afebrile since admission.  Blood pressure shows low diastolic.  Also reporting new onset diarrhea since yesterday but no fever no white count elevation.  No abdominal pain. Stool PCR ILD questionaire  Interim History / Subjective:   07/07/21 -white count down to 4.6.  T-max 99.  She is on 3 L nasal cannula andopulse ox 100%.  ILD questionnaire reviewed.  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  Alternative is that she might have chronic HP given some of her exposures but no air trapping has been reported  Of note her diarrhea is improved after stopping her laxatives.  Objective   Blood pressure (!) 147/69, pulse 93, temperature 98.7 F (37.1 C), temperature source Oral, resp. rate 17, height 5' 2"  (1.575 m), weight 53.3 kg, SpO2 100 %.    FiO2 (%):  [100 %] 100 %   Intake/Output Summary (Last 24 hours) at 07/07/2021 0958 Last data filed at 07/07/2021 0559 Gross per 24 hour  Intake 259.27 ml  Output 425 ml  Net -165.73 ml   Filed Weights   07/04/21 0500 07/05/21 0435 07/07/21 0500  Weight: 56.8 kg 57 kg 53.3 kg     Thin cachectic female sitting in the bed.  Oxygen on 3 L pulse ox 100%.  She looking much better.  Respiratory crackles present at the bases.  Right Port-A-Cath present.  Abdomen soft nontender no organomegaly.  No sinus no clubbing edema moves all 4 extremities.  Overall diffusely emaciated.   Resolved Hospital Problem list     Assessment & Plan:  Chronic lung diseas e- has mixed emphysema with UIP pulmonary fibrosis - Prior to & Present on Admit Not on home oxygen at baseline 07/07/2021: Giving formal diagnosis of IPF on 07/07/2021 For her ILD that first came into recognition 2 years ago according to the daughter.  Plan - Await 2022 serology -To discuss antifibrotic's as an outpatient -follow-up with Dr. Chase Caller the ILD center-but with frailty she may not be able to handle these oral agents - Do bronchodilators and nebulized steroids for emphysema -    Acute Hypoxic Respiratory Failure in the setting of CAP vs  HCAP v chemotherapy pneumonitis  07/07/2021 -pulse ox 100% on 3 L nasal cannula.  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 Likely CAP .  In the differential diagnosis is immunomodulator related ILD flareup pneumonitis  07/07/2021- afebrole, WBC and PCT down.  Respiratory virus panel negative.  Urine Legionella pending.  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  07/07/2021 - She is on hydrocortisone and midodrine.  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 -Prior to & Present on Admit  - [facility] New onset diarrhea 07/05/2021 -no evidence of infection but she is immunocompromised  07/07/2021: Diarrhea improved after stopping laxatives and changing PPI to H2 blockade.  And stopping azithromycin  Plan - cotninue creon - H2 blockade - -Await stool PCR for GI pathogens  -Try to avoid antidiarrheals till some work-up is resulted   Anemia of chronic disease - prsent at admit  07/07/2021 - stable  P: - PRBC for hgb </= 6.9gm%    - exceptions are   -  if ACS susepcted/confirmed then transfuse for hgb </= 8.0gm%,  or    -  active bleeding with hemodynamic instability, then transfuse regardless of hemoglobin value   At at all times try to transfuse 1 unit prbc as possible with exception of active hemorrhage    Hx of short duration hydrocodone in past at home. No hx of chronic use  - Prior to  Admit . (Hx confirmed later by daughter)  07/07/2021 - pain well controlled  Plan  - tramadol - norco prn if needed  On chronic lamictal - Prior to & Present on Admit . Per Chart rview prescribed by [facility]. Has hx of anxiety but no seizures Baseline anxiety - Prior to &  Present on Admit. On xanax at home  07/07/21 - no issues  Plan  - cotninue lamictal] - continue xanaz     Frail and physical deconditioning - Prior to & Present on Admit  Plan  - inhouse PT - daughte wants her home    Best Practice (right click and "Reselect all SmartList Selections" daily)   Diet/type: Regular consistency (see orders) DVT prophylaxis: prophylactic heparin  0=-> change to lovenox 07/07/21 GI prophylaxis: PPI Lines: N/A Foley:  N/A Code Status:  limited Last date of multidisciplinary  goals of care discussion:   - Done on 07/05/2021 with the patient and the nurse and separately with the daughter on the telephone: Revert back to DNR but full medical care. On 07/06/2021 daughter Lynelle Smoke and Rachel Moulds granddaughter updated at the bedside   DIspo: if stable later 07/07/21 can move out of ICU. For 07/08/21 - TRH primary and ccm off/ CCM ILD center appt will be made      SIGNATURE    Dr. Brand Males, M.D., F.C.C.P,  Pulmonary and Critical Care Medicine Staff Physician, [place] Director - Interstitial Lung Disease  Program  Pulmonary [place] at [place], [place], [address]  NPI Number:  NPI #[id number]  Pager: [phone], If no answer  -> Check AMION or Try [phone] Telephone (clinical office): [phone] Telephone (research): [phone]  10:48 AM 07/07/2021   LABS    PULMONARY Recent Labs  Lab 07/03/21 0522 07/04/21 0354  PHART 7.357 7.373  PCO2ART 34.1 35.6  PO2ART 193* 71.3*  HCO3 18.6* 20.2  O2SAT 99.8 94.5    CBC Recent Labs  Lab 07/05/21 0438 07/06/21 0523 07/07/21 0500  HGB 10.7* 10.4* 9.2*  HCT 32.7* 32.4* 29.0*  WBC 12.5* 6.6 4.6  PLT 165 149* 151    COAGULATION Recent Labs  Lab 07/02/21 2343  INR 1.0    CARDIAC  No results for input(s): TROPONINI in the last 168 hours. No results for input(s): PROBNP in the last 168 hours.   CHEMISTRY Recent  Labs  Lab 07/02/21 2343 07/03/21 0759 07/04/21 0230 07/05/21 0438 07/06/21 0523 07/07/21 0500  NA 131*  --  132* 136 138 142  K 3.4*  --  4.6 4.5 4.1 3.7  CL 102  --  107 103 104 108  CO2 20*  --  19* 24 27 27   GLUCOSE 104*  --  130* 111* 116* 92  BUN 20  --  29* 27* 21 23  CREATININE 0.94 1.22* 0.75 0.62 0.62 0.46  CALCIUM 8.2*  --  6.9* 8.4* 8.6* 8.6*  MG  --   --  1.9 2.5* 2.0 1.9  PHOS  --   --   --  1.8* 2.9 2.1*   Estimated Creatinine Clearance: 49.5 mL/min (by C-G formula based on SCr of 0.46 mg/dL).   LIVER Recent Labs  Lab 07/02/21 0806 07/02/21 2343  AST 18 106*  ALT 11 47*  ALKPHOS 72 62  BILITOT 0.3 0.5  PROT 7.4 6.4*  ALBUMIN 4.0 3.2*  INR  --  1.0     INFECTIOUS Recent Labs  Lab 07/03/21 0759 07/04/21 0230 07/04/21 1128 07/04/21 1622 07/05/21 0435 07/05/21 0438  LATICACIDVEN  --   --  2.9* 2.6* 1.4  --   PROCALCITON 28.16 19.55  --   --   --  12.80     ENDOCRINE CBG (last 3)  No results for input(s): GLUCAP in the last 72 hours.       IMAGING x48h  - image(s) personally visualized  -   highlighted in bold No results found.

## 2021-07-07 NOTE — Plan of Care (Signed)
°  Problem: Clinical Measurements: Goal: Ability to maintain clinical measurements within normal limits will improve Outcome: Progressing Goal: Diagnostic test results will improve Outcome: Progressing Goal: Respiratory complications will improve Outcome: Progressing Goal: Cardiovascular complication will be avoided Outcome: Progressing   Cindy S. Brigitte Pulse BSN, RN, [place] 07/07/2021 7:07 AM

## 2021-07-07 NOTE — Progress Notes (Addendum)
Appears tob e weaned down to RA at rest and off pressors  Plan  - move to sdu tonight -> and 07/08/21 can go to medsurg if stable     SIGNATURE    Dr. Brand Males, M.D., F.C.C.P,  Pulmonary and Critical Care Medicine Staff Physician, [place] Director - Interstitial Lung Disease  Program  Pulmonary [place] at [place], [place], [address]  NPI Number:  NPI #[id number]  Pager: [phone], If no answer  -> Check AMION or Try [phone] Telephone (clinical office): [phone] Telephone (research): [phone]  6:44 PM 07/07/2021

## 2021-07-07 NOTE — Progress Notes (Signed)
[facility] Progress Note Patient Name: Molly Welch DOB: 1945/10/20 MRN: [id number]   Date of Service  07/07/2021  HPI/Events of Note  Initial order for Colace was stopped due to some loose stool. Now constipated and requesting the order be placed back  eICU Interventions  PRN colace ordered     Intervention Category Minor Interventions: Routine modifications to care plan (e.g. PRN medications for pain, fever)  Margaretmary Lombard 07/07/2021, 8:33 PM

## 2021-07-07 NOTE — Telephone Encounter (Signed)
Molly Welch/Pulm triage  Molly Welch - give her first  mid-end Jan 2023 with APP for post hospital resp failure an dILD and then end feb 2023 with me - 30 min for IPF  Thanks   SIGNATURE    Dr. Brand Males, M.D., F.C.C.P,  Pulmonary and Critical Care Medicine Staff Physician, [place] Director - Interstitial Lung Disease  Program  Pulmonary [place] at [place], [place], [address]  NPI Number:  NPI #[id number]  Pager: [phone], If no answer  -> Check AMION or Try [place] Telephone (clinical office): [phone] Telephone (research): [phone]  10:43 AM 07/07/2021

## 2021-07-07 NOTE — Plan of Care (Signed)
°  Problem: Education: Goal: Knowledge of General Education information will improve Description: Including pain rating scale, medication(s)/side effects and non-pharmacologic comfort measures Outcome: Progressing   Problem: Health Behavior/Discharge Planning: Goal: Ability to manage health-related needs will improve Outcome: Progressing   Problem: Clinical Measurements: Goal: Ability to maintain clinical measurements within normal limits will improve Outcome: Progressing Goal: Diagnostic test results will improve Outcome: Progressing Goal: Respiratory complications will improve Outcome: Progressing Goal: Cardiovascular complication will be avoided Outcome: Progressing   Problem: Activity: Goal: Risk for activity intolerance will decrease Outcome: Progressing   Problem: Nutrition: Goal: Adequate nutrition will be maintained Outcome: Progressing   Problem: Coping: Goal: Level of anxiety will decrease Outcome: Progressing   Problem: Safety: Goal: Ability to remain free from injury will improve Outcome: Progressing   Molly Welch Pulse BSN, RN, [place] 07/07/2021 11:28 PM

## 2021-07-08 DIAGNOSIS — R531 Weakness: Secondary | ICD-10-CM | POA: Diagnosis not present

## 2021-07-08 DIAGNOSIS — J189 Pneumonia, unspecified organism: Secondary | ICD-10-CM | POA: Diagnosis not present

## 2021-07-08 DIAGNOSIS — A419 Sepsis, unspecified organism: Secondary | ICD-10-CM | POA: Diagnosis not present

## 2021-07-08 DIAGNOSIS — R509 Fever, unspecified: Secondary | ICD-10-CM | POA: Diagnosis not present

## 2021-07-08 LAB — BASIC METABOLIC PANEL
Anion gap: 10 (ref 5–15)
BUN: 18 mg/dL (ref 8–23)
CO2: 24 mmol/L (ref 22–32)
Calcium: 8.9 mg/dL (ref 8.9–10.3)
Chloride: 105 mmol/L (ref 98–111)
Creatinine, Ser: 0.48 mg/dL (ref 0.44–1.00)
GFR, Estimated: >60 mL/min (ref >60)
Glucose, Bld: 81 mg/dL (ref 70–99)
Potassium: 3.1 mmol/L — ABNORMAL LOW (ref 3.5–5.1)
Sodium: 139 mmol/L (ref 135–145)

## 2021-07-08 LAB — CULTURE, BLOOD (ROUTINE X 2)
Culture: NO GROWTH
Culture: NO GROWTH
Special Requests: ADEQUATE
Special Requests: ADEQUATE

## 2021-07-08 LAB — GASTROINTESTINAL PANEL BY PCR, STOOL (REPLACES STOOL CULTURE)

## 2021-07-08 LAB — CBC
HCT: 29.1 % — ABNORMAL LOW (ref 36.0–46.0)
Hemoglobin: 9.6 g/dL — ABNORMAL LOW (ref 12.0–15.0)
MCH: 29.6 pg (ref 26.0–34.0)
MCHC: 33 g/dL (ref 30.0–36.0)
MCV: 89.8 fL (ref 80.0–100.0)
Platelets: 167 10*3/uL (ref 150–400)
RBC: 3.24 MIL/uL — ABNORMAL LOW (ref 3.87–5.11)
RDW: 18.5 % — ABNORMAL HIGH (ref 11.5–15.5)
WBC: 3.7 10*3/uL — ABNORMAL LOW (ref 4.0–10.5)
nRBC: 0 % (ref 0.0–0.2)

## 2021-07-08 LAB — LEGIONELLA PNEUMOPHILA SEROGP 1 UR AG: L. pneumophila Serogp 1 Ur Ag: NEGATIVE

## 2021-07-08 LAB — MAGNESIUM: Magnesium: 1.7 mg/dL (ref 1.7–2.4)

## 2021-07-08 LAB — PHOSPHORUS: Phosphorus: 3 mg/dL (ref 2.5–4.6)

## 2021-07-08 MED ORDER — CHLORHEXIDINE GLUCONATE CLOTH 2 % EX PADS
6.0000 | MEDICATED_PAD | Freq: Every day | CUTANEOUS | Status: DC
  Administered 2021-07-08 – 2021-07-11 (×4): 6 via TOPICAL

## 2021-07-08 MED ORDER — LIP MEDEX EX OINT
TOPICAL_OINTMENT | CUTANEOUS | Status: DC | PRN
  Filled 2021-07-08: qty 7

## 2021-07-08 MED ORDER — MAGNESIUM SULFATE 2 GM/50ML IV SOLN
2.0000 g | Freq: Once | INTRAVENOUS | Status: AC
  Administered 2021-07-08: 2 g via INTRAVENOUS
  Filled 2021-07-08: qty 50

## 2021-07-08 MED ORDER — MIDODRINE HCL 5 MG PO TABS
10.0000 mg | ORAL_TABLET | Freq: Two times a day (BID) | ORAL | Status: DC
  Administered 2021-07-08 – 2021-07-10 (×4): 10 mg via ORAL
  Filled 2021-07-08 (×4): qty 2

## 2021-07-08 MED ORDER — POTASSIUM CHLORIDE 10 MEQ/100ML IV SOLN
10.0000 meq | INTRAVENOUS | Status: AC
  Administered 2021-07-08 (×4): 10 meq via INTRAVENOUS
  Filled 2021-07-08 (×4): qty 100

## 2021-07-08 NOTE — Progress Notes (Signed)
Looks like Ms. Molly Welch is getting better.  She actually went outside yesterday to enjoy some of the nice warm weather.  Looks like the real problem is going to be there is underlying pulmonary fibrosis.  I am unsure if we are going to be able to do radiation therapy to the lung primary because of this.  We will have to talk to Radiation Oncology about this.  Her appetite might be a little bit better.  Her prealbumin was certainly better than I would have thought.  We do not have any labs on her yet.  Her white cell count has been trending downward.  She does not need any Neupogen.  She had a little bit of diarrhea but now has constipation.  She is not on pressors any longer.  Her blood pressure this morning is 143/62.  There is been no fever.  She has had no bleeding.  She still has quite a bit of anxiety.  Her vital signs show temperature of 98.  Pulse 81.  Blood pressure 134/71.  Oxygen saturation is 96%.  I think she is on 2 L of nasal cannula.  Her lungs sound clear bilaterally.  She has good air movement.  Cardiac exam regular rate and rhythm.  Abdomen is soft.  Bowel sounds are present.  Extremities shows no clubbing, cyanosis or edema.  Hopefully, she will be able to go home sometime this week.  Again, the smoking truly has been a problem for her with respect to this pulmonary fibrosis.  Again we will have to speak with Radiation Oncology and see if they would radiate the lung primary.  If not, will have to continue to move along with chemotherapy.  I know that she is gotten incredible care from all the staff down in the ICU.  I do appreciate all of their compassion and their professional care.  Lattie Haw, MD  Psalm 32:11

## 2021-07-08 NOTE — Progress Notes (Signed)
PT Cancellation Note  Patient Details Name: Molly Welch MRN: [id number] DOB: Mar 09, 1946   Cancelled Treatment:    Reason Eval/Treat Not Completed: Other (comment), patient reports that she ambulated in hall with RN. Will check back another time/   Claretha Cooper 07/08/2021, 3:35 PM Tresa Endo PT Acute Rehabilitation Services Pager [phone] Office [phone]

## 2021-07-08 NOTE — Progress Notes (Signed)
SATURATION QUALIFICATIONS: (This note is used to comply with regulatory documentation for home oxygen)  Patient Saturations on Room Air at Rest = 88%  Patient Saturations on Room Air while Ambulating = 77%   Please briefly explain why patient needs home oxygen:

## 2021-07-08 NOTE — Progress Notes (Signed)
PROGRESS NOTE    Molly Welch  MWU:[id number] DOB: 01-08-1946 DOA: 07/02/2021 PCP: Carolee Rota, NP   Brief Narrative: Patient was admitted by PCCM on 07/02/2021 and transferred to [facility] 07/08/2021. 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  Was treated with pressors and antibiotics.  She had lactic acidosis and elevated procalcitonin which is resolving.  CT scan of the chest showed pulmonary fibrosis ?  Interstitial lung disease.  Patchy consolidation in bilateral lower lobes.  Respiratory virus panel negative.  She was initially on Levophed, then started midodrine and hydrocortisone. Assessment & Plan:   Principal Problem:   Acute respiratory failure with hypoxemia (HCC) Active Problems:   Lung cancer, lower lobe (HCC)   Septic shock (HCC)   Lactic acidosis   Hyponatremia   Leukocytosis   Anemia of chronic disease   Abnormal chest x-ray   Acute hypoxemic respiratory failure due to COVID-19 [facility])   Malnutrition of moderate degree   ILD (interstitial lung disease) (HCC)   IPF (idiopathic pulmonary fibrosis) (HCC)   #1 status post septic shock secondary to  pneumonia requiring pressors now with improvement.  Respiratory virus panel was negative urine Legionella and Streptococcus negative.   She was treated with azithromycin and cefepime. Off pressors 07/07/2021 On midodrine and hydrocortisone White count procalcitonin and lactic acid trending down Will titrate hydrocortisone and midodrine down as tolerated Consult physical therapy Check ambulatory oxygen saturation  #2 pulmonary fibrosis with COPD and ongoing tobacco abuse follow-up with Dr. Chase Caller on discharge.  #3 squamous cell carcinoma of the lung followed by Dr. Marin Olp.  Currently on chemo.  Dr. Marin Olp to discuss  with radiation oncologist to see if radiation would help.  #4 anemia of chronic disease hemoglobin stable  #5 hypokalemia replete and recheck labs.  Check mag level.  #6 anxiety and depression on Xanax and Lamictal as outpatient  #7 neutropenia White count 3.7 trending down.       Nutrition Problem: Moderate Malnutrition Etiology: chronic illness, cancer and cancer related treatments     Signs/Symptoms: moderate fat depletion, moderate muscle depletion    Interventions: Ensure Enlive (each supplement provides 350kcal and 20 grams of protein)  Estimated body mass index is 21.69 kg/m as calculated from the following:   Height as of this encounter: 5\' 2"  (1.575 m).   Weight as of this encounter: 53.8 kg.  DVT prophylaxis: Lovenox Code Status: DNR Family Communication: None at bedside Disposition Plan:  Status is: Inpatient  Remains inpatient appropriate because: IV antibiotics new onset hypoxia   Consultants:  PCCM and oncology  Procedures: None Antimicrobials: Anti-infectives (From admission, onward)    Start     Dose/Rate Route Frequency Ordered Stop   07/05/21 1000  azithromycin (ZITHROMAX) tablet 500 mg  Status:  Discontinued        500 mg Oral Daily 07/04/21 1345 07/06/21 1232   07/04/21 0500  azithromycin (ZITHROMAX) 500 mg in sodium chloride 0.9 % 250 mL IVPB  Status:  Discontinued        500 mg 250 mL/hr over 60 Minutes Intravenous Every 24 hours 07/03/21 1250 07/04/21 1345   07/03/21 1345  ceFEPIme (MAXIPIME) 2 g in sodium chloride 0.9 % 100 mL IVPB        2 g 200 mL/hr over 30 Minutes Intravenous Every 12 hours 07/03/21 1250 07/10/21 0959   07/03/21  1000  valACYclovir (VALTREX) tablet 500 mg        500 mg Oral Every morning 07/03/21 0649     07/03/21 0400  azithromycin (ZITHROMAX) 500 mg in sodium chloride 0.9 % 250 mL IVPB        500 mg 250 mL/hr over 60 Minutes Intravenous  Once 07/03/21 0353 07/03/21 0605   07/03/21 0015  ceFEPIme (MAXIPIME) 2 g in  sodium chloride 0.9 % 100 mL IVPB        2 g 200 mL/hr over 30 Minutes Intravenous  Once 07/03/21 0005 07/03/21 0435        Subjective: Patient is sitting up in bed eating breakfast she feels her appetite is better she feels her breathing is better 95% on 2 L  Objective: Vitals:   07/08/21 0500 07/08/21 0512 07/08/21 0700 07/08/21 0800  BP:   (!) 156/78   Pulse: 83 81 85   Resp: (!) 26 19 (!) 26   Temp:    98 F (36.7 C)  TempSrc:    Oral  SpO2: 94% 96% 95%   Weight:      Height:        Intake/Output Summary (Last 24 hours) at 07/08/2021 1102 Last data filed at 07/07/2021 1900 Gross per 24 hour  Intake 96.62 ml  Output 800 ml  Net -703.38 ml   Filed Weights   07/05/21 0435 07/07/21 0500 07/08/21 0458  Weight: 57 kg 53.3 kg 53.8 kg    Examination:  General exam: Appears in no acute distress Respiratory system: Diminished breath sounds at the bases to auscultation. Respiratory effort normal.  Right upper chest Port-A-Cath in place Cardiovascular system: S1 & S2 heard, RRR. No JVD, murmurs, rubs, gallops or clicks. No pedal edema. Gastrointestinal system: Abdomen is nondistended, soft and nontender. No organomegaly or masses felt. Normal bowel sounds heard. Central nervous system: Alert and oriented. No focal neurological deficits. Extremities: No edema. Skin: No rashes, lesions or ulcers Psychiatry: Judgement and insight appear normal. Mood & affect appropriate.     Data Reviewed: I have personally reviewed following labs and imaging studies  CBC: Recent Labs  Lab 07/02/21 0806 07/02/21 2343 07/03/21 0759 07/04/21 0230 07/05/21 0438 07/06/21 0523 07/07/21 0500 07/08/21 0741  WBC 6.6 12.2*   < > 15.1* 12.5* 6.6 4.6 3.7*  NEUTROABS 3.7 11.8*  --   --   --   --   --   --   HGB 11.7* 10.4*   < > 10.1* 10.7* 10.4* 9.2* 9.6*  HCT 35.9* 33.3*   < > 31.0* 32.7* 32.4* 29.0* 29.1*  MCV 89.5 93.0   < > 88.8 89.3 89.0 92.1 89.8  PLT 236 202   < > 165 165 149* 151 167    < > = values in this interval not displayed.   Basic Metabolic Panel: Recent Labs  Lab 07/04/21 0230 07/05/21 0438 07/06/21 0523 07/07/21 0500 07/08/21 0741  NA 132* 136 138 142 139  K 4.6 4.5 4.1 3.7 3.1*  CL 107 103 104 108 105  CO2 19* 24 27 27 24   GLUCOSE 130* 111* 116* 92 81  BUN 29* 27* 21 23 18   CREATININE 0.75 0.62 0.62 0.46 0.48  CALCIUM 6.9* 8.4* 8.6* 8.6* 8.9  MG 1.9 2.5* 2.0 1.9 1.7  PHOS  --  1.8* 2.9 2.1* 3.0   GFR: Estimated Creatinine Clearance: 49.5 mL/min (by C-G formula based on SCr of 0.48 mg/dL). Liver Function Tests: Recent Labs  Lab 07/02/21 [phone]  07/02/21 2343  AST 18 106*  ALT 11 47*  ALKPHOS 72 62  BILITOT 0.3 0.5  PROT 7.4 6.4*  ALBUMIN 4.0 3.2*   No results for input(s): LIPASE, AMYLASE in the last 168 hours. No results for input(s): AMMONIA in the last 168 hours. Coagulation Profile: Recent Labs  Lab 07/02/21 2343  INR 1.0   Cardiac Enzymes: No results for input(s): CKTOTAL, CKMB, CKMBINDEX, TROPONINI in the last 168 hours. BNP (last 3 results) No results for input(s): PROBNP in the last 8760 hours. HbA1C: No results for input(s): HGBA1C in the last 72 hours. CBG: No results for input(s): GLUCAP in the last 168 hours. Lipid Profile: No results for input(s): CHOL, HDL, LDLCALC, TRIG, CHOLHDL, LDLDIRECT in the last 72 hours. Thyroid Function Tests: No results for input(s): TSH, T4TOTAL, FREET4, T3FREE, THYROIDAB in the last 72 hours. Anemia Panel: No results for input(s): VITAMINB12, FOLATE, FERRITIN, TIBC, IRON, RETICCTPCT in the last 72 hours. Sepsis Labs: Recent Labs  Lab 07/03/21 0618 07/03/21 0759 07/04/21 0230 07/04/21 1128 07/04/21 1622 07/05/21 0435 07/05/21 0438  PROCALCITON  --  28.16 19.55  --   --   --  12.80  LATICACIDVEN 5.6*  --   --  2.9* 2.6* 1.4  --     Recent Results (from the past 240 hour(s))  Resp Panel by RT-PCR (Flu A&B, Covid) Peripheral     Status: None   Collection Time: 07/03/21 12:03 AM    Specimen: Peripheral; Nasopharyngeal(NP) swabs in vial transport medium  Result Value Ref Range Status   SARS Coronavirus 2 by RT PCR NEGATIVE NEGATIVE Final    Comment: (NOTE) SARS-CoV-2 target nucleic acids are NOT DETECTED.  The SARS-CoV-2 RNA is generally detectable in upper respiratory specimens during the acute phase of infection. The lowest concentration of SARS-CoV-2 viral copies this assay can detect is 138 copies/mL. A negative result does not preclude SARS-Cov-2 infection and should not be used as the sole basis for treatment or other patient management decisions. A negative result may occur with  improper specimen collection/handling, submission of specimen other than nasopharyngeal swab, presence of viral mutation(s) within the areas targeted by this assay, and inadequate number of viral copies(<138 copies/mL). A negative result must be combined with clinical observations, patient history, and epidemiological information. The expected result is Negative.  Fact Sheet for Patients:  EntrepreneurPulse.com.au  Fact Sheet for Healthcare Providers:  IncredibleEmployment.be  This test is no t yet approved or cleared by the [place] FDA and  has been authorized for detection and/or diagnosis of SARS-CoV-2 by FDA under an Emergency Use Authorization (EUA). This EUA will remain  in effect (meaning this test can be used) for the duration of the COVID-19 declaration under Section 564(b)(1) of the Act, 21 U.S.C.section 360bbb-3(b)(1), unless the authorization is terminated  or revoked sooner.       Influenza A by PCR NEGATIVE NEGATIVE Final   Influenza B by PCR NEGATIVE NEGATIVE Final    Comment: (NOTE) The Xpert Xpress SARS-CoV-2/FLU/RSV plus assay is intended as an aid in the diagnosis of influenza from Nasopharyngeal swab specimens and should not be used as a sole basis for treatment. Nasal washings and aspirates are unacceptable  for Xpert Xpress SARS-CoV-2/FLU/RSV testing.  Fact Sheet for Patients: EntrepreneurPulse.com.au  Fact Sheet for Healthcare Providers: IncredibleEmployment.be  This test is not yet approved or cleared by the [place] FDA and has been authorized for detection and/or diagnosis of SARS-CoV-2 by FDA under an Emergency Use Authorization (EUA). This  EUA will remain in effect (meaning this test can be used) for the duration of the COVID-19 declaration under Section 564(b)(1) of the Act, 21 U.S.C. section 360bbb-3(b)(1), unless the authorization is terminated or revoked.  Performed at [facility], [place] [address] [place], [place] [address]   Culture, blood (Routine x 2)     Status: None   Collection Time: 07/03/21 12:32 AM   Specimen: BLOOD  Result Value Ref Range Status   Specimen Description   Final    BLOOD RIGHT ANTECUBITAL Performed at [facility] [address], [place], [place] [address]    Special Requests   Final    Blood Culture adequate volume BOTTLES DRAWN AEROBIC AND ANAEROBIC Performed at [facility] [address], [place], [place] [address]    Culture   Final    NO GROWTH 5 DAYS Performed at [facility], [place] [address] [place], [place] [address]    Report Status 07/08/2021 FINAL  Final  Culture, blood (Routine x 2)     Status: None   Collection Time: 07/03/21 12:32 AM   Specimen: BLOOD  Result Value Ref Range Status   Specimen Description   Final    BLOOD BLOOD LEFT FOREARM Performed at [facility] [place] [address] [place], [place] [address]    Special Requests   Final    Blood Culture adequate volume BOTTLES DRAWN AEROBIC AND ANAEROBIC Performed at [facility] [address], [place], [place] [address]    Culture   Final    NO GROWTH 5 DAYS Performed at [facility], [place] [address] [place], [place]  [address]    Report Status 07/08/2021 FINAL  Final  MRSA Next Gen by PCR, Nasal     Status: None   Collection Time: 07/03/21  8:25 AM   Specimen: Nasal Mucosa; Nasal Swab  Result Value Ref Range Status   MRSA by PCR Next Gen NOT DETECTED NOT DETECTED Final    Comment: (NOTE) The GeneXpert MRSA Assay (FDA approved for NASAL specimens only), is one component of a comprehensive MRSA colonization surveillance program. It is not intended to diagnose MRSA infection nor to guide or monitor treatment for MRSA infections. Test performance is not FDA approved in patients less than 58 years old. Performed at [facility], [place] [address], [place], [place] [address]   Respiratory (~20 pathogens) panel by PCR     Status: None   Collection Time: 07/05/21  1:18 PM   Specimen: Nasopharyngeal Swab; Respiratory  Result Value Ref Range Status   Adenovirus NOT DETECTED NOT DETECTED Final   Coronavirus 229E NOT DETECTED NOT DETECTED Final    Comment: (NOTE) The Coronavirus on the Respiratory Panel, DOES NOT test for the novel  Coronavirus (2019 nCoV)    Coronavirus HKU1 NOT DETECTED NOT DETECTED Final   Coronavirus NL63 NOT DETECTED NOT DETECTED Final   Coronavirus OC43 NOT DETECTED NOT DETECTED Final   Metapneumovirus NOT DETECTED NOT DETECTED Final   Rhinovirus / Enterovirus NOT DETECTED NOT DETECTED Final   Influenza A NOT DETECTED NOT DETECTED Final   Influenza B NOT DETECTED NOT DETECTED Final   Parainfluenza Virus 1 NOT DETECTED NOT DETECTED Final   Parainfluenza Virus 2 NOT DETECTED NOT DETECTED Final   Parainfluenza Virus 3 NOT DETECTED NOT DETECTED Final   Parainfluenza Virus 4 NOT DETECTED NOT DETECTED Final   Respiratory Syncytial Virus NOT DETECTED NOT DETECTED Final  Bordetella pertussis NOT DETECTED NOT DETECTED Final   Bordetella Parapertussis NOT DETECTED NOT DETECTED Final   Chlamydophila pneumoniae NOT DETECTED NOT DETECTED Final   Mycoplasma pneumoniae NOT  DETECTED NOT DETECTED Final    Comment: Performed at [facility], [place] [address], [place], [place] [address]         Radiology Studies: No results found.      Scheduled Meds:  ALPRAZolam  0.5 mg Oral QID   budesonide (PULMICORT) nebulizer solution  0.25 mg Nebulization BID   chlorhexidine  15 mL Mouth Rinse QID   Chlorhexidine Gluconate Cloth  6 each Topical Daily   enoxaparin (LOVENOX) injection  40 mg Subcutaneous Q24H   famotidine  20 mg Oral QHS   feeding supplement  237 mL Oral TID BM   hydrocortisone sod succinate (SOLU-CORTEF) inj  50 mg Intravenous BID   ipratropium-albuterol  3 mL Nebulization BID   lamoTRIgine  100 mg Oral q morning   lipase/protease/amylase  72,000 Units Oral TID WC   mouth rinse  15 mL Mouth Rinse q12n4p   midodrine  10 mg Oral TID WC   multivitamin with minerals  1 tablet Oral q morning   sodium bicarbonate/sodium chloride   Mouth Rinse Q4H   valACYclovir  500 mg Oral q morning   Continuous Infusions:  sodium chloride 250 mL (07/07/21 2234)   ceFEPime (MAXIPIME) IV Stopped (07/07/21 2318)     LOS: 5 days    Time spent: 38 minutes  Georgette Shell, MD 07/08/2021, 11:02 AM

## 2021-07-09 ENCOUNTER — Encounter: Payer: Self-pay | Admitting: Internal Medicine

## 2021-07-09 DIAGNOSIS — A419 Sepsis, unspecified organism: Secondary | ICD-10-CM | POA: Diagnosis not present

## 2021-07-09 DIAGNOSIS — R531 Weakness: Secondary | ICD-10-CM | POA: Diagnosis not present

## 2021-07-09 DIAGNOSIS — R509 Fever, unspecified: Secondary | ICD-10-CM | POA: Diagnosis not present

## 2021-07-09 DIAGNOSIS — J189 Pneumonia, unspecified organism: Secondary | ICD-10-CM | POA: Diagnosis not present

## 2021-07-09 LAB — RHEUMATOID FACTOR: Rheumatoid fact SerPl-aCnc: 74.5 IU/mL — ABNORMAL HIGH (ref <14.0)

## 2021-07-09 LAB — CBC
HCT: 28.2 % — ABNORMAL LOW (ref 36.0–46.0)
Hemoglobin: 9.1 g/dL — ABNORMAL LOW (ref 12.0–15.0)
MCH: 29.1 pg (ref 26.0–34.0)
MCHC: 32.3 g/dL (ref 30.0–36.0)
MCV: 90.1 fL (ref 80.0–100.0)
Platelets: 158 10*3/uL (ref 150–400)
RBC: 3.13 MIL/uL — ABNORMAL LOW (ref 3.87–5.11)
RDW: 18.1 % — ABNORMAL HIGH (ref 11.5–15.5)
WBC: 2.4 10*3/uL — ABNORMAL LOW (ref 4.0–10.5)
nRBC: 0 % (ref 0.0–0.2)

## 2021-07-09 LAB — COMPREHENSIVE METABOLIC PANEL
ALT: 54 U/L — ABNORMAL HIGH (ref 0–44)
AST: 65 U/L — ABNORMAL HIGH (ref 15–41)
Albumin: 3.1 g/dL — ABNORMAL LOW (ref 3.5–5.0)
Alkaline Phosphatase: 77 U/L (ref 38–126)
Anion gap: 13 (ref 5–15)
BUN: 12 mg/dL (ref 8–23)
CO2: 28 mmol/L (ref 22–32)
Calcium: 8.6 mg/dL — ABNORMAL LOW (ref 8.9–10.3)
Chloride: 100 mmol/L (ref 98–111)
Creatinine, Ser: 0.52 mg/dL (ref 0.44–1.00)
GFR, Estimated: >60 mL/min (ref >60)
Glucose, Bld: 102 mg/dL — ABNORMAL HIGH (ref 70–99)
Potassium: 3 mmol/L — ABNORMAL LOW (ref 3.5–5.1)
Sodium: 141 mmol/L (ref 135–145)
Total Bilirubin: 0.6 mg/dL (ref 0.3–1.2)
Total Protein: 6.6 g/dL (ref 6.5–8.1)

## 2021-07-09 LAB — ANTINUCLEAR ANTIBODIES, IFA
ANA Ab, IFA: NEGATIVE
ANA Ab, IFA: NEGATIVE

## 2021-07-09 LAB — MAGNESIUM: Magnesium: 1.8 mg/dL (ref 1.7–2.4)

## 2021-07-09 LAB — CYCLIC CITRUL PEPTIDE ANTIBODY, IGG/IGA: CCP Antibodies IgG/IgA: 2 units (ref 0–19)

## 2021-07-09 LAB — ANTI-DNA ANTIBODY, DOUBLE-STRANDED: ds DNA Ab: 2 IU/mL (ref 0–9)

## 2021-07-09 LAB — PHOSPHORUS: Phosphorus: 2.6 mg/dL (ref 2.5–4.6)

## 2021-07-09 MED ORDER — POTASSIUM CHLORIDE CRYS ER 20 MEQ PO TBCR
40.0000 meq | EXTENDED_RELEASE_TABLET | ORAL | Status: AC
  Administered 2021-07-09 (×2): 40 meq via ORAL
  Filled 2021-07-09 (×2): qty 2

## 2021-07-09 MED ORDER — SODIUM CHLORIDE 0.9% FLUSH
10.0000 mL | INTRAVENOUS | Status: DC | PRN
  Administered 2021-07-11: 10 mL

## 2021-07-09 MED ORDER — GUAIFENESIN-DM 100-10 MG/5ML PO SYRP
5.0000 mL | ORAL_SOLUTION | ORAL | Status: DC | PRN
  Administered 2021-07-09 – 2021-07-11 (×5): 5 mL via ORAL
  Filled 2021-07-09 (×5): qty 10

## 2021-07-09 MED ORDER — HYDROCORTISONE SOD SUC (PF) 100 MG IJ SOLR
50.0000 mg | Freq: Every day | INTRAMUSCULAR | Status: DC
  Filled 2021-07-09: qty 1

## 2021-07-09 NOTE — Progress Notes (Signed)
Physical Therapy Treatment Patient Details Name: Molly Welch MRN: [id number] DOB: [birth year] Today's Date: 07/09/2021   History of Present Illness Pt is a 76 year old woman admitted with septic PNA, acute respiratory failure and cardiogenic shock on 07/03/21 in the setting of chemotherapy for lung cancer. Requires pressors. PMH: former smoker, anxiety depression.    PT Comments    Pt agreeable to working with PT. Assessed BP in sitting and standing. Ambulated ~60 feet: O2 80% on RA, dyspnea 3/4. Reapplied [place] O2 2L end of session- sats 93%. Encouraged pt to sit up as tolerated.     Recommendations for follow up therapy are one component of a multi-disciplinary discharge planning process, led by the attending physician.  Recommendations may be updated based on patient status, additional functional criteria and insurance authorization.  Follow Up Recommendations  Home health PT     Assistance Recommended at Discharge Frequent or constant Supervision/Assistance  Patient can return home with the following A little help with walking and/or transfers   Equipment Recommendations  None recommended by PT    Recommendations for Other Services       Precautions / Restrictions Precautions Precautions: Fall Precaution Comments: chemo precautions; monitor O2 Restrictions Weight Bearing Restrictions: No     Mobility  Bed Mobility               General bed mobility comments: oob in recliner    Transfers Overall transfer level: Needs assistance Equipment used: Rolling walker (2 wheels) Transfers: Sit to/from Stand Sit to Stand: Min guard           General transfer comment: increased time. cues for hand placement. min guard for safety.    Ambulation/Gait Ambulation/Gait assistance: Min assist Gait Distance (Feet): 60 Feet Assistive device: Rolling walker (2 wheels) Gait Pattern/deviations: Step-through pattern;Decreased step length - right;Decreased step length -  left;Decreased stride length       General Gait Details: assist to stabilize. dyspnea 3/4. O2 80% on RA while ambulating.   Stairs             Wheelchair Mobility    Modified Rankin (Stroke Patients Only)       Balance Overall balance assessment: Needs assistance         Standing balance support: Bilateral upper extremity supported;Reliant on assistive device for balance Standing balance-Leahy Scale: Poor                              Cognition Arousal/Alertness: Awake/alert Behavior During Therapy: WFL for tasks assessed/performed Overall Cognitive Status: Within Functional Limits for tasks assessed                                          Exercises      General Comments        Pertinent Vitals/Pain Pain Assessment: Faces Faces Pain Scale: Hurts a little bit Pain Location: chest Pain Descriptors / Indicators: Discomfort;Sore Pain Intervention(s): Limited activity within patient's tolerance;Monitored during session;Repositioned    Home Living                          Prior Function            PT Goals (current goals can now be found in the care plan section) Progress towards PT goals: Progressing toward goals  Frequency    Min 3X/week      PT Plan Current plan remains appropriate    Co-evaluation              AM-PAC PT "6 Clicks" Mobility   Outcome Measure  Help needed turning from your back to your side while in a flat bed without using bedrails?: A Little Help needed moving from lying on your back to sitting on the side of a flat bed without using bedrails?: A Little Help needed moving to and from a bed to a chair (including a wheelchair)?: A Little Help needed standing up from a chair using your arms (e.g., wheelchair or bedside chair)?: A Little Help needed to walk in hospital room?: A Little Help needed climbing 3-5 steps with a railing? : A Lot 6 Click Score: 17    End of Session  Equipment Utilized During Treatment: Gait belt;Oxygen Activity Tolerance: Patient limited by fatigue Patient left: in chair;with call bell/phone within reach;with chair alarm set   PT Visit Diagnosis: Difficulty in walking, not elsewhere classified (R26.2);Muscle weakness (generalized) (M62.81);Unsteadiness on feet (R26.81)     Time: 9892-1194 PT Time Calculation (min) (ACUTE ONLY): 30 min  Charges:  $Gait Training: 8-22 mins $Therapeutic Activity: 8-22 mins              Doreatha Massed, PT Acute Rehabilitation  Office: [phone] Pager: [phone]

## 2021-07-09 NOTE — Progress Notes (Signed)
Molly Welch is now out of the ICU.  She is up on 3 W.  She is little bit confused this morning.  She thinks that she is going to die because she has a DNR bracelet on.  I am not sure when she was made DNR.  I told her that I do not see any evidence as she is going to die anytime soon.  She is getting better.  I told her that she should be able to go home soon.  She still has an incredible amount of anxiety.  Hopefully she can be given something that can help with this.  Her labs show white cell count 2.4.  I will still hold on Neupogen for her for right now.  Her potassium is 3.0.  Her liver function studies are little bit elevated.  We will have to watch this.  She is working with physical therapy to try to get stronger.  I am not sure how much she really is eating.  There is no nausea or vomiting.  She is not having diarrhea.  Her lungs sound clear bilaterally.  Cardiac exam regular rate and rhythm.  Abdomen is soft.  For right now, we will just see when she will be able to go home.  Again she has the pulmonary fibrosis.  I am not sure she can be a candidate for radiation therapy.  I do appreciate the wonderful care that she will get from all the hard-working staff up on 3 W.  Lattie Haw, MD  [place] 14:14

## 2021-07-09 NOTE — Telephone Encounter (Signed)
Patient scheduled with Rexene Edison, NP on 07/25/2021 at 12pm for HFU and scheduled for consult on 08/30/2021 at 230pm with MR.

## 2021-07-09 NOTE — TOC Initial Note (Signed)
Transition of Care [facility]) - Initial/Assessment Note    Patient Details  Name: Molly Welch MRN: [id number] Date of Birth: 06-Apr-1946  Transition of Care [facility]) CM/SW Contact:    Lennart Pall, LCSW Phone Number: 07/09/2021, 3:48 PM  Clinical Narrative:                 Met with pt to introduce self/ TOC role with dc planning and complete readmission risk screening.  Pt very pleasant and engaged.  Reports that her adult daughter is living with her and not currently working.  Daughter assists as needed with house management and transportation.  She is has PCP and is followed at [facility] as well.  Pt's goal is to dc home and is agreeable to recommended HHPT follow up.  Will follow up with daughter to check if any additional needs per pt request.  Continue to follow.  Expected Discharge Plan: [place] Barriers to Discharge: Continued Medical Work up   Patient Goals and CMS Choice Patient states their goals for this hospitalization and ongoing recovery are:: return home   Choice offered to / list presented to : Patient  Expected Discharge Plan and Services Expected Discharge Plan: [place] In-house Referral: Clinical Social Work Discharge Planning Services: CM Consult   Living arrangements for the past 2 months: [place]                                      Prior Living Arrangements/Services Living arrangements for the past 2 months: Single Family Home Lives with:: Adult Children Patient language and need for interpreter reviewed:: Yes Do you feel safe going back to the place where you live?: Yes      Need for Family Participation in Patient Care: Yes (Comment) Care giver support system in place?: Yes (comment)   Criminal Activity/Legal Involvement Pertinent to Current Situation/Hospitalization: No - Comment as needed  Activities of Daily Living Home Assistive Devices/Equipment: None (to be assessed prior to  discharge) ADL Screening (condition at time of admission) Patient's cognitive ability adequate to safely complete daily activities?: Yes Is the patient deaf or have difficulty hearing?: No Does the patient have difficulty seeing, even when wearing glasses/contacts?: No Does the patient have difficulty concentrating, remembering, or making decisions?: No Patient able to express need for assistance with ADLs?: Yes Does the patient have difficulty dressing or bathing?: Yes Independently performs ADLs?: No Does the patient have difficulty walking or climbing stairs?: Yes Weakness of Legs: Both Weakness of Arms/Hands: Both  Permission Sought/Granted Permission sought to share information with : Family Supports Permission granted to share information with : Yes, Verbal Permission Granted  Share Information with NAME: Kassady Laboy     Permission granted to share info w Relationship: daughter  Permission granted to share info w Contact Information: [phone]  Emotional Assessment Appearance:: Appears stated age Attitude/Demeanor/Rapport: Engaged Affect (typically observed): Calm Orientation: : Oriented to Self, Oriented to Place, Oriented to  Time, Oriented to Situation Alcohol / Substance Use: Not Applicable Psych Involvement: No (comment)  Admission diagnosis:  Abnormal chest x-ray [R93.89] Acute respiratory failure with hypoxia ([place]) [J96.01] Septic shock ([place]) [A41.9, R65.21] Acute hypoxemic respiratory failure due to COVID-19 ([place]) [U07.1, J96.01] Patient Active Problem List   Diagnosis Date Noted   IPF (idiopathic pulmonary fibrosis) ([place])     Class: Chronic   ILD (interstitial lung  disease) ([place])    Malnutrition of moderate degree 07/04/2021   Septic shock ([place]) 07/03/2021   Acute respiratory failure with hypoxemia ([place]) 07/03/2021   Lactic acidosis 07/03/2021   Hyponatremia 07/03/2021   Leukocytosis 07/03/2021   Anemia of chronic disease 07/03/2021   Abnormal chest  x-ray 07/03/2021   Acute hypoxemic respiratory failure due to COVID-19 [facility]) 07/03/2021   Goals of care, counseling/discussion 04/19/2021   Lung cancer, lower lobe ([place]) 04/19/2021   Mass of left lung    Recurrent falls 03/24/2021   CAP (community acquired pneumonia) 03/23/2021   Vertigo 03/23/2021   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   Tobacco abuse 03/23/2021   Debility 03/23/2021   Intractable nausea and vomiting 03/23/2021   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   Tremor of unknown origin 10/18/2020   Anxiety    CONTACT DERMATITIS&OTHER ECZEMA DUE TO PLANTS 08/17/2010   PCP:  Carolee Rota, NP Pharmacy:   [place], [place] - [address] [place] [address] Phone: [phone] Fax: [phone]     Social Determinants of Health (SDOH) Interventions    Readmission Risk Interventions Readmission Risk Prevention Plan 07/09/2021  Transportation Screening Complete  Medication Review ([place]) Complete  PCP or Specialist appointment within 3-5 days of discharge Complete  HRI or [place] Complete  SW Recovery Care/Counseling Consult Complete  [place] Not Applicable  Some recent data might be hidden

## 2021-07-09 NOTE — Progress Notes (Signed)
PROGRESS NOTE    Molly Welch  GUR:[id number] DOB: 05-Feb-1946 DOA: 07/02/2021 PCP: Carolee Rota, NP   Brief Narrative: Patient was admitted by PCCM on 07/02/2021 and transferred to [facility] 07/08/2021. 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  Was treated with pressors and antibiotics.  She had lactic acidosis and elevated procalcitonin which is resolving.  CT scan of the chest showed pulmonary fibrosis ?  Interstitial lung disease.  Patchy consolidation in bilateral lower lobes.  Respiratory virus panel negative.  She was initially on Levophed, then started midodrine and hydrocortisone. Assessment & Plan:   Principal Problem:   Acute respiratory failure with hypoxemia (HCC) Active Problems:   Lung cancer, lower lobe (HCC)   Septic shock (HCC)   Lactic acidosis   Hyponatremia   Leukocytosis   Anemia of chronic disease   Abnormal chest x-ray   Acute hypoxemic respiratory failure due to COVID-19 [facility])   Malnutrition of moderate degree   ILD (interstitial lung disease) (HCC)   IPF (idiopathic pulmonary fibrosis) (HCC)   #1 status post septic shock secondary to  pneumonia requiring pressors now with improvement.  Respiratory virus panel was negative  urine Legionella and Streptococcus negative.   She was treated with azithromycin and cefepime. Off pressors 07/07/2021 On midodrine and hydrocortisone taper these as tolerated. White count procalcitonin and lactic acid trending down Will titrate hydrocortisone and midodrine down as tolerated  PT consult pending ambulatory oxygen saturation she is 77% on room air while ambulating.  #2 pulmonary fibrosis with COPD and ongoing tobacco abuse follow-up with Dr. Chase Caller on discharge.  #3 squamous cell carcinoma of the lung followed by  Dr. Marin Olp.  Currently on chemo.    #4 anemia of chronic disease hemoglobin stable  #5 hypokalemia potassium 3.0 magnesium 1.8 replete and recheck labs in AM.  #6 anxiety and depression on Xanax and Lamictal as outpatient  #7 neutropenia White count 3.7 trending down.  Monitor daily.  #8 mildly elevated LFTs monitor them closely I have stopped her Tylenol.  Recheck labs in AM.    Nutrition Problem: Moderate Malnutrition Etiology: chronic illness, cancer and cancer related treatments     Signs/Symptoms: moderate fat depletion, moderate muscle depletion    Interventions: Ensure Enlive (each supplement provides 350kcal and 20 grams of protein)  Estimated body mass index is 21.42 kg/m as calculated from the following:   Height as of this encounter: 5\' 2"  (1.575 m).   Weight as of this encounter: 53.1 kg.  DVT prophylaxis: Lovenox Code Status: DNR Family Communication: None at bedside Disposition Plan:  Status is: Inpatient  Remains inpatient appropriate because: IV antibiotics new onset hypoxia   Consultants:  PCCM and oncology  Procedures: None Antimicrobials: Anti-infectives (From admission, onward)    Start     Dose/Rate Route Frequency Ordered Stop   07/05/21 1000  azithromycin (ZITHROMAX) tablet 500 mg  Status:  Discontinued        500 mg Oral Daily 07/04/21 1345 07/06/21 1232   07/04/21 0500  azithromycin (ZITHROMAX) 500 mg in sodium chloride 0.9 % 250 mL IVPB  Status:  Discontinued        500 mg 250 mL/hr over 60 Minutes Intravenous Every 24 hours 07/03/21 1250 07/04/21 1345   07/03/21 1345  ceFEPIme (MAXIPIME) 2 g in sodium chloride 0.9 % 100 mL IVPB  2 g 200 mL/hr over 30 Minutes Intravenous Every 12 hours 07/03/21 1250 07/10/21 0959   07/03/21 1000  valACYclovir (VALTREX) tablet 500 mg        500 mg Oral Every morning 07/03/21 0649     07/03/21 0400  azithromycin (ZITHROMAX) 500 mg in sodium chloride 0.9 % 250 mL IVPB        500 mg 250 mL/hr over  60 Minutes Intravenous  Once 07/03/21 0353 07/03/21 0605   07/03/21 0015  ceFEPIme (MAXIPIME) 2 g in sodium chloride 0.9 % 100 mL IVPB        2 g 200 mL/hr over 30 Minutes Intravenous  Once 07/03/21 0005 07/03/21 0435        Subjective:  She is resting in bed Little more confused today staff were trying to clean her and change her briefs when I saw her She does not appear to be in any acute distress Her p.o. intake is poor  Objective: Vitals:   07/09/21 0121 07/09/21 0300 07/09/21 0609 07/09/21 0818  BP: 140/89  (!) 148/83   Pulse: 94  96   Resp: 18  18   Temp: 98.2 F (36.8 C)  98.9 F (37.2 C)   TempSrc:      SpO2: 96%  95% 95%  Weight:  53.1 kg    Height:        Intake/Output Summary (Last 24 hours) at 07/09/2021 1311 Last data filed at 07/09/2021 0600 Gross per 24 hour  Intake 871.16 ml  Output 1400 ml  Net -528.84 ml    Filed Weights   07/07/21 0500 07/08/21 0458 07/09/21 0300  Weight: 53.3 kg 53.8 kg 53.1 kg    Examination:  General exam: Appears in no acute distress Respiratory system: Diminished breath sounds at the bases to auscultation. Respiratory effort normal.  Right upper chest Port-A-Cath in place Cardiovascular system: S1 & S2 heard, RRR. No JVD, murmurs, rubs, gallops or clicks. No pedal edema. Gastrointestinal system: Abdomen is nondistended, soft and nontender. No organomegaly or masses felt. Normal bowel sounds heard. Central nervous system: Alert and oriented. No focal neurological deficits. Extremities: No edema. Skin: No rashes, lesions or ulcers Psychiatry: Judgement and insight appear normal. Mood & affect appropriate.     Data Reviewed: I have personally reviewed following labs and imaging studies  CBC: Recent Labs  Lab 07/02/21 2343 07/03/21 0759 07/05/21 0438 07/06/21 0523 07/07/21 0500 07/08/21 0741 07/09/21 0349  WBC 12.2*   < > 12.5* 6.6 4.6 3.7* 2.4*  NEUTROABS 11.8*  --   --   --   --   --   --   HGB 10.4*   < > 10.7*  10.4* 9.2* 9.6* 9.1*  HCT 33.3*   < > 32.7* 32.4* 29.0* 29.1* 28.2*  MCV 93.0   < > 89.3 89.0 92.1 89.8 90.1  PLT 202   < > 165 149* 151 167 158   < > = values in this interval not displayed.    Basic Metabolic Panel: Recent Labs  Lab 07/05/21 0438 07/06/21 0523 07/07/21 0500 07/08/21 0741 07/09/21 0349  NA 136 138 142 139 141  K 4.5 4.1 3.7 3.1* 3.0*  CL 103 104 108 105 100  CO2 24 27 27 24 28   GLUCOSE 111* 116* 92 81 102*  BUN 27* 21 23 18 12   CREATININE 0.62 0.62 0.46 0.48 0.52  CALCIUM 8.4* 8.6* 8.6* 8.9 8.6*  MG 2.5* 2.0 1.9 1.7 1.8  PHOS 1.8* 2.9 2.1* 3.0  2.6    GFR: Estimated Creatinine Clearance: 49.5 mL/min (by C-G formula based on SCr of 0.52 mg/dL). Liver Function Tests: Recent Labs  Lab 07/02/21 2343 07/09/21 0349  AST 106* 65*  ALT 47* 54*  ALKPHOS 62 77  BILITOT 0.5 0.6  PROT 6.4* 6.6  ALBUMIN 3.2* 3.1*    No results for input(s): LIPASE, AMYLASE in the last 168 hours. No results for input(s): AMMONIA in the last 168 hours. Coagulation Profile: Recent Labs  Lab 07/02/21 2343  INR 1.0    Cardiac Enzymes: No results for input(s): CKTOTAL, CKMB, CKMBINDEX, TROPONINI in the last 168 hours. BNP (last 3 results) No results for input(s): PROBNP in the last 8760 hours. HbA1C: No results for input(s): HGBA1C in the last 72 hours. CBG: No results for input(s): GLUCAP in the last 168 hours. Lipid Profile: No results for input(s): CHOL, HDL, LDLCALC, TRIG, CHOLHDL, LDLDIRECT in the last 72 hours. Thyroid Function Tests: No results for input(s): TSH, T4TOTAL, FREET4, T3FREE, THYROIDAB in the last 72 hours. Anemia Panel: No results for input(s): VITAMINB12, FOLATE, FERRITIN, TIBC, IRON, RETICCTPCT in the last 72 hours. Sepsis Labs: Recent Labs  Lab 07/03/21 0618 07/03/21 0759 07/04/21 0230 07/04/21 1128 07/04/21 1622 07/05/21 0435 07/05/21 0438  PROCALCITON  --  28.16 19.55  --   --   --  12.80  LATICACIDVEN 5.6*  --   --  2.9* 2.6* 1.4  --       Recent Results (from the past 240 hour(s))  Resp Panel by RT-PCR (Flu A&B, Covid) Peripheral     Status: None   Collection Time: 07/03/21 12:03 AM   Specimen: Peripheral; Nasopharyngeal(NP) swabs in vial transport medium  Result Value Ref Range Status   SARS Coronavirus 2 by RT PCR NEGATIVE NEGATIVE Final    Comment: (NOTE) SARS-CoV-2 target nucleic acids are NOT DETECTED.  The SARS-CoV-2 RNA is generally detectable in upper respiratory specimens during the acute phase of infection. The lowest concentration of SARS-CoV-2 viral copies this assay can detect is 138 copies/mL. A negative result does not preclude SARS-Cov-2 infection and should not be used as the sole basis for treatment or other patient management decisions. A negative result may occur with  improper specimen collection/handling, submission of specimen other than nasopharyngeal swab, presence of viral mutation(s) within the areas targeted by this assay, and inadequate number of viral copies(<138 copies/mL). A negative result must be combined with clinical observations, patient history, and epidemiological information. The expected result is Negative.  Fact Sheet for Patients:  EntrepreneurPulse.com.au  Fact Sheet for Healthcare Providers:  IncredibleEmployment.be  This test is no t yet approved or cleared by the [place] FDA and  has been authorized for detection and/or diagnosis of SARS-CoV-2 by FDA under an Emergency Use Authorization (EUA). This EUA will remain  in effect (meaning this test can be used) for the duration of the COVID-19 declaration under Section 564(b)(1) of the Act, 21 U.S.C.section 360bbb-3(b)(1), unless the authorization is terminated  or revoked sooner.       Influenza A by PCR NEGATIVE NEGATIVE Final   Influenza B by PCR NEGATIVE NEGATIVE Final    Comment: (NOTE) The Xpert Xpress SARS-CoV-2/FLU/RSV plus assay is intended as an aid in the  diagnosis of influenza from Nasopharyngeal swab specimens and should not be used as a sole basis for treatment. Nasal washings and aspirates are unacceptable for Xpert Xpress SARS-CoV-2/FLU/RSV testing.  Fact Sheet for Patients: EntrepreneurPulse.com.au  Fact Sheet for Healthcare Providers: IncredibleEmployment.be  This  test is not yet approved or cleared by the [place] and has been authorized for detection and/or diagnosis of SARS-CoV-2 by FDA under an Emergency Use Authorization (EUA). This EUA will remain in effect (meaning this test can be used) for the duration of the COVID-19 declaration under Section 564(b)(1) of the Act, 21 U.S.C. section 360bbb-3(b)(1), unless the authorization is terminated or revoked.  Performed at [facility], [place] [address], [place], [place] [address]   Culture, blood (Routine x 2)     Status: None   Collection Time: 07/03/21 12:32 AM   Specimen: BLOOD  Result Value Ref Range Status   Specimen Description   Final    BLOOD RIGHT ANTECUBITAL Performed at [place] [address] [place], [place] [address]    Special Requests   Final    Blood Culture adequate volume BOTTLES DRAWN AEROBIC AND ANAEROBIC Performed at [facility] [address] [place], [place] [address]    Culture   Final    NO GROWTH 5 DAYS Performed at [facility], [place] [address] [place], [place] [address]    Report Status 07/08/2021 FINAL  Final  Culture, blood (Routine x 2)     Status: None   Collection Time: 07/03/21 12:32 AM   Specimen: BLOOD  Result Value Ref Range Status   Specimen Description   Final    BLOOD BLOOD LEFT FOREARM Performed at [facility] [address], [place], [place] [address]    Special Requests   Final    Blood Culture adequate volume BOTTLES DRAWN AEROBIC AND ANAEROBIC Performed at [facility] [address], [place], [place] [address]    Culture   Final    NO GROWTH 5 DAYS Performed at [facility], [place] [address], [place], [place] [address]    Report Status 07/08/2021 FINAL  Final  MRSA Next Gen by PCR, Nasal     Status: None   Collection Time: 07/03/21  8:25 AM   Specimen: Nasal Mucosa; Nasal Swab  Result Value Ref Range Status   MRSA by PCR Next Gen NOT DETECTED NOT DETECTED Final    Comment: (NOTE) The GeneXpert MRSA Assay (FDA approved for NASAL specimens only), is one component of a comprehensive MRSA colonization surveillance program. It is not intended to diagnose MRSA infection nor to guide or monitor treatment for MRSA infections. Test performance is not FDA approved in patients less than 1 years old. Performed at [facility], [place] [address], [place],  [address]   Respiratory (~20 pathogens) panel by PCR     Status: None   Collection Time: 07/05/21  1:18 PM   Specimen: Nasopharyngeal Swab; Respiratory  Result Value Ref Range Status   Adenovirus NOT DETECTED NOT DETECTED Final   Coronavirus 229E NOT DETECTED NOT DETECTED Final    Comment: (NOTE) The Coronavirus on the Respiratory Panel, DOES NOT test for the novel  Coronavirus (2019 nCoV)    Coronavirus HKU1 NOT DETECTED NOT DETECTED Final   Coronavirus NL63 NOT DETECTED NOT DETECTED Final   Coronavirus OC43 NOT DETECTED NOT DETECTED Final   Metapneumovirus NOT DETECTED NOT DETECTED Final   Rhinovirus / Enterovirus NOT DETECTED NOT DETECTED Final   Influenza A NOT DETECTED NOT DETECTED Final   Influenza B NOT DETECTED NOT DETECTED Final   Parainfluenza Virus 1 NOT DETECTED NOT DETECTED Final   Parainfluenza Virus 2 NOT DETECTED NOT DETECTED  Final   Parainfluenza Virus 3 NOT DETECTED NOT DETECTED Final   Parainfluenza Virus 4 NOT DETECTED NOT DETECTED Final   Respiratory Syncytial Virus NOT DETECTED NOT DETECTED Final   Bordetella pertussis NOT DETECTED NOT  DETECTED Final   Bordetella Parapertussis NOT DETECTED NOT DETECTED Final   Chlamydophila pneumoniae NOT DETECTED NOT DETECTED Final   Mycoplasma pneumoniae NOT DETECTED NOT DETECTED Final    Comment: Performed at [facility], [place] [address], [place], [place] [address]  Gastrointestinal Panel by PCR , Stool     Status: None   Collection Time: 07/06/21 11:30 AM   Specimen: Stool  Result Value Ref Range Status   Campylobacter species NOT DETECTED NOT DETECTED Final   Plesimonas shigelloides NOT DETECTED NOT DETECTED Final   Salmonella species NOT DETECTED NOT DETECTED Final   Yersinia enterocolitica NOT DETECTED NOT DETECTED Final   Vibrio species NOT DETECTED NOT DETECTED Final   Vibrio cholerae NOT DETECTED NOT DETECTED Final   Enteroaggregative E coli (EAEC) NOT DETECTED NOT DETECTED Final   Enteropathogenic E coli (EPEC) NOT DETECTED NOT DETECTED Final   Enterotoxigenic E coli (ETEC) NOT DETECTED NOT DETECTED Final   Shiga like toxin producing E coli (STEC) NOT DETECTED NOT DETECTED Final   Shigella/Enteroinvasive E coli (EIEC) NOT DETECTED NOT DETECTED Final   Cryptosporidium NOT DETECTED NOT DETECTED Final   Cyclospora cayetanensis NOT DETECTED NOT DETECTED Final   Entamoeba histolytica NOT DETECTED NOT DETECTED Final   Giardia lamblia NOT DETECTED NOT DETECTED Final   Adenovirus F40/41 NOT DETECTED NOT DETECTED Final   Astrovirus NOT DETECTED NOT DETECTED Final   Norovirus GI/GII NOT DETECTED NOT DETECTED Final   Rotavirus A NOT DETECTED NOT DETECTED Final   Sapovirus (I, II, IV, and V) NOT DETECTED NOT DETECTED Final    Comment: Performed at [facility], [address], [place], [place] [address]          Radiology Studies: No results found.      Scheduled Meds:  ALPRAZolam  0.5 mg Oral QID   budesonide (PULMICORT) nebulizer solution  0.25 mg Nebulization BID   chlorhexidine  15 mL Mouth Rinse QID   Chlorhexidine Gluconate Cloth  6 each Topical  Daily   enoxaparin (LOVENOX) injection  40 mg Subcutaneous Q24H   famotidine  20 mg Oral QHS   feeding supplement  237 mL Oral TID BM   hydrocortisone sod succinate (SOLU-CORTEF) inj  50 mg Intravenous BID   ipratropium-albuterol  3 mL Nebulization BID   lamoTRIgine  100 mg Oral q morning   lipase/protease/amylase  72,000 Units Oral TID WC   mouth rinse  15 mL Mouth Rinse q12n4p   midodrine  10 mg Oral BID WC   multivitamin with minerals  1 tablet Oral q morning   sodium bicarbonate/sodium chloride   Mouth Rinse Q4H   valACYclovir  500 mg Oral q morning   Continuous Infusions:  sodium chloride 250 mL (07/08/21 2152)   ceFEPime (MAXIPIME) IV 2 g (07/09/21 1028)     LOS: 6 days    Time spent: 38 minutes  Georgette Shell, MD 07/09/2021, 1:11 PM

## 2021-07-10 ENCOUNTER — Inpatient Hospital Stay (HOSPITAL_COMMUNITY): Payer: Medicare Other

## 2021-07-10 DIAGNOSIS — J84112 Idiopathic pulmonary fibrosis: Secondary | ICD-10-CM

## 2021-07-10 DIAGNOSIS — J9601 Acute respiratory failure with hypoxia: Secondary | ICD-10-CM

## 2021-07-10 DIAGNOSIS — E440 Moderate protein-calorie malnutrition: Secondary | ICD-10-CM

## 2021-07-10 DIAGNOSIS — U071 COVID-19: Secondary | ICD-10-CM

## 2021-07-10 DIAGNOSIS — R6521 Severe sepsis with septic shock: Secondary | ICD-10-CM

## 2021-07-10 LAB — COMPREHENSIVE METABOLIC PANEL
ALT: 60 U/L — ABNORMAL HIGH (ref 0–44)
AST: 62 U/L — ABNORMAL HIGH (ref 15–41)
Albumin: 3.2 g/dL — ABNORMAL LOW (ref 3.5–5.0)
Alkaline Phosphatase: 103 U/L (ref 38–126)
Anion gap: 9 (ref 5–15)
BUN: 20 mg/dL (ref 8–23)
CO2: 27 mmol/L (ref 22–32)
Calcium: 8.8 mg/dL — ABNORMAL LOW (ref 8.9–10.3)
Chloride: 98 mmol/L (ref 98–111)
Creatinine, Ser: 0.48 mg/dL (ref 0.44–1.00)
GFR, Estimated: >60 mL/min (ref >60)
Glucose, Bld: 111 mg/dL — ABNORMAL HIGH (ref 70–99)
Potassium: 3.1 mmol/L — ABNORMAL LOW (ref 3.5–5.1)
Sodium: 134 mmol/L — ABNORMAL LOW (ref 135–145)
Total Bilirubin: 0.4 mg/dL (ref 0.3–1.2)
Total Protein: 6.9 g/dL (ref 6.5–8.1)

## 2021-07-10 LAB — CBC
HCT: 29.9 % — ABNORMAL LOW (ref 36.0–46.0)
Hemoglobin: 9.7 g/dL — ABNORMAL LOW (ref 12.0–15.0)
MCH: 29 pg (ref 26.0–34.0)
MCHC: 32.4 g/dL (ref 30.0–36.0)
MCV: 89.3 fL (ref 80.0–100.0)
Platelets: 169 10*3/uL (ref 150–400)
RBC: 3.35 MIL/uL — ABNORMAL LOW (ref 3.87–5.11)
RDW: 17.6 % — ABNORMAL HIGH (ref 11.5–15.5)
WBC: 2.1 10*3/uL — ABNORMAL LOW (ref 4.0–10.5)
nRBC: 0 % (ref 0.0–0.2)

## 2021-07-10 MED ORDER — TBO-FILGRASTIM 480 MCG/0.8ML ~~LOC~~ SOSY
480.0000 ug | PREFILLED_SYRINGE | Freq: Once | SUBCUTANEOUS | Status: AC
  Administered 2021-07-10: 480 ug via SUBCUTANEOUS
  Filled 2021-07-10 (×2): qty 0.8

## 2021-07-10 MED ORDER — LOPERAMIDE HCL 2 MG PO CAPS
2.0000 mg | ORAL_CAPSULE | ORAL | Status: DC | PRN
  Administered 2021-07-11: 2 mg via ORAL
  Filled 2021-07-10: qty 1

## 2021-07-10 MED ORDER — POTASSIUM CHLORIDE 10 MEQ/50ML IV SOLN
10.0000 meq | INTRAVENOUS | Status: AC
  Administered 2021-07-10 (×4): 10 meq via INTRAVENOUS
  Filled 2021-07-10 (×8): qty 50

## 2021-07-10 MED ORDER — ONDANSETRON HCL 4 MG/2ML IJ SOLN
4.0000 mg | Freq: Four times a day (QID) | INTRAMUSCULAR | Status: DC | PRN
  Administered 2021-07-10: 4 mg via INTRAVENOUS
  Filled 2021-07-10: qty 2

## 2021-07-10 MED ORDER — POTASSIUM CHLORIDE 10 MEQ/100ML IV SOLN
INTRAVENOUS | Status: AC
  Filled 2021-07-10: qty 400

## 2021-07-10 MED ORDER — POTASSIUM CHLORIDE CRYS ER 20 MEQ PO TBCR
40.0000 meq | EXTENDED_RELEASE_TABLET | Freq: Three times a day (TID) | ORAL | Status: DC
  Administered 2021-07-10: 40 meq via ORAL
  Filled 2021-07-10: qty 2

## 2021-07-10 NOTE — Telephone Encounter (Signed)
MR, please advise on email. Pt's daughter has reviewed notes from pt's chart and feels there is incorrect information. There is also another email from pt's daughter with other information. Thanks!

## 2021-07-10 NOTE — Telephone Encounter (Signed)
Dr. Chase Caller, please advise on pt email. Thanks.

## 2021-07-10 NOTE — Progress Notes (Signed)
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  Was treated with pressors and antibiotics.  She had lactic acidosis and elevated procalcitonin which is resolving.  CT scan of the chest showed pulmonary fibrosis ?  Interstitial lung disease.  Patchy consolidation in bilateral lower lobes.  Respiratory virus panel negative.  She was initially on Levophed, then started midodrine and hydrocortisone.   Assessment & Plan:   Septic shock Present on admission and secondary to pneumonia. Patient admitted to the ICU and started on norepinephrine for pressure support. Empiric hydrocortisone and midodrine started. Norepinephrine weaned off. Septic shock resolved with treatment of infection.  Community acquired pneumonia Patient treated empirically with cefepime and azithromycin. Completed course.  Acute respiratory failure with hypoxia Secondary to above and complicated by pulmonary fibrosis/COPD.  -Wean to room air as able.  Chest tightness Unsure of etiology. Improved spontaneously. Chest x-ray confirms known cancer. -EKG  Pulmonary fibrosis COPD Patient will need outpatient follow-up with pulmonology for newly diagnosed pulmonary fibrosis.  Lung cancer Patient follows with Dr. Marin Olp. On chemotherapy as an outpatient.  Anemia of chronic disease Possibly also related to chemotherapy. Currently stable.  Hypokalemia Supplementation as needed.  Anxiety Depression -Continue Xanax prn and lamotrigine  Neutropenia In setting of outpatient chemotherapy in addition to acute illness. -CBC with differential in AM  Elevated LFTs Mild. Improved.  Moderate  malnutrition Dietitian recommendations (07/04/21): Ensure Enlive TID, each supplement provides 350 kcal and 20 grams of protein.   DVT prophylaxis: Lovenox Code Status:   Code Status: DNR Family Communication: None at bedside Disposition Plan: Discharge home with home health PT/OT in 24 hours of blood pressure remains stable off of hydrocortisone and midodrine   Consultants:  PCCM Medical oncology  Procedures:    Antimicrobials: Valacyclovir Cefepime Azithromycin    Subjective: Some chest tightness this morning. No other concerns.  Objective: Vitals:   07/09/21 1920 07/09/21 2101 07/10/21 0916 07/10/21 1334  BP:  (!) 160/84  115/74  Pulse:  98  (!) 103  Resp:  14  18  Temp:  99 F (37.2 C)  98.5 F (36.9 C)  TempSrc:  Oral    SpO2: 95% 96% 96% 99%  Weight:      Height:        Intake/Output Summary (Last 24 hours) at 07/10/2021 1647 Last data filed at 07/10/2021 1426 Gross per 24 hour  Intake 900 ml  Output 550 ml  Net 350 ml   Filed Weights   07/07/21 0500 07/08/21 0458 07/09/21 0300  Weight: 53.3 kg 53.8 kg 53.1 kg    Examination:  General exam: Appears calm and comfortable Respiratory system: Left sided rales. Respiratory effort normal. Cardiovascular system: S1 & S2 heard, RRR. Gastrointestinal system: Abdomen is nondistended, soft and nontender. No organomegaly or masses felt. Normal bowel sounds heard. Central nervous system: Alert and oriented. No focal neurological deficits. Musculoskeletal: No edema. No calf tenderness Skin: No cyanosis. No rashes Psychiatry: Judgement and insight appear normal. Mood & affect appropriate.     Data Reviewed: I have personally reviewed following labs and imaging studies  CBC Lab Results  Component Value Date   WBC 2.1 (L) 07/10/2021   RBC 3.35 (L) 07/10/2021   HGB 9.7 (L)  07/10/2021   HCT 29.9 (L) 07/10/2021   MCV 89.3 07/10/2021   MCH 29.0 07/10/2021   PLT 169 07/10/2021   MCHC 32.4 07/10/2021   RDW  17.6 (H) 07/10/2021   LYMPHSABS 0.1 (L) 07/02/2021   MONOABS 0.3 07/02/2021   EOSABS 0.0 07/02/2021   BASOSABS 0.0 24/58/0998     Last metabolic panel Lab Results  Component Value Date   NA 134 (L) 07/10/2021   K 3.1 (L) 07/10/2021   CL 98 07/10/2021   CO2 27 07/10/2021   BUN 20 07/10/2021   CREATININE 0.48 07/10/2021   GLUCOSE 111 (H) 07/10/2021   GFRNONAA >60 07/10/2021   GFRAA >60 01/26/2020   CALCIUM 8.8 (L) 07/10/2021   PHOS 2.6 07/09/2021   PROT 6.9 07/10/2021   ALBUMIN 3.2 (L) 07/10/2021   LABGLOB 3.4 10/05/2020   AGRATIO 1.5 11/24/2019   BILITOT 0.4 07/10/2021   ALKPHOS 103 07/10/2021   AST 62 (H) 07/10/2021   ALT 60 (H) 07/10/2021   ANIONGAP 9 07/10/2021    CBG (last 3)  No results for input(s): GLUCAP in the last 72 hours.   GFR: Estimated Creatinine Clearance: 49.5 mL/min (by C-G formula based on SCr of 0.48 mg/dL).  Coagulation Profile: No results for input(s): INR, PROTIME in the last 168 hours.  Recent Results (from the past 240 hour(s))  Resp Panel by RT-PCR (Flu A&B, Covid) Peripheral     Status: None   Collection Time: 07/03/21 12:03 AM   Specimen: Peripheral; Nasopharyngeal(NP) swabs in vial transport medium  Result Value Ref Range Status   SARS Coronavirus 2 by RT PCR NEGATIVE NEGATIVE Final    Comment: (NOTE) SARS-CoV-2 target nucleic acids are NOT DETECTED.  The SARS-CoV-2 RNA is generally detectable in upper respiratory specimens during the acute phase of infection. The lowest concentration of SARS-CoV-2 viral copies this assay can detect is 138 copies/mL. A negative result does not preclude SARS-Cov-2 infection and should not be used as the sole basis for treatment or other patient management decisions. A negative result may occur with  improper specimen collection/handling, submission of specimen other than nasopharyngeal swab, presence of viral mutation(s) within the areas targeted by this assay, and inadequate number of  viral copies(<138 copies/mL). A negative result must be combined with clinical observations, patient history, and epidemiological information. The expected result is Negative.  Fact Sheet for Patients:  EntrepreneurPulse.com.au  Fact Sheet for Healthcare Providers:  IncredibleEmployment.be  This test is no t yet approved or cleared by the [place] FDA and  has been authorized for detection and/or diagnosis of SARS-CoV-2 by FDA under an Emergency Use Authorization (EUA). This EUA will remain  in effect (meaning this test can be used) for the duration of the COVID-19 declaration under Section 564(b)(1) of the Act, 21 U.S.C.section 360bbb-3(b)(1), unless the authorization is terminated  or revoked sooner.       Influenza A by PCR NEGATIVE NEGATIVE Final   Influenza B by PCR NEGATIVE NEGATIVE Final    Comment: (NOTE) The Xpert Xpress SARS-CoV-2/FLU/RSV plus assay is intended as an aid in the diagnosis of influenza from Nasopharyngeal swab specimens and should not be used as a sole basis for treatment. Nasal washings and aspirates are unacceptable for Xpert Xpress SARS-CoV-2/FLU/RSV testing.  Fact Sheet for Patients: EntrepreneurPulse.com.au  Fact Sheet for Healthcare Providers: IncredibleEmployment.be  This test is not yet approved or cleared by the [place] FDA and has been authorized for detection and/or diagnosis of SARS-CoV-2 by FDA under an Emergency  Use Authorization (EUA). This EUA will remain in effect (meaning this test can be used) for the duration of the COVID-19 declaration under Section 564(b)(1) of the Act, 21 U.S.C. section 360bbb-3(b)(1), unless the authorization is terminated or revoked.  Performed at [facility], [place] [address] [place], [place] [address]   Culture, blood (Routine x 2)     Status: None   Collection Time: 07/03/21 12:32 AM   Specimen: BLOOD   Result Value Ref Range Status   Specimen Description   Final    BLOOD RIGHT ANTECUBITAL Performed at [facility] [place] [address], [place], [place] [address]    Special Requests   Final    Blood Culture adequate volume BOTTLES DRAWN AEROBIC AND ANAEROBIC Performed at [place] [address], [place], [place] [address]    Culture   Final    NO GROWTH 5 DAYS Performed at [facility], [place] [address], [place], [place] [address]    Report Status 07/08/2021 FINAL  Final  Culture, blood (Routine x 2)     Status: None   Collection Time: 07/03/21 12:32 AM   Specimen: BLOOD  Result Value Ref Range Status   Specimen Description   Final    BLOOD BLOOD LEFT FOREARM Performed at [place] [address] [place], [place] [address]    Special Requests   Final    Blood Culture adequate volume BOTTLES DRAWN AEROBIC AND ANAEROBIC Performed at [place] [place] [address] [place], [place] [address]    Culture   Final    NO GROWTH 5 DAYS Performed at [facility], [place] [address], [place], [place] [address]    Report Status 07/08/2021 FINAL  Final  MRSA Next Gen by PCR, Nasal     Status: None   Collection Time: 07/03/21  8:25 AM   Specimen: Nasal Mucosa; Nasal Swab  Result Value Ref Range Status   MRSA by PCR Next Gen NOT DETECTED NOT DETECTED Final    Comment: (NOTE) The GeneXpert MRSA Assay (FDA approved for NASAL specimens only), is one component of a comprehensive MRSA colonization surveillance program. It is not intended to diagnose MRSA infection nor to guide or monitor treatment for MRSA infections. Test performance is not FDA approved in patients less than 59 years old. Performed at [facility], [place] [address], [place], [place] [address]   Respiratory (~20 pathogens) panel by PCR     Status: None   Collection Time: 07/05/21  1:18 PM   Specimen: Nasopharyngeal  Swab; Respiratory  Result Value Ref Range Status   Adenovirus NOT DETECTED NOT DETECTED Final   Coronavirus 229E NOT DETECTED NOT DETECTED Final    Comment: (NOTE) The Coronavirus on the Respiratory Panel, DOES NOT test for the novel  Coronavirus (2019 nCoV)    Coronavirus HKU1 NOT DETECTED NOT DETECTED Final   Coronavirus NL63 NOT DETECTED NOT DETECTED Final   Coronavirus OC43 NOT DETECTED NOT DETECTED Final   Metapneumovirus NOT DETECTED NOT DETECTED Final   Rhinovirus / Enterovirus NOT DETECTED NOT DETECTED Final   Influenza A NOT DETECTED NOT DETECTED Final   Influenza B NOT DETECTED NOT DETECTED Final   Parainfluenza Virus 1 NOT DETECTED NOT DETECTED Final   Parainfluenza Virus 2 NOT DETECTED NOT DETECTED Final   Parainfluenza Virus 3 NOT DETECTED NOT DETECTED Final   Parainfluenza Virus 4 NOT DETECTED NOT DETECTED Final   Respiratory Syncytial Virus  NOT DETECTED NOT DETECTED Final   Bordetella pertussis NOT DETECTED NOT DETECTED Final   Bordetella Parapertussis NOT DETECTED NOT DETECTED Final   Chlamydophila pneumoniae NOT DETECTED NOT DETECTED Final   Mycoplasma pneumoniae NOT DETECTED NOT DETECTED Final    Comment: Performed at [facility], [place] [address], [place], [place] [address]  Gastrointestinal Panel by PCR , Stool     Status: None   Collection Time: 07/06/21 11:30 AM   Specimen: Stool  Result Value Ref Range Status   Campylobacter species NOT DETECTED NOT DETECTED Final   Plesimonas shigelloides NOT DETECTED NOT DETECTED Final   Salmonella species NOT DETECTED NOT DETECTED Final   Yersinia enterocolitica NOT DETECTED NOT DETECTED Final   Vibrio species NOT DETECTED NOT DETECTED Final   Vibrio cholerae NOT DETECTED NOT DETECTED Final   Enteroaggregative E coli (EAEC) NOT DETECTED NOT DETECTED Final   Enteropathogenic E coli (EPEC) NOT DETECTED NOT DETECTED Final   Enterotoxigenic E coli (ETEC) NOT DETECTED NOT DETECTED Final   Shiga like toxin producing E  coli (STEC) NOT DETECTED NOT DETECTED Final   Shigella/Enteroinvasive E coli (EIEC) NOT DETECTED NOT DETECTED Final   Cryptosporidium NOT DETECTED NOT DETECTED Final   Cyclospora cayetanensis NOT DETECTED NOT DETECTED Final   Entamoeba histolytica NOT DETECTED NOT DETECTED Final   Giardia lamblia NOT DETECTED NOT DETECTED Final   Adenovirus F40/41 NOT DETECTED NOT DETECTED Final   Astrovirus NOT DETECTED NOT DETECTED Final   Norovirus GI/GII NOT DETECTED NOT DETECTED Final   Rotavirus A NOT DETECTED NOT DETECTED Final   Sapovirus (I, II, IV, and V) NOT DETECTED NOT DETECTED Final    Comment: Performed at [facility], [place] [place], [place] [address]        Radiology Studies: DG CHEST PORT 1 VIEW  Result Date: 07/10/2021 CLINICAL DATA:  New increased shortness of breath, mid chest pain this morning, LEFT lower lobe lung cancer EXAM: PORTABLE CHEST 1 VIEW COMPARISON:  Portable exam 0722 hours compared to 07/04/2021 Correlation: CT angio chest 03/23/2021 FINDINGS: RIGHT jugular Port-A-Cath with tip projecting over cavoatrial junction. Normal heart size mediastinal contours. Extensive chronic reticular interstitial lung disease changes throughout both lungs, pattern stable. Known LEFT lower lobe mass again seen. No definite superimposed acute infiltrate, pleural effusion, or pneumothorax. Bones demineralized. IMPRESSION: Known LEFT lower lobe neoplasm. Pulmonary fibrosis. No definite acute superimposed pulmonary infiltrate identified. Electronically Signed   By: Lavonia Dana M.D.   On: 07/10/2021 08:29        Scheduled Meds:  ALPRAZolam  0.5 mg Oral QID   budesonide (PULMICORT) nebulizer solution  0.25 mg Nebulization BID   chlorhexidine  15 mL Mouth Rinse QID   Chlorhexidine Gluconate Cloth  6 each Topical Daily   enoxaparin (LOVENOX) injection  40 mg Subcutaneous Q24H   famotidine  20 mg Oral QHS   feeding supplement  237 mL Oral TID BM   ipratropium-albuterol  3 mL  Nebulization BID   lamoTRIgine  100 mg Oral q morning   mouth rinse  15 mL Mouth Rinse q12n4p   multivitamin with minerals  1 tablet Oral q morning   sodium bicarbonate/sodium chloride   Mouth Rinse Q4H   valACYclovir  500 mg Oral q morning   Continuous Infusions:  sodium chloride 250 mL (07/10/21 1305)   potassium chloride Stopped (07/10/21 0840)     LOS: 7 days     Cordelia Poche, MD [facility] 07/10/2021, 4:47 PM  If  7PM-7AM, please contact night-coverage [email or web address]

## 2021-07-10 NOTE — Progress Notes (Signed)
Unfortunately, I did speak with Radiation Oncology yesterday.  They do not believe that she will be a candidate for any radiation therapy to the lung primary.  She is complaining of some chest discomfort today.  I am not sure exactly what this could be.  When I listen to her lungs, I can hear reflux issues.  As such I will know she may have reflux as a problem.  I would like to get a chest x-ray on her.  This was done this morning.  She has a left lower lobe lung mass.  There is no infiltrate.  She does have the pulmonary fibrosis changes.  Her potassium is little bit low today.  Potassium is 3.1.  She really cannot take oral potassium.  Her liver function studies are slightly elevated.  They have been for a little bit.  Her white cell count is trending downward.  Her white cell count is 2.1.  Hemoglobin 9.7.  Platelet count 169,000.  I think I would give her dose of Neupogen.  I know her white cell count will continue to drop.  She has had no fever.  I am not sure when she is going home.  I do worry little bit about her overall status going home.  I do not know if she is physically ready to go home.  Treating her in the future will certainly be difficult.  I am unsure how strong she will be to manage chemotherapy.  Her vital signs all look pretty stable.  Her temperature is 98.5.  Pulse 100.  Blood pressure 115/74.  Oxygen saturation is 99%.  Her lungs sound pretty clear bilaterally.  I really do not hear any wheezes.  Cardiac exam tachycardic but regular.  Abdomen is soft.  Bowel sounds are present.  Extremities shows no clubbing, cyanosis or edema.  Again, Molly Welch is recovering from this sepsis.  All cultures were negative.  She has underlying pulmonary fibrosis which is going to make further therapy quite difficult.  I probably would not think about doing a more treatment on her for least 2 or 3 more weeks.  We really have to let her recover from this hospitalization.  I do appreciate  the outstanding care that she is gotten from the staff up on [address].  Lattie Haw, MD  Psalm 62:2

## 2021-07-11 DIAGNOSIS — J9601 Acute respiratory failure with hypoxia: Secondary | ICD-10-CM | POA: Diagnosis not present

## 2021-07-11 LAB — CBC WITH DIFFERENTIAL/PLATELET
Abs Immature Granulocytes: 0.04 10*3/uL (ref 0.00–0.07)
Basophils Absolute: 0 10*3/uL (ref 0.0–0.1)
Basophils Relative: 1 %
Eosinophils Absolute: 0 10*3/uL (ref 0.0–0.5)
Eosinophils Relative: 1 %
HCT: 28.7 % — ABNORMAL LOW (ref 36.0–46.0)
Hemoglobin: 9.3 g/dL — ABNORMAL LOW (ref 12.0–15.0)
Immature Granulocytes: 1 %
Lymphocytes Relative: 46 %
Lymphs Abs: 1.7 10*3/uL (ref 0.7–4.0)
MCH: 29.1 pg (ref 26.0–34.0)
MCHC: 32.4 g/dL (ref 30.0–36.0)
MCV: 89.7 fL (ref 80.0–100.0)
Monocytes Absolute: 0.7 10*3/uL (ref 0.1–1.0)
Monocytes Relative: 17 %
Neutro Abs: 1.3 10*3/uL — ABNORMAL LOW (ref 1.7–7.7)
Neutrophils Relative %: 34 %
Platelets: 156 10*3/uL (ref 150–400)
RBC: 3.2 MIL/uL — ABNORMAL LOW (ref 3.87–5.11)
RDW: 17.7 % — ABNORMAL HIGH (ref 11.5–15.5)
WBC: 3.8 10*3/uL — ABNORMAL LOW (ref 4.0–10.5)
nRBC: 0 % (ref 0.0–0.2)

## 2021-07-11 LAB — COMPREHENSIVE METABOLIC PANEL
ALT: 56 U/L — ABNORMAL HIGH (ref 0–44)
AST: 52 U/L — ABNORMAL HIGH (ref 15–41)
Albumin: 2.9 g/dL — ABNORMAL LOW (ref 3.5–5.0)
Alkaline Phosphatase: 112 U/L (ref 38–126)
Anion gap: 9 (ref 5–15)
BUN: 16 mg/dL (ref 8–23)
CO2: 26 mmol/L (ref 22–32)
Calcium: 8.7 mg/dL — ABNORMAL LOW (ref 8.9–10.3)
Chloride: 100 mmol/L (ref 98–111)
Creatinine, Ser: 0.53 mg/dL (ref 0.44–1.00)
GFR, Estimated: >60 mL/min (ref >60)
Glucose, Bld: 138 mg/dL — ABNORMAL HIGH (ref 70–99)
Potassium: 3.1 mmol/L — ABNORMAL LOW (ref 3.5–5.1)
Sodium: 135 mmol/L (ref 135–145)
Total Bilirubin: 0.3 mg/dL (ref 0.3–1.2)
Total Protein: 6.5 g/dL (ref 6.5–8.1)

## 2021-07-11 MED ORDER — POTASSIUM CHLORIDE 10 MEQ/100ML IV SOLN
10.0000 meq | INTRAVENOUS | Status: AC
  Administered 2021-07-11 (×5): 10 meq via INTRAVENOUS
  Filled 2021-07-11 (×5): qty 100

## 2021-07-11 MED ORDER — DRONABINOL 2.5 MG PO CAPS
2.5000 mg | ORAL_CAPSULE | Freq: Two times a day (BID) | ORAL | Status: DC
  Administered 2021-07-11: 2.5 mg via ORAL
  Filled 2021-07-11: qty 1

## 2021-07-11 MED ORDER — HEPARIN SOD (PORK) LOCK FLUSH 100 UNIT/ML IV SOLN
500.0000 [IU] | INTRAVENOUS | Status: AC | PRN
  Administered 2021-07-11: 500 [IU]
  Filled 2021-07-11: qty 5

## 2021-07-11 MED ORDER — ENSURE ENLIVE PO LIQD: 237.0000 mL | Freq: Three times a day (TID) | ORAL | Status: AC

## 2021-07-11 MED ORDER — SACCHAROMYCES BOULARDII 250 MG PO CAPS
250.0000 mg | ORAL_CAPSULE | Freq: Two times a day (BID) | ORAL | Status: DC
  Administered 2021-07-11: 250 mg via ORAL
  Filled 2021-07-11: qty 1

## 2021-07-11 NOTE — Progress Notes (Signed)
Nutrition Follow-up  DOCUMENTATION CODES:   Non-severe (moderate) malnutrition in context of chronic illness  INTERVENTION:  - continue Ensure Enlive TID.   NUTRITION DIAGNOSIS:   Moderate Malnutrition related to chronic illness, cancer and cancer related treatments as evidenced by moderate fat depletion, moderate muscle depletion. -ongoing  GOAL:   Patient will meet greater than or equal to 90% of their needs -met on average with meals and Ensure supplements.   MONITOR:   PO intake, Supplement acceptance, Labs, Weight trends  REASON FOR ASSESSMENT:   Consult Assessment of nutrition requirement/status  ASSESSMENT:   76 y.o. female with medical history of stage IV lung cancer, tobacco abuse, anxiety, and depression. She presented to the ED via EMS due to weakness and malaise after chemo treatment. In the ED she was hypotensive and febrile with fever of 103 F.  Patient laying in bed sleeping and no visitors present at bedside. Did not awake patient. A bottle of Ensure was in front of her.  Patient seen by this RD on 12/29 when she was on 2W and able to have a conversation with her at that time.   Recently documented meal completion percentages: 12/30- 75% of breakfast and lunch (total of 965 kcal and 25 grams protein) 1/2- 100% of breakfast and lunch (total of 1564 kcal and 20 grams protein 1/3- 50% of breakfast (397 kcal and 6 grams protein)  She has been accepting Ensure 90% of the time offered. On 12/29 she had reported that she tries to drink Ensure TID at home.   Dr. Antonieta Pert note from this AM reviewed in detail. Outlines plan for marinol to be added.  Weight has been fluctuating slightly almost daily since admission. No information documented in the edema section of flow sheet.     Labs reviewed; K: 3.1 mmol/l, Ca: 8.7 mg/dl, LFTs slightly elevated but trending down from 1/4.  Medications reviewed; 2.5 mg marinol BID starting 1/5 at lunch, PRN imodium, 1 tablet  multivitamin with minerals/day, 10 mEq IV KCl x5 runs 1/5 and x4 runs on 1/4, 250 mg florastor BID.   Diet Order:   Diet Order             Diet regular Room service appropriate? Yes; Fluid consistency: Thin  Diet effective now                   EDUCATION NEEDS:   Education needs have been addressed  Skin:  Skin Assessment: Reviewed RN Assessment  Last BM:  1/5 (type 7 x1, medium amount)  Height:   Ht Readings from Last 1 Encounters:  07/02/21 _0  (1.575 m)    Weight:   Wt Readings from Last 1 Encounters:  07/11/21 52.7 kg     Estimated Nutritional Needs:  Kcal:  1750-1950 kcal Protein:  85-100 grams Fluid:  >/= 1.8 L/day     Jarome Matin, MS, RD, LDN Inpatient Clinical Dietitian RD pager # available in [place]  After hours/weekend pager # available in [facility]

## 2021-07-11 NOTE — Plan of Care (Signed)

## 2021-07-11 NOTE — Progress Notes (Signed)
SATURATION QUALIFICATIONS: (This note is used to comply with regulatory documentation for home oxygen)  Patient Saturations on Room Air at Rest = 83 %  Patient Saturations on Room Air while Ambulating = NT%  Patient Saturations on 4 Liters of oxygen while Ambulating = 92%  Please briefly explain why patient needs home oxygen:  Pt becomes SOB at rest on RA.

## 2021-07-11 NOTE — TOC Transition Note (Signed)
Transition of Care [facility]) - CM/SW Discharge Note   Patient Details  Name: Molly Welch MRN: [id number] Date of Birth: 1945-09-03  Transition of Care [facility]) CM/SW Contact:  Lennart Pall, LCSW Phone Number: 07/11/2021, 1:00 PM   Clinical Narrative:     Pt medically cleared for dc home today.  Agreeable to recommendations for home O2 and HHPT/OT follow up with no agency preference.  O2 order placed with [employer] and [employer] to provide [facility] services.  Pt and daughter aware/ agree.  No further TOC needs.  Final next level of care: Home w Home Health Services Barriers to Discharge: Barriers Resolved   Patient Goals and CMS Choice Patient states their goals for this hospitalization and ongoing recovery are:: return home   Choice offered to / list presented to : Patient  Discharge Placement                       Discharge Plan and Services In-house Referral: Clinical Social Work Discharge Planning Services: CM Consult            DME Arranged: Oxygen DME Agency: [employer] Date DME Agency Contacted: 07/11/21 Time DME Agency Contacted: [phone]   HH Arranged: OT, PT HH Agency: [place] Date [facility] Agency Contacted: 07/11/21 Time [place]: [phone] Representative spoke with at [place]: [place] ([place]) Interventions     Readmission Risk Interventions Readmission Risk Prevention Plan 07/09/2021  Transportation Screening Complete  Medication Review [employer]) Complete  PCP or Specialist appointment within 3-5 days of discharge Complete  HRI or [place] Complete  SW Recovery Care/Counseling Consult Complete  [place] Not Applicable  Some recent data might be hidden

## 2021-07-11 NOTE — Discharge Summary (Signed)
Physician Discharge Summary  Molly Welch IEP:[id number] DOB: July 26, 1945 DOA: 07/02/2021  PCP: Carolee Rota, NP  Admit date: 07/02/2021 Discharge date: 07/11/2021  Admitted From: Home Disposition: Home  Recommendations for Outpatient Follow-up:  Follow up with PCP in 1 week Follow up with pulmonology Please obtain BMP/CBC in one week Please follow up on the following pending results: None  Home Health: PT/OT Equipment/Devices: Oxygen  Discharge Condition: Stable CODE STATUS: DNR Diet recommendation: Regular diet   Brief/Interim Summary:  Admission HPI written by Renee Pain, MD   History of Present Illness This 76 y.o. RACE @GENDER @reformed  smoker presented to the [facility] Emergency Department via EMS with complaints of weakness and malaise after undergoing chemotherapy earlier in the day.  In the emergency department, the patient was found to be hypotensive and febrile.  She had a fever of 103 F at home.  She had increasing oxygen requirement and is currently on 100% nonrebreather.  She has been started on norepinephrine for blood pressure support.  Lactate was elevated.  She was started on cefepime/azithromycin.   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  She is currently on carboplatin/Taxol/Yervoy/Opdivo.   Hospital course:  Septic shock Present on admission and secondary to pneumonia. Patient admitted to the ICU and started on norepinephrine for pressure support. Empiric hydrocortisone and midodrine started. Norepinephrine weaned off. Septic shock resolved with treatment of infection.   Community acquired pneumonia Patient treated empirically with cefepime and azithromycin. Completed course.   Acute respiratory failure with hypoxia Secondary to above and complicated by pulmonary fibrosis/COPD. Attempted to wean to room air, but unable. Discharged with oxygen.    Chest tightness Unsure of etiology. Improved spontaneously. Chest x-ray confirms known cancer. EKG unchanged from baseline.   Pulmonary fibrosis COPD Patient will need outpatient follow-up with pulmonology for newly diagnosed pulmonary fibrosis.   Lung cancer Patient follows with Dr. Marin Olp. On chemotherapy as an outpatient.   Anemia of chronic disease Possibly also related to chemotherapy. Currently stable.   Hypokalemia Supplementation as needed.   Anxiety Depression Continue Xanax prn and lamotrigine   Neutropenia In setting of outpatient chemotherapy in addition to acute illness. CBC with differential in AM.   Elevated LFTs Mild. Improved.   Moderate malnutrition Dietitian recommendations (07/04/21): Ensure Enlive TID, each supplement provides 350 kcal and 20 grams of protein.   Discharge Instructions   Allergies as of 07/11/2021       Reactions   Ativan [lorazepam] Other (See Comments)   Doxycycline Swelling   Prednisone Other (See Comments)   Very emotional, crying, angry   Carbamates    Penicillin G Other (See Comments)   Sulfa Antibiotics Other (See Comments)   Penicillins Rash   Has patient had a PCN reaction causing immediate rash, facial/tongue/throat swelling, SOB or lightheadedness with hypotension: yes Has patient had a PCN reaction causing severe rash involving mucus membranes or skin necrosis: no Has patient had a PCN reaction that required hospitalization: no Has patient had a PCN reaction occurring within the last 10 years: no If all of the above answers are "NO", then may proceed with Cephalosporin use.   Sulfonamide Derivatives Rash        Medication List     STOP taking these medications    dexamethasone 4 MG tablet Commonly known as: DECADRON       TAKE these medications    acetaminophen 500 MG tablet  Commonly known as: TYLENOL Take 500 mg by mouth daily as needed for headache (pain).   ALPRAZolam 1 MG tablet Commonly  known as: XANAX Take 0.5-1 mg by mouth See admin instructions. Take one tablet (1 mg) by mouth at 5:30am, take 1/2 tablet (0.5 mg) at 7:30am, 3pm and at bedtime.   AMBULATORY NON FORMULARY MEDICATION Lift Chair Diagnosis   AMBULATORY NON FORMULARY MEDICATION Rollator Diagnosis G 62.89   azelastine 0.05 % ophthalmic solution Commonly known as: OPTIVAR Place 1 drop into both eyes 2 (two) times daily as needed (dry eyes/itching).   diclofenac Sodium 1 % Gel Commonly known as: VOLTAREN Apply 1 application topically at bedtime.   feeding supplement Liqd Take 237 mLs by mouth 3 (three) times daily between meals.   HYDROcodone-acetaminophen 5-325 MG tablet Commonly known as: NORCO/VICODIN Take 1 tablet by mouth every 4 (four) hours as needed for moderate pain.   lamoTRIgine 25 MG tablet Commonly known as: LAMICTAL Take 100 mg by mouth every morning.   lidocaine-prilocaine cream Commonly known as: EMLA Apply to affected area once   lipase/protease/amylase 36000 UNITS Cpep capsule Commonly known as: CREON Take 36,000 Units by mouth See admin instructions. Take two capsules (72000 units) by mouth three times daily with meals and one capsule (36000 units) with snacks   multivitamin with minerals Tabs tablet Take 1 tablet by mouth every morning. Centrum   ondansetron 8 MG tablet Commonly known as: ZOFRAN Take 1 tablet (8 mg total) by mouth 2 (two) times daily as needed for refractory nausea / vomiting. Start on day 3 after carboplatin chemo.   PROBIOTIC PO Take 1 tablet by mouth every morning.   prochlorperazine 10 MG tablet Commonly known as: COMPAZINE Take 1 tablet (10 mg total) by mouth every 6 (six) hours as needed (Nausea or vomiting).   Systane Overnight Therapy 0.3 % Gel ophthalmic ointment Generic drug: hypromellose Place 1 application into both eyes at bedtime as needed for dry eyes.   traMADol 50 MG tablet Commonly known as: ULTRAM Take 1 tablet (50 mg total) by  mouth every 6 (six) hours as needed.   TURMERIC PO Take 1 capsule by mouth daily.   valACYclovir 500 MG tablet Commonly known as: VALTREX Take 500 mg by mouth every morning.   vitamin C 1000 MG tablet Take 1,000 mg by mouth every morning.        Follow-up Information     Carolee Rota, NP. Schedule an appointment as soon as possible for a visit in 1 week(s).   Specialty: Nurse Practitioner Why: For hospital follow-up Contact information: [place] [address] [phone]         Brand Males, MD. Schedule an appointment as soon as possible for a visit in 1 week(s).   Specialty: Pulmonary Disease Why: Lung disease Contact information: [place] [address] [phone]         Care, [facility] Follow up.   Specialty: Home Health Services Why: to provide home health therapy Contact information: [address] [place] [address] [phone]                Allergies  Allergen Reactions   Ativan [Lorazepam] Other (See Comments)   Doxycycline Swelling   Prednisone Other (See Comments)    Very emotional, crying, angry   Carbamates    Penicillin G Other (See Comments)   Sulfa Antibiotics Other (See Comments)   Penicillins Rash  Has patient had a PCN reaction causing immediate rash, facial/tongue/throat swelling, SOB or lightheadedness with hypotension: yes Has patient had a PCN reaction causing severe rash involving mucus membranes or skin necrosis: no Has patient had a PCN reaction that required hospitalization: no Has patient had a PCN reaction occurring within the last 10 years: no If all of the above answers are "NO", then may proceed with Cephalosporin use.    Sulfonamide Derivatives Rash    Consultations: Pulmonology Medical oncology   Procedures/Studies: DG Chest 2 View  Result Date: 07/03/2021 CLINICAL DATA:  History of lung cancer status post new chemo treatment today  with subsequent fever, weakness and hypotension. EXAM: CHEST - 2 VIEW COMPARISON:  March 26, 2021 FINDINGS: A right-sided venous Port-A-Cath is in place. Its distal tip is limited in visualization secondary to the presence of an overlying cardiac lead. Marked severity diffuse bilateral infiltrates are noted. The pattern of distribution is more prominent within the bilateral lung bases and along the periphery of both lungs. This is markedly increased in severity when compared to the prior study. The heart size and mediastinal contours are within normal limits. The visualized skeletal structures are unremarkable. IMPRESSION: Marked severity diffuse bilateral infiltrates, with a suspected component of underlying pulmonary fibrosis, markedly increased in severity when compared to the prior study. Electronically Signed   By: Virgina Norfolk M.D.   On: 07/03/2021 00:53   CT Chest High Resolution  Result Date: 07/04/2021 CLINICAL DATA:  Inpatient. Left lung cancer. Interstitial lung disease. Hypoxemia. EXAM: CT CHEST WITHOUT CONTRAST TECHNIQUE: Multidetector CT imaging of the chest was performed following the standard protocol without intravenous contrast. High resolution imaging of the lungs, as well as inspiratory and expiratory imaging, was performed. COMPARISON:  Chest radiograph from earlier today. 06/04/2021 PET-CT. 03/23/2021 chest CT. FINDINGS: Cardiovascular: Normal heart size. No significant pericardial effusion/thickening. Left anterior descending and left circumflex coronary atherosclerosis. Right internal jugular Port-A-Cath terminates at the cavoatrial junction. Great vessels are normal in course and caliber. Mediastinum/Nodes: Subcentimeter partially calcified right thyroid nodule is stable. Not clinically significant; no follow-up imaging recommended (ref: J Am Coll Radiol. 2015 Feb;12(2): 143-50). Unremarkable esophagus. No axillary adenopathy. No pathologically enlarged mediastinal or discrete  hilar nodes on these noncontrast images. Lungs/Pleura: No pneumothorax. Small dependent bilateral pleural effusions, left greater than right. Moderate centrilobular and paraseptal emphysema. Irregular solid anterior left lower lobe 6.2 x 5.4 cm lung mass crossing the left major fissure into the lingula (series 10/image 93), previously 6.1 x 5.8 cm on 06/04/2021 PET-CT, not substantially changed. New mild to moderate patchy consolidation and volume loss in the dependent lower lobes bilaterally. No new significant pulmonary nodules. Background extensive patchy confluent subpleural reticulation and ground-glass opacity with associated moderate traction bronchiectasis, architectural distortion and volume loss. Mild basilar predominance to these findings. Widespread moderate honeycombing in both lungs, most prominent at the peripheral right lung base. No definite interval progression of the underlying fibrotic interstitial lung disease since 03/23/2021. No significant lobular air trapping or definite tracheobronchomalacia on the expiration sequence. Upper abdomen: Simple 2.5 cm lateral upper left renal cyst. Musculoskeletal: No aggressive appearing focal osseous lesions. Mild-to-moderate thoracic spondylosis. IMPRESSION: 1. Irregular solid anterior left lower lobe 6.2 x 5.4 cm lung mass crossing the left major fissure into the lingula, not substantially changed since 06/04/2021 PET-CT, compatible with known primary bronchogenic carcinoma. 2. New mild to moderate patchy consolidation and volume loss in the dependent lower lobes bilaterally, favor a combination of atelectasis and pneumonia. 3. Small dependent  bilateral pleural effusions, left greater than right. 4. Background extensive basilar predominant fibrotic interstitial lung disease with moderate honeycombing. No definite interval progression of the underlying fibrotic interstitial lung disease since 03/23/2021 chest CT. Findings are consistent with UIP per  consensus guidelines: Diagnosis of Idiopathic Pulmonary Fibrosis: An Official ATS/ERS/JRS/ALAT Clinical Practice Guideline. [place], Iss 5, [phone], Mar 07 2017. 5. Two-vessel coronary atherosclerosis. 6. Aortic Atherosclerosis (ICD10-I70.0) and Emphysema (ICD10-J43.9). Electronically Signed   By: Ilona Sorrel M.D.   On: 07/04/2021 20:40   DG CHEST PORT 1 VIEW  Result Date: 07/10/2021 CLINICAL DATA:  New increased shortness of breath, mid chest pain this morning, LEFT lower lobe lung cancer EXAM: PORTABLE CHEST 1 VIEW COMPARISON:  Portable exam 0722 hours compared to 07/04/2021 Correlation: CT angio chest 03/23/2021 FINDINGS: RIGHT jugular Port-A-Cath with tip projecting over cavoatrial junction. Normal heart size mediastinal contours. Extensive chronic reticular interstitial lung disease changes throughout both lungs, pattern stable. Known LEFT lower lobe mass again seen. No definite superimposed acute infiltrate, pleural effusion, or pneumothorax. Bones demineralized. IMPRESSION: Known LEFT lower lobe neoplasm. Pulmonary fibrosis. No definite acute superimposed pulmonary infiltrate identified. Electronically Signed   By: Lavonia Dana M.D.   On: 07/10/2021 08:29   DG Chest Port 1 View  Result Date: 07/04/2021 CLINICAL DATA:  Pulmonary edema. EXAM: PORTABLE CHEST 1 VIEW COMPARISON:  July 03, 2021. FINDINGS: The heart size and mediastinal contours are within normal limits. Right internal jugular Port-A-Cath is unchanged in position. Stable bilateral lung opacities are noted concerning for fibrosis and probable acute infiltrates. The visualized skeletal structures are unremarkable. IMPRESSION: Stable bilateral opacities as described above. Electronically Signed   By: Marijo Conception M.D.   On: 07/04/2021 08:08   ECHOCARDIOGRAM COMPLETE  Result Date: 07/04/2021    ECHOCARDIOGRAM REPORT   Patient Name:   Molly Welch Date of Exam: 07/04/2021 Medical Rec #:  [id number]         Height:       62.0 in Accession #:    [id number]       Weight:       125.2 lb Date of Birth:  Oct 02, 1945         BSA:          1.567 m Patient Age:    47 years         BP:           106/46 mmHg Patient Gender: F                HR:           96 bpm. Exam Location:  Inpatient Procedure: 2D Echo, 3D Echo, Cardiac Doppler, Color Doppler and Strain Analysis Indications:    Shock, Chemo  History:        Patient has no prior history of Echocardiogram examinations.  Sonographer:    Glo Herring Referring Phys: [id number] RAHUL P DESAI IMPRESSIONS  1. Left ventricular ejection fraction, by estimation, is 55 to 60%. The left ventricle has normal function. The left ventricle has no regional wall motion abnormalities. There is mild concentric left ventricular hypertrophy. Left ventricular diastolic parameters are consistent with Grade I diastolic dysfunction (impaired relaxation).  2. Right ventricular systolic function is normal. The right ventricular size is normal. There is normal pulmonary artery systolic pressure.  3. The mitral valve is normal in structure. Trivial mitral valve regurgitation. No evidence of mitral stenosis.  4. The aortic valve was not  well visualized. Aortic valve regurgitation is not visualized. No aortic stenosis is present.  5. The inferior vena cava is normal in size with greater than 50% respiratory variability, suggesting right atrial pressure of 3 mmHg. FINDINGS  Left Ventricle: Left ventricular ejection fraction, by estimation, is 55 to 60%. The left ventricle has normal function. The left ventricle has no regional wall motion abnormalities. The left ventricular internal cavity size was normal in size. There is  mild concentric left ventricular hypertrophy. Left ventricular diastolic parameters are consistent with Grade I diastolic dysfunction (impaired relaxation). Indeterminate filling pressures. Right Ventricle: The right ventricular size is normal. No increase in right ventricular wall  thickness. Right ventricular systolic function is normal. There is normal pulmonary artery systolic pressure. The tricuspid regurgitant velocity is 2.58 m/s, and  with an assumed right atrial pressure of 3 mmHg, the estimated right ventricular systolic pressure is 15.8 mmHg. Left Atrium: Left atrial size was normal in size. Right Atrium: Right atrial size was normal in size. Pericardium: There is no evidence of pericardial effusion. Mitral Valve: The mitral valve is normal in structure. Trivial mitral valve regurgitation. No evidence of mitral valve stenosis. Tricuspid Valve: The tricuspid valve is normal in structure. Tricuspid valve regurgitation is trivial. No evidence of tricuspid stenosis. Aortic Valve: The aortic valve was not well visualized. Aortic valve regurgitation is not visualized. No aortic stenosis is present. Aortic valve mean gradient measures 7.0 mmHg. Aortic valve peak gradient measures 13.4 mmHg. Aortic valve area, by VTI measures 2.05 cm. Pulmonic Valve: The pulmonic valve was normal in structure. Pulmonic valve regurgitation is not visualized. No evidence of pulmonic stenosis. Aorta: The aortic root is normal in size and structure. Venous: The inferior vena cava is normal in size with greater than 50% respiratory variability, suggesting right atrial pressure of 3 mmHg. IAS/Shunts: No atrial level shunt detected by color flow Doppler.  LEFT VENTRICLE PLAX 2D LVIDd:         3.90 cm   Diastology LVIDs:         2.50 cm   LV e' medial:    5.66 cm/s LV PW:         1.10 cm   LV E/e' medial:  13.6 LV IVS:        1.10 cm   LV e' lateral:   7.29 cm/s LVOT diam:     1.90 cm   LV E/e' lateral: 10.6 LV SV:         68 LV SV Index:   44 LVOT Area:     2.84 cm                           3D Volume EF:                          3D EF:        57 %                          LV EDV:       97 ml                          LV ESV:       42 ml  LV SV:        56 ml RIGHT VENTRICLE             IVC  RV Basal diam:  3.00 cm     IVC diam: 1.80 cm RV S prime:     15.10 cm/s LEFT ATRIUM             Index        RIGHT ATRIUM          Index LA diam:        2.40 cm 1.53 cm/m   RA Area:     8.35 cm LA Vol (A2C):   34.6 ml 22.08 ml/m  RA Volume:   13.50 ml 8.62 ml/m LA Vol (A4C):   24.7 ml 15.77 ml/m LA Biplane Vol: 31.8 ml 20.30 ml/m  AORTIC VALVE                     PULMONIC VALVE AV Area (Vmax):    2.12 cm      PV Vmax:       1.07 m/s AV Area (Vmean):   1.98 cm      PV Peak grad:  4.6 mmHg AV Area (VTI):     2.05 cm AV Vmax:           183.00 cm/s AV Vmean:          126.000 cm/s AV VTI:            0.334 m AV Peak Grad:      13.4 mmHg AV Mean Grad:      7.0 mmHg LVOT Vmax:         137.00 cm/s LVOT Vmean:        87.900 cm/s LVOT VTI:          0.241 m LVOT/AV VTI ratio: 0.72  AORTA Ao Root diam: 2.80 cm Ao Asc diam:  3.20 cm MITRAL VALVE               TRICUSPID VALVE MV Area (PHT): 4.49 cm    TR Peak grad:   26.6 mmHg MV Decel Time: 169 msec    TR Vmax:        258.00 cm/s MV E velocity: 77.10 cm/s MV A velocity: 78.80 cm/s  SHUNTS MV E/A ratio:  0.98        Systemic VTI:  0.24 m                            Systemic Diam: 1.90 cm Skeet Latch MD Electronically signed by Skeet Latch MD Signature Date/Time: 07/04/2021/1:54:02 PM    Final      Subjective: No significant issues overnight. No chest pain.  Discharge Exam: Vitals:   07/11/21 0615 07/11/21 0641  BP: 123/69   Pulse: 93   Resp: 18   Temp: 98.4 F (36.9 C)   SpO2: 94% 92%   Vitals:   07/10/21 2108 07/11/21 0301 07/11/21 0615 07/11/21 0641  BP:   123/69   Pulse: 100  93   Resp: 18  18   Temp:   98.4 F (36.9 C)   TempSrc:   Oral   SpO2: 94%  94% 92%  Weight:  52.7 kg    Height:        General: Pt is alert, awake, not in acute distress Cardiovascular: RRR, S1/S2 + Respiratory: LLL rales Abdominal: Soft, NT, ND, bowel sounds + Extremities: no edema,  no cyanosis    The results of significant diagnostics from this  hospitalization (including imaging, microbiology, ancillary and laboratory) are listed below for reference.     Microbiology: Recent Results (from the past 240 hour(s))  Resp Panel by RT-PCR (Flu A&B, Covid) Peripheral     Status: None   Collection Time: 07/03/21 12:03 AM   Specimen: Peripheral; Nasopharyngeal(NP) swabs in vial transport medium  Result Value Ref Range Status   SARS Coronavirus 2 by RT PCR NEGATIVE NEGATIVE Final    Comment: (NOTE) SARS-CoV-2 target nucleic acids are NOT DETECTED.  The SARS-CoV-2 RNA is generally detectable in upper respiratory specimens during the acute phase of infection. The lowest concentration of SARS-CoV-2 viral copies this assay can detect is 138 copies/mL. A negative result does not preclude SARS-Cov-2 infection and should not be used as the sole basis for treatment or other patient management decisions. A negative result may occur with  improper specimen collection/handling, submission of specimen other than nasopharyngeal swab, presence of viral mutation(s) within the areas targeted by this assay, and inadequate number of viral copies(<138 copies/mL). A negative result must be combined with clinical observations, patient history, and epidemiological information. The expected result is Negative.  Fact Sheet for Patients:  EntrepreneurPulse.com.au  Fact Sheet for Healthcare Providers:  IncredibleEmployment.be  This test is no t yet approved or cleared by the [place] FDA and  has been authorized for detection and/or diagnosis of SARS-CoV-2 by FDA under an Emergency Use Authorization (EUA). This EUA will remain  in effect (meaning this test can be used) for the duration of the COVID-19 declaration under Section 564(b)(1) of the Act, 21 U.S.C.section 360bbb-3(b)(1), unless the authorization is terminated  or revoked sooner.       Influenza A by PCR NEGATIVE NEGATIVE Final   Influenza B by PCR  NEGATIVE NEGATIVE Final    Comment: (NOTE) The Xpert Xpress SARS-CoV-2/FLU/RSV plus assay is intended as an aid in the diagnosis of influenza from Nasopharyngeal swab specimens and should not be used as a sole basis for treatment. Nasal washings and aspirates are unacceptable for Xpert Xpress SARS-CoV-2/FLU/RSV testing.  Fact Sheet for Patients: EntrepreneurPulse.com.au  Fact Sheet for Healthcare Providers: IncredibleEmployment.be  This test is not yet approved or cleared by the [place] FDA and has been authorized for detection and/or diagnosis of SARS-CoV-2 by FDA under an Emergency Use Authorization (EUA). This EUA will remain in effect (meaning this test can be used) for the duration of the COVID-19 declaration under Section 564(b)(1) of the Act, 21 U.S.C. section 360bbb-3(b)(1), unless the authorization is terminated or revoked.  Performed at [facility], [place] [address], [place], [place] [address]   Culture, blood (Routine x 2)     Status: None   Collection Time: 07/03/21 12:32 AM   Specimen: BLOOD  Result Value Ref Range Status   Specimen Description   Final    BLOOD RIGHT ANTECUBITAL Performed at [facility] [address], [place], [place] [address]    Special Requests   Final    Blood Culture adequate volume BOTTLES DRAWN AEROBIC AND ANAEROBIC Performed at [facility] [address], [place], [place] [address]    Culture   Final    NO GROWTH 5 DAYS Performed at [facility], [place] [address], [place], [place] [address]    Report Status 07/08/2021 FINAL  Final  Culture, blood (Routine x 2)     Status: None   Collection Time:  07/03/21 12:32 AM   Specimen: BLOOD  Result Value Ref Range Status   Specimen Description   Final    BLOOD BLOOD LEFT FOREARM Performed at [facility] [address] [place], [place] [address]    Special Requests    Final    Blood Culture adequate volume BOTTLES DRAWN AEROBIC AND ANAEROBIC Performed at [facility] [address] [place], [place] [address]    Culture   Final    NO GROWTH 5 DAYS Performed at [facility], [place] [address] [place], [place] [address]    Report Status 07/08/2021 FINAL  Final  MRSA Next Gen by PCR, Nasal     Status: None   Collection Time: 07/03/21  8:25 AM   Specimen: Nasal Mucosa; Nasal Swab  Result Value Ref Range Status   MRSA by PCR Next Gen NOT DETECTED NOT DETECTED Final    Comment: (NOTE) The GeneXpert MRSA Assay (FDA approved for NASAL specimens only), is one component of a comprehensive MRSA colonization surveillance program. It is not intended to diagnose MRSA infection nor to guide or monitor treatment for MRSA infections. Test performance is not FDA approved in patients less than 84 years old. Performed at [facility], [place] [address], [place], [place] [address]   Respiratory (~20 pathogens) panel by PCR     Status: None   Collection Time: 07/05/21  1:18 PM   Specimen: Nasopharyngeal Swab; Respiratory  Result Value Ref Range Status   Adenovirus NOT DETECTED NOT DETECTED Final   Coronavirus 229E NOT DETECTED NOT DETECTED Final    Comment: (NOTE) The Coronavirus on the Respiratory Panel, DOES NOT test for the novel  Coronavirus (2019 nCoV)    Coronavirus HKU1 NOT DETECTED NOT DETECTED Final   Coronavirus NL63 NOT DETECTED NOT DETECTED Final   Coronavirus OC43 NOT DETECTED NOT DETECTED Final   Metapneumovirus NOT DETECTED NOT DETECTED Final   Rhinovirus / Enterovirus NOT DETECTED NOT DETECTED Final   Influenza A NOT DETECTED NOT DETECTED Final   Influenza B NOT DETECTED NOT DETECTED Final   Parainfluenza Virus 1 NOT DETECTED NOT DETECTED Final   Parainfluenza Virus 2 NOT DETECTED NOT DETECTED Final   Parainfluenza Virus 3 NOT DETECTED NOT DETECTED Final   Parainfluenza Virus 4 NOT DETECTED NOT  DETECTED Final   Respiratory Syncytial Virus NOT DETECTED NOT DETECTED Final   Bordetella pertussis NOT DETECTED NOT DETECTED Final   Bordetella Parapertussis NOT DETECTED NOT DETECTED Final   Chlamydophila pneumoniae NOT DETECTED NOT DETECTED Final   Mycoplasma pneumoniae NOT DETECTED NOT DETECTED Final    Comment: Performed at [facility], [place]. [address] [place], [place] [address]  Gastrointestinal Panel by PCR , Stool     Status: None   Collection Time: 07/06/21 11:30 AM   Specimen: Stool  Result Value Ref Range Status   Campylobacter species NOT DETECTED NOT DETECTED Final   Plesimonas shigelloides NOT DETECTED NOT DETECTED Final   Salmonella species NOT DETECTED NOT DETECTED Final   Yersinia enterocolitica NOT DETECTED NOT DETECTED Final   Vibrio species NOT DETECTED NOT DETECTED Final   Vibrio cholerae NOT DETECTED NOT DETECTED Final   Enteroaggregative E coli (EAEC) NOT DETECTED NOT DETECTED Final   Enteropathogenic E coli (EPEC) NOT DETECTED NOT DETECTED Final   Enterotoxigenic E coli (ETEC) NOT DETECTED NOT DETECTED Final   Shiga like toxin producing E coli (STEC) NOT DETECTED NOT DETECTED Final   Shigella/Enteroinvasive E coli (  EIEC) NOT DETECTED NOT DETECTED Final   Cryptosporidium NOT DETECTED NOT DETECTED Final   Cyclospora cayetanensis NOT DETECTED NOT DETECTED Final   Entamoeba histolytica NOT DETECTED NOT DETECTED Final   Giardia lamblia NOT DETECTED NOT DETECTED Final   Adenovirus F40/41 NOT DETECTED NOT DETECTED Final   Astrovirus NOT DETECTED NOT DETECTED Final   Norovirus GI/GII NOT DETECTED NOT DETECTED Final   Rotavirus A NOT DETECTED NOT DETECTED Final   Sapovirus (I, II, IV, and V) NOT DETECTED NOT DETECTED Final    Comment: Performed at [facility], [address] [place], [place] [address]     Labs: BNP (last 3 results) Recent Labs    07/03/21 0031  BNP 97.4   Basic Metabolic Panel: Recent Labs  Lab 07/05/21 0438 07/06/21 0523  07/07/21 0500 07/08/21 0741 07/09/21 0349 07/10/21 0357 07/11/21 0329  NA 136 138 142 139 141 134* 135  K 4.5 4.1 3.7 3.1* 3.0* 3.1* 3.1*  CL 103 104 108 105 100 98 100  CO2 24 27 27 24 28 27 26   GLUCOSE 111* 116* 92 81 102* 111* 138*  BUN 27* 21 23 18 12 20 16   CREATININE 0.62 0.62 0.46 0.48 0.52 0.48 0.53  CALCIUM 8.4* 8.6* 8.6* 8.9 8.6* 8.8* 8.7*  MG 2.5* 2.0 1.9 1.7 1.8  --   --   PHOS 1.8* 2.9 2.1* 3.0 2.6  --   --    Liver Function Tests: Recent Labs  Lab 07/09/21 0349 07/10/21 0357 07/11/21 0329  AST 65* 62* 52*  ALT 54* 60* 56*  ALKPHOS 77 103 112  BILITOT 0.6 0.4 0.3  PROT 6.6 6.9 6.5  ALBUMIN 3.1* 3.2* 2.9*   No results for input(s): LIPASE, AMYLASE in the last 168 hours. No results for input(s): AMMONIA in the last 168 hours. CBC: Recent Labs  Lab 07/07/21 0500 07/08/21 0741 07/09/21 0349 07/10/21 0357 07/11/21 0329  WBC 4.6 3.7* 2.4* 2.1* 3.8*  NEUTROABS  --   --   --   --  1.3*  HGB 9.2* 9.6* 9.1* 9.7* 9.3*  HCT 29.0* 29.1* 28.2* 29.9* 28.7*  MCV 92.1 89.8 90.1 89.3 89.7  PLT 151 167 158 169 156   Cardiac Enzymes: No results for input(s): CKTOTAL, CKMB, CKMBINDEX, TROPONINI in the last 168 hours. BNP: Invalid input(s): POCBNP CBG: No results for input(s): GLUCAP in the last 168 hours. D-Dimer No results for input(s): DDIMER in the last 72 hours. Hgb A1c No results for input(s): HGBA1C in the last 72 hours. Lipid Profile No results for input(s): CHOL, HDL, LDLCALC, TRIG, CHOLHDL, LDLDIRECT in the last 72 hours. Thyroid function studies No results for input(s): TSH, T4TOTAL, T3FREE, THYROIDAB in the last 72 hours.  Invalid input(s): FREET3 Anemia work up No results for input(s): VITAMINB12, FOLATE, FERRITIN, TIBC, IRON, RETICCTPCT in the last 72 hours. Urinalysis    Component Value Date/Time   COLORURINE YELLOW 03/23/2021 1331   APPEARANCEUR CLEAR 03/23/2021 1331   LABSPEC 1.015 03/23/2021 1331   PHURINE 6.5 03/23/2021 1331   GLUCOSEU  NEGATIVE 03/23/2021 1331   HGBUR NEGATIVE 03/23/2021 1331   BILIRUBINUR NEGATIVE 03/23/2021 1331   BILIRUBINUR negative 04/30/2013 0947   KETONESUR NEGATIVE 03/23/2021 1331   PROTEINUR NEGATIVE 03/23/2021 1331   UROBILINOGEN 0.2 04/30/2013 0947   NITRITE NEGATIVE 03/23/2021 1331   LEUKOCYTESUR NEGATIVE 03/23/2021 1331   Sepsis Labs Invalid input(s): PROCALCITONIN,  WBC,  LACTICIDVEN Microbiology Recent Results (from the past 240 hour(s))  Resp Panel by RT-PCR (Flu  A&B, Covid) Peripheral     Status: None   Collection Time: 07/03/21 12:03 AM   Specimen: Peripheral; Nasopharyngeal(NP) swabs in vial transport medium  Result Value Ref Range Status   SARS Coronavirus 2 by RT PCR NEGATIVE NEGATIVE Final    Comment: (NOTE) SARS-CoV-2 target nucleic acids are NOT DETECTED.  The SARS-CoV-2 RNA is generally detectable in upper respiratory specimens during the acute phase of infection. The lowest concentration of SARS-CoV-2 viral copies this assay can detect is 138 copies/mL. A negative result does not preclude SARS-Cov-2 infection and should not be used as the sole basis for treatment or other patient management decisions. A negative result may occur with  improper specimen collection/handling, submission of specimen other than nasopharyngeal swab, presence of viral mutation(s) within the areas targeted by this assay, and inadequate number of viral copies(<138 copies/mL). A negative result must be combined with clinical observations, patient history, and epidemiological information. The expected result is Negative.  Fact Sheet for Patients:  EntrepreneurPulse.com.au  Fact Sheet for Healthcare Providers:  IncredibleEmployment.be  This test is no t yet approved or cleared by the [place] FDA and  has been authorized for detection and/or diagnosis of SARS-CoV-2 by FDA under an Emergency Use Authorization (EUA). This EUA will remain  in effect  (meaning this test can be used) for the duration of the COVID-19 declaration under Section 564(b)(1) of the Act, 21 U.S.C.section 360bbb-3(b)(1), unless the authorization is terminated  or revoked sooner.       Influenza A by PCR NEGATIVE NEGATIVE Final   Influenza B by PCR NEGATIVE NEGATIVE Final    Comment: (NOTE) The Xpert Xpress SARS-CoV-2/FLU/RSV plus assay is intended as an aid in the diagnosis of influenza from Nasopharyngeal swab specimens and should not be used as a sole basis for treatment. Nasal washings and aspirates are unacceptable for Xpert Xpress SARS-CoV-2/FLU/RSV testing.  Fact Sheet for Patients: EntrepreneurPulse.com.au  Fact Sheet for Healthcare Providers: IncredibleEmployment.be  This test is not yet approved or cleared by the [place] FDA and has been authorized for detection and/or diagnosis of SARS-CoV-2 by FDA under an Emergency Use Authorization (EUA). This EUA will remain in effect (meaning this test can be used) for the duration of the COVID-19 declaration under Section 564(b)(1) of the Act, 21 U.S.C. section 360bbb-3(b)(1), unless the authorization is terminated or revoked.  Performed at [facility], [place] [address] [place], [place] [address]   Culture, blood (Routine x 2)     Status: None   Collection Time: 07/03/21 12:32 AM   Specimen: BLOOD  Result Value Ref Range Status   Specimen Description   Final    BLOOD RIGHT ANTECUBITAL Performed at [place] [address], [place], [place] [address]    Special Requests   Final    Blood Culture adequate volume BOTTLES DRAWN AEROBIC AND ANAEROBIC Performed at [place] [address] [place], [place] [address]    Culture   Final    NO GROWTH 5 DAYS Performed at [facility], [place] [address] [place], [place] [address]    Report Status 07/08/2021 FINAL  Final  Culture, blood (Routine  x 2)     Status: None   Collection Time: 07/03/21 12:32 AM   Specimen: BLOOD  Result Value Ref Range Status   Specimen Description   Final    BLOOD BLOOD LEFT FOREARM Performed at [place] [address], [place],  [address]  Special Requests   Final    Blood Culture adequate volume BOTTLES DRAWN AEROBIC AND ANAEROBIC Performed at [place] [address], [place], [place] [address]    Culture   Final    NO GROWTH 5 DAYS Performed at [facility], [place] [address], [place], [place] [address]    Report Status 07/08/2021 FINAL  Final  MRSA Next Gen by PCR, Nasal     Status: None   Collection Time: 07/03/21  8:25 AM   Specimen: Nasal Mucosa; Nasal Swab  Result Value Ref Range Status   MRSA by PCR Next Gen NOT DETECTED NOT DETECTED Final    Comment: (NOTE) The GeneXpert MRSA Assay (FDA approved for NASAL specimens only), is one component of a comprehensive MRSA colonization surveillance program. It is not intended to diagnose MRSA infection nor to guide or monitor treatment for MRSA infections. Test performance is not FDA approved in patients less than 76 years old. Performed at [facility], [place] [address] [place], [place] [address]   Respiratory (~20 pathogens) panel by PCR     Status: None   Collection Time: 07/05/21  1:18 PM   Specimen: Nasopharyngeal Swab; Respiratory  Result Value Ref Range Status   Adenovirus NOT DETECTED NOT DETECTED Final   Coronavirus 229E NOT DETECTED NOT DETECTED Final    Comment: (NOTE) The Coronavirus on the Respiratory Panel, DOES NOT test for the novel  Coronavirus (2019 nCoV)    Coronavirus HKU1 NOT DETECTED NOT DETECTED Final   Coronavirus NL63 NOT DETECTED NOT DETECTED Final   Coronavirus OC43 NOT DETECTED NOT DETECTED Final   Metapneumovirus NOT DETECTED NOT DETECTED Final   Rhinovirus / Enterovirus NOT DETECTED NOT DETECTED Final   Influenza A NOT DETECTED  NOT DETECTED Final   Influenza B NOT DETECTED NOT DETECTED Final   Parainfluenza Virus 1 NOT DETECTED NOT DETECTED Final   Parainfluenza Virus 2 NOT DETECTED NOT DETECTED Final   Parainfluenza Virus 3 NOT DETECTED NOT DETECTED Final   Parainfluenza Virus 4 NOT DETECTED NOT DETECTED Final   Respiratory Syncytial Virus NOT DETECTED NOT DETECTED Final   Bordetella pertussis NOT DETECTED NOT DETECTED Final   Bordetella Parapertussis NOT DETECTED NOT DETECTED Final   Chlamydophila pneumoniae NOT DETECTED NOT DETECTED Final   Mycoplasma pneumoniae NOT DETECTED NOT DETECTED Final    Comment: Performed at [facility], [place]. [address] [place], [place] [address]  Gastrointestinal Panel by PCR , Stool     Status: None   Collection Time: 07/06/21 11:30 AM   Specimen: Stool  Result Value Ref Range Status   Campylobacter species NOT DETECTED NOT DETECTED Final   Plesimonas shigelloides NOT DETECTED NOT DETECTED Final   Salmonella species NOT DETECTED NOT DETECTED Final   Yersinia enterocolitica NOT DETECTED NOT DETECTED Final   Vibrio species NOT DETECTED NOT DETECTED Final   Vibrio cholerae NOT DETECTED NOT DETECTED Final   Enteroaggregative E coli (EAEC) NOT DETECTED NOT DETECTED Final   Enteropathogenic E coli (EPEC) NOT DETECTED NOT DETECTED Final   Enterotoxigenic E coli (ETEC) NOT DETECTED NOT DETECTED Final   Shiga like toxin producing E coli (STEC) NOT DETECTED NOT DETECTED Final   Shigella/Enteroinvasive E coli (EIEC) NOT DETECTED NOT DETECTED Final   Cryptosporidium NOT DETECTED NOT DETECTED Final   Cyclospora cayetanensis NOT DETECTED NOT DETECTED Final   Entamoeba histolytica NOT DETECTED NOT DETECTED Final   Giardia lamblia NOT DETECTED NOT DETECTED Final   Adenovirus  F40/41 NOT DETECTED NOT DETECTED Final   Astrovirus NOT DETECTED NOT DETECTED Final   Norovirus GI/GII NOT DETECTED NOT DETECTED Final   Rotavirus A NOT DETECTED NOT DETECTED Final   Sapovirus (I, II, IV, and V)  NOT DETECTED NOT DETECTED Final    Comment: Performed at [facility], [address] [place], [place] [address]     Time coordinating discharge: 35 minutes  SIGNED:   Cordelia Poche, MD [facility] 07/11/2021, 1:08 PM

## 2021-07-11 NOTE — Progress Notes (Signed)
BJ does look a bit better this morning.  Her chest x-ray was obtained which unremarkable.  There is no pneumonia.  She  says she had an EKG yesterday.  She got a dose of Neupogen.  Her white cell count 3.8 today.  Her potassium is still low at 3.1.  I do not think she is having any diarrhea.  Her LFTs are little bit better.  She is getting nebulizers this morning.  This is for the underlying pulmonary fibrosis.  She really needs a lot of physical therapy.  Her appetite still is not all that great.  Her albumin is only 2.9.  She just does not have too much of an appetite.  I may want to think about some Marinol for her.  Her vital signs are all stable.  Blood pressure 123/69.  Oxygen saturation 92%.  Temperature 98.4.  Her lungs sound pretty clear bilaterally.  She does have decent air movement bilaterally.  Cardiac exam regular rate and rhythm.  Abdomen is soft.  I am not sure when she is can be discharged.  Again I am not sure if we will be able to treat her again given her performance status.  Her nutritional status critical.  I know that she is getting outstanding care from the one of the staff up on [address].  Lattie Haw, MD  Psalms 118:[phone]

## 2021-07-11 NOTE — Progress Notes (Signed)
Physical Therapy Treatment Patient Details Name: Molly Welch MRN: [id number] DOB: October 31, 1945 Today's Date: 07/11/2021   History of Present Illness Pt is a 76 year old woman admitted with septic PNA, acute respiratory failure and cardiogenic shock on 07/03/21 in the setting of chemotherapy for lung cancer. Requires pressors. PMH: former smoker, anxiety depression.    PT Comments    Pt continues motivated and progressing with mobility but slowly 2* SOB with exertion.  Pt hopeful for dc home this date.     Recommendations for follow up therapy are one component of a multi-disciplinary discharge planning process, led by the attending physician.  Recommendations may be updated based on patient status, additional functional criteria and insurance authorization.  Follow Up Recommendations  Home health PT     Assistance Recommended at Discharge Frequent or constant Supervision/Assistance  Patient can return home with the following A little help with walking and/or transfers   Equipment Recommendations  None recommended by PT    Recommendations for Other Services       Precautions / Restrictions Precautions Precautions: Fall Precaution Comments: chemo precautions; monitor O2, anxiety Restrictions Weight Bearing Restrictions: No     Mobility  Bed Mobility Overal bed mobility: Needs Assistance Bed Mobility: Supine to Sit     Supine to sit: Supervision     General bed mobility comments: increased time and use of bedrail    Transfers Overall transfer level: Needs assistance Equipment used: Rolling walker (2 wheels) Transfers: Sit to/from Stand Sit to Stand: Min guard           General transfer comment: cues for use of hands    Ambulation/Gait Ambulation/Gait assistance: Min guard Gait Distance (Feet): 60 Feet (60' x 3 with seated rest breaks) Assistive device: Rolling walker (2 wheels) Gait Pattern/deviations: Step-through pattern;Decreased step length -  right;Decreased step length - left;Decreased stride length       General Gait Details: Steady assist only   Stairs             Wheelchair Mobility    Modified Rankin (Stroke Patients Only)       Balance Overall balance assessment: Needs assistance Sitting-balance support: No upper extremity supported;Feet supported Sitting balance-Leahy Scale: Good     Standing balance support: Reliant on assistive device for balance;During functional activity;Single extremity supported Standing balance-Leahy Scale: Poor                              Cognition Arousal/Alertness: Awake/alert Behavior During Therapy: Anxious;WFL for tasks assessed/performed;Restless                                            Exercises      General Comments        Pertinent Vitals/Pain Pain Assessment: Faces Faces Pain Scale: Hurts a little bit Pain Location: chest Pain Descriptors / Indicators: Discomfort;Sore Pain Intervention(s): Limited activity within patient's tolerance;Monitored during session    Home Living                          Prior Function            PT Goals (current goals can now be found in the care plan section) Acute Rehab PT Goals Patient Stated Goal: HOME PT Goal Formulation: With patient Time For Goal  Achievement: 07/18/21 Potential to Achieve Goals: Good Progress towards PT goals: Progressing toward goals    Frequency    Min 3X/week      PT Plan Current plan remains appropriate    Co-evaluation              AM-PAC PT "6 Clicks" Mobility   Outcome Measure  Help needed turning from your back to your side while in a flat bed without using bedrails?: A Little Help needed moving from lying on your back to sitting on the side of a flat bed without using bedrails?: A Little Help needed moving to and from a bed to a chair (including a wheelchair)?: A Little Help needed standing up from a chair using your arms  (e.g., wheelchair or bedside chair)?: A Little Help needed to walk in hospital room?: A Little Help needed climbing 3-5 steps with a railing? : A Lot 6 Click Score: 17    End of Session Equipment Utilized During Treatment: Gait belt;Oxygen Activity Tolerance: Patient limited by fatigue Patient left: in chair;with call bell/phone within reach;with family/visitor present Nurse Communication: Mobility status PT Visit Diagnosis: Difficulty in walking, not elsewhere classified (R26.2);Muscle weakness (generalized) (M62.81);Unsteadiness on feet (R26.81)     Time: 2952-8413 PT Time Calculation (min) (ACUTE ONLY): 30 min  Charges:  $Gait Training: 23-37 mins                     [place] Pager [phone] Office [phone]    Adrin Julian 07/11/2021, 12:11 PM

## 2021-07-11 NOTE — Progress Notes (Signed)
Note not needed.  Patient Details Name: Molly Welch MRN: [id number] DOB: [birth year]        Debe Coder 07/26/2021, 2:24 PM

## 2021-07-12 ENCOUNTER — Telehealth: Payer: Self-pay | Admitting: Internal Medicine

## 2021-07-12 NOTE — Telephone Encounter (Signed)
I have made updated edits to the hospital progress notes.  Please have the daughter [name] cross check and verify.  Again apologize for any miscommunication

## 2021-07-12 NOTE — Telephone Encounter (Signed)
APologies  - for chart not conveying correctly  1) stage IV lung cancer seems to have crept in into the statement on medical history was present in past on 07/04/2021.  I am investigating further with the nurse practitioner and we will appropriately change this.  2) "opioid dependence" -because of baseline medical history listed opioids I made the assumption that she was taking 4 cans of pain and it was chronic.  If she is taking this chronically then I can change it to " cancer pain or what ever the cause requiring chronic opioid intake:" But if it was only  short course then I will delete it.  Many times in critical care we have to go through every system and be pretty thorough and so oftentimes use everything is a checklist.  The intention here was not to miss convey information  3) COVID -I do not know where this is listed but I can certainly take a look and point to the physician/nurse practitioner/provider who wrote it   Please send this message back to me after talking to the daughter.  I need a few days to correct this

## 2021-07-15 DIAGNOSIS — J84112 Idiopathic pulmonary fibrosis: Secondary | ICD-10-CM | POA: Diagnosis not present

## 2021-07-15 DIAGNOSIS — J432 Centrilobular emphysema: Secondary | ICD-10-CM | POA: Diagnosis not present

## 2021-07-15 DIAGNOSIS — Z87891 Personal history of nicotine dependence: Secondary | ICD-10-CM | POA: Diagnosis not present

## 2021-07-15 DIAGNOSIS — E440 Moderate protein-calorie malnutrition: Secondary | ICD-10-CM | POA: Diagnosis not present

## 2021-07-15 DIAGNOSIS — M47814 Spondylosis without myelopathy or radiculopathy, thoracic region: Secondary | ICD-10-CM | POA: Diagnosis not present

## 2021-07-15 DIAGNOSIS — Z9181 History of falling: Secondary | ICD-10-CM | POA: Diagnosis not present

## 2021-07-15 DIAGNOSIS — D630 Anemia in neoplastic disease: Secondary | ICD-10-CM | POA: Diagnosis not present

## 2021-07-15 DIAGNOSIS — F32A Depression, unspecified: Secondary | ICD-10-CM | POA: Diagnosis not present

## 2021-07-15 DIAGNOSIS — M810 Age-related osteoporosis without current pathological fracture: Secondary | ICD-10-CM | POA: Diagnosis not present

## 2021-07-15 DIAGNOSIS — C3432 Malignant neoplasm of lower lobe, left bronchus or lung: Secondary | ICD-10-CM | POA: Diagnosis not present

## 2021-07-15 DIAGNOSIS — J9601 Acute respiratory failure with hypoxia: Secondary | ICD-10-CM | POA: Diagnosis not present

## 2021-07-15 DIAGNOSIS — Z9981 Dependence on supplemental oxygen: Secondary | ICD-10-CM | POA: Diagnosis not present

## 2021-07-15 NOTE — Telephone Encounter (Signed)
At this ponit I have corrected everything I can  with opioid and lung cancer stging.  I have made the smoking hx (she is quit and we have documented that ) but anotehr paragraph where it might be confusin. Corrected that too. Do not see the covid mention in our pccm notes. I had Whitney the appp cross check too

## 2021-07-16 DIAGNOSIS — J9601 Acute respiratory failure with hypoxia: Secondary | ICD-10-CM | POA: Diagnosis not present

## 2021-07-16 DIAGNOSIS — M810 Age-related osteoporosis without current pathological fracture: Secondary | ICD-10-CM | POA: Diagnosis not present

## 2021-07-16 DIAGNOSIS — E440 Moderate protein-calorie malnutrition: Secondary | ICD-10-CM | POA: Diagnosis not present

## 2021-07-16 DIAGNOSIS — M47814 Spondylosis without myelopathy or radiculopathy, thoracic region: Secondary | ICD-10-CM | POA: Diagnosis not present

## 2021-07-16 DIAGNOSIS — D630 Anemia in neoplastic disease: Secondary | ICD-10-CM | POA: Diagnosis not present

## 2021-07-16 DIAGNOSIS — C3432 Malignant neoplasm of lower lobe, left bronchus or lung: Secondary | ICD-10-CM | POA: Diagnosis not present

## 2021-07-16 DIAGNOSIS — Z9981 Dependence on supplemental oxygen: Secondary | ICD-10-CM | POA: Diagnosis not present

## 2021-07-16 DIAGNOSIS — J84112 Idiopathic pulmonary fibrosis: Secondary | ICD-10-CM | POA: Diagnosis not present

## 2021-07-16 DIAGNOSIS — Z87891 Personal history of nicotine dependence: Secondary | ICD-10-CM | POA: Diagnosis not present

## 2021-07-16 DIAGNOSIS — J432 Centrilobular emphysema: Secondary | ICD-10-CM | POA: Diagnosis not present

## 2021-07-16 DIAGNOSIS — Z9181 History of falling: Secondary | ICD-10-CM | POA: Diagnosis not present

## 2021-07-16 DIAGNOSIS — F32A Depression, unspecified: Secondary | ICD-10-CM | POA: Diagnosis not present

## 2021-07-19 DIAGNOSIS — M47814 Spondylosis without myelopathy or radiculopathy, thoracic region: Secondary | ICD-10-CM | POA: Diagnosis not present

## 2021-07-19 DIAGNOSIS — Z9181 History of falling: Secondary | ICD-10-CM | POA: Diagnosis not present

## 2021-07-19 DIAGNOSIS — Z87891 Personal history of nicotine dependence: Secondary | ICD-10-CM | POA: Diagnosis not present

## 2021-07-19 DIAGNOSIS — F32A Depression, unspecified: Secondary | ICD-10-CM | POA: Diagnosis not present

## 2021-07-19 DIAGNOSIS — E440 Moderate protein-calorie malnutrition: Secondary | ICD-10-CM | POA: Diagnosis not present

## 2021-07-19 DIAGNOSIS — D630 Anemia in neoplastic disease: Secondary | ICD-10-CM | POA: Diagnosis not present

## 2021-07-19 DIAGNOSIS — J84112 Idiopathic pulmonary fibrosis: Secondary | ICD-10-CM | POA: Diagnosis not present

## 2021-07-19 DIAGNOSIS — M810 Age-related osteoporosis without current pathological fracture: Secondary | ICD-10-CM | POA: Diagnosis not present

## 2021-07-19 DIAGNOSIS — C3432 Malignant neoplasm of lower lobe, left bronchus or lung: Secondary | ICD-10-CM | POA: Diagnosis not present

## 2021-07-19 DIAGNOSIS — Z9981 Dependence on supplemental oxygen: Secondary | ICD-10-CM | POA: Diagnosis not present

## 2021-07-19 DIAGNOSIS — J432 Centrilobular emphysema: Secondary | ICD-10-CM | POA: Diagnosis not present

## 2021-07-19 DIAGNOSIS — J9601 Acute respiratory failure with hypoxia: Secondary | ICD-10-CM | POA: Diagnosis not present

## 2021-07-23 DIAGNOSIS — R918 Other nonspecific abnormal finding of lung field: Secondary | ICD-10-CM | POA: Diagnosis not present

## 2021-07-23 DIAGNOSIS — J9601 Acute respiratory failure with hypoxia: Secondary | ICD-10-CM | POA: Diagnosis not present

## 2021-07-23 DIAGNOSIS — Z9981 Dependence on supplemental oxygen: Secondary | ICD-10-CM | POA: Diagnosis not present

## 2021-07-23 DIAGNOSIS — M47814 Spondylosis without myelopathy or radiculopathy, thoracic region: Secondary | ICD-10-CM | POA: Diagnosis not present

## 2021-07-23 DIAGNOSIS — E440 Moderate protein-calorie malnutrition: Secondary | ICD-10-CM | POA: Diagnosis not present

## 2021-07-23 DIAGNOSIS — M810 Age-related osteoporosis without current pathological fracture: Secondary | ICD-10-CM | POA: Diagnosis not present

## 2021-07-23 DIAGNOSIS — Z87891 Personal history of nicotine dependence: Secondary | ICD-10-CM | POA: Diagnosis not present

## 2021-07-23 DIAGNOSIS — D630 Anemia in neoplastic disease: Secondary | ICD-10-CM | POA: Diagnosis not present

## 2021-07-23 DIAGNOSIS — Z9181 History of falling: Secondary | ICD-10-CM | POA: Diagnosis not present

## 2021-07-23 DIAGNOSIS — L89151 Pressure ulcer of sacral region, stage 1: Secondary | ICD-10-CM | POA: Diagnosis not present

## 2021-07-23 DIAGNOSIS — F32A Depression, unspecified: Secondary | ICD-10-CM | POA: Diagnosis not present

## 2021-07-23 DIAGNOSIS — C3432 Malignant neoplasm of lower lobe, left bronchus or lung: Secondary | ICD-10-CM | POA: Diagnosis not present

## 2021-07-23 DIAGNOSIS — J432 Centrilobular emphysema: Secondary | ICD-10-CM | POA: Diagnosis not present

## 2021-07-23 DIAGNOSIS — J84112 Idiopathic pulmonary fibrosis: Secondary | ICD-10-CM | POA: Diagnosis not present

## 2021-07-24 DIAGNOSIS — M810 Age-related osteoporosis without current pathological fracture: Secondary | ICD-10-CM | POA: Diagnosis not present

## 2021-07-24 DIAGNOSIS — J9601 Acute respiratory failure with hypoxia: Secondary | ICD-10-CM | POA: Diagnosis not present

## 2021-07-24 DIAGNOSIS — F32A Depression, unspecified: Secondary | ICD-10-CM | POA: Diagnosis not present

## 2021-07-24 DIAGNOSIS — Z9181 History of falling: Secondary | ICD-10-CM | POA: Diagnosis not present

## 2021-07-24 DIAGNOSIS — J84112 Idiopathic pulmonary fibrosis: Secondary | ICD-10-CM | POA: Diagnosis not present

## 2021-07-24 DIAGNOSIS — Z9981 Dependence on supplemental oxygen: Secondary | ICD-10-CM | POA: Diagnosis not present

## 2021-07-24 DIAGNOSIS — C3432 Malignant neoplasm of lower lobe, left bronchus or lung: Secondary | ICD-10-CM | POA: Diagnosis not present

## 2021-07-24 DIAGNOSIS — M47814 Spondylosis without myelopathy or radiculopathy, thoracic region: Secondary | ICD-10-CM | POA: Diagnosis not present

## 2021-07-24 DIAGNOSIS — J432 Centrilobular emphysema: Secondary | ICD-10-CM | POA: Diagnosis not present

## 2021-07-24 DIAGNOSIS — Z87891 Personal history of nicotine dependence: Secondary | ICD-10-CM | POA: Diagnosis not present

## 2021-07-24 DIAGNOSIS — E440 Moderate protein-calorie malnutrition: Secondary | ICD-10-CM | POA: Diagnosis not present

## 2021-07-24 DIAGNOSIS — D630 Anemia in neoplastic disease: Secondary | ICD-10-CM | POA: Diagnosis not present

## 2021-07-25 ENCOUNTER — Telehealth: Payer: Self-pay | Admitting: *Deleted

## 2021-07-25 ENCOUNTER — Ambulatory Visit (INDEPENDENT_AMBULATORY_CARE_PROVIDER_SITE_OTHER): Payer: Medicare Other

## 2021-07-25 ENCOUNTER — Encounter: Payer: Self-pay | Admitting: Adult Health

## 2021-07-25 ENCOUNTER — Ambulatory Visit: Payer: Medicare Other | Admitting: Adult Health

## 2021-07-25 ENCOUNTER — Other Ambulatory Visit: Payer: Self-pay

## 2021-07-25 VITALS — BP 100/80 | HR 100 | Temp 97.6°F | Ht 62.0 in | Wt 107.4 lb

## 2021-07-25 DIAGNOSIS — J189 Pneumonia, unspecified organism: Secondary | ICD-10-CM

## 2021-07-25 DIAGNOSIS — J9611 Chronic respiratory failure with hypoxia: Secondary | ICD-10-CM | POA: Diagnosis not present

## 2021-07-25 DIAGNOSIS — E440 Moderate protein-calorie malnutrition: Secondary | ICD-10-CM

## 2021-07-25 DIAGNOSIS — J849 Interstitial pulmonary disease, unspecified: Secondary | ICD-10-CM | POA: Diagnosis not present

## 2021-07-25 DIAGNOSIS — J9601 Acute respiratory failure with hypoxia: Secondary | ICD-10-CM

## 2021-07-25 DIAGNOSIS — J84112 Idiopathic pulmonary fibrosis: Secondary | ICD-10-CM | POA: Diagnosis not present

## 2021-07-25 DIAGNOSIS — J9690 Respiratory failure, unspecified, unspecified whether with hypoxia or hypercapnia: Secondary | ICD-10-CM | POA: Diagnosis not present

## 2021-07-25 MED ORDER — ALBUTEROL SULFATE HFA 108 (90 BASE) MCG/ACT IN AERS: 1.0000 | INHALATION_SPRAY | Freq: Four times a day (QID) | RESPIRATORY_TRACT | 2 refills | Status: AC | PRN

## 2021-07-25 NOTE — Assessment & Plan Note (Signed)
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 Serology negative except for rheumatoid factor.  Her rheumatoid factor remains elevated.  Most recent RA factor  was during acute illness.  Would consider repeat rheumatoid factor on follow-up visit if remains elevated consider referral to rheumatology if patient is clinically more stable. Patient remains very weak and frail. On return visit we will discuss antifibrotic therapy to see if patient would be able to tolerate.

## 2021-07-25 NOTE — Assessment & Plan Note (Signed)
Continue on oxygen 3 L Unable to tolerate POC at this time.

## 2021-07-25 NOTE — Assessment & Plan Note (Signed)
High-protein diet 

## 2021-07-25 NOTE — Progress Notes (Signed)
ATC x1.  LVM to return call. 

## 2021-07-25 NOTE — Progress Notes (Signed)
@Patient  ID: Molly Welch, female    DOB: 1945-08-17, 76 y.o.   MRN: [id number]  Chief Complaint  Patient presents with   Hospitalization Follow-up    Referring provider: Carolee Rota, NP  HPI: 76 year old female former smoker seen for pulmonary consult during hospitalization earlier this month for acute respiratory failure secondary to septic shock with pneumonia. 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  (Initial diagnosis September 2022) Pulmonary fibrosis and COPD.   TEST/EVENTS :   07/25/2021  Patient returns for a post hospital follow-up.  Patient was admitted earlier this month for prolonged hospitalization with acute respiratory failure secondary to septic shock with pneumonia.  Patient has underlying stage IIIb squamous cell lung cancer of the left lower lobe undergoing active treatment prior to admission.  Patient presented with febrile illness , hypotension, lactic acidosis.  Patient was treated with aggressive IV antibiotics, initially needed pressure support and IV hydration.  High-resolution CT chest showed UIP ILD/IPF, with superimposed patchy consolidation of the left lower lobes.  Viral panel was negative.  Strep pneumonia and Legionella were negative.  Patient was seen by pulmonary and critical care.  Patient completed a full course of antibiotics.  Continue to have hypoxemia and required oxygen at discharge at 3 L.  Today in the office was unable to tolerate POC on pulsed oxygen required continuous flow at 3 L. Serology during hospitalization showed a negative ANA, CCP and anti-DNA. Rheumatoid factor was elevated during hospitalization with acute illness..  Rheumatoid factor was elevated in 2021. Patient continues to follow with oncology.  Chemo is currently on hold.  Patient says since discharge she is feeling some better but remains very weak with low energy and  activity tolerance.  Prior to admission patient was not on any inhalers.  She has quit smoking Her daughter is her caregiver.  She is beginning physical therapy and home health nursing at home  Says her appetite is improved with no nausea vomiting or diarrhea.  She is using a walker currently.    Allergies  Allergen Reactions   Ativan [Lorazepam] Other (See Comments)   Doxycycline Swelling   Prednisone Other (See Comments)    Very emotional, crying, angry   Carbamates    Penicillin G Other (See Comments)   Sulfa Antibiotics Other (See Comments)   Penicillins Rash    Has patient had a PCN reaction causing immediate rash, facial/tongue/throat swelling, SOB or lightheadedness with hypotension: yes Has patient had a PCN reaction causing severe rash involving mucus membranes or skin necrosis: no Has patient had a PCN reaction that required hospitalization: no Has patient had a PCN reaction occurring within the last 10 years: no If all of the above answers are "NO", then may proceed with Cephalosporin use.    Sulfonamide Derivatives Rash    Immunization History  Administered Date(s) Administered   Influenza Split 04/10/2014   Influenza, High Dose Seasonal PF 05/01/2015, 03/25/2016, 05/05/2017, 05/17/2018, 04/14/2019, 04/25/2020   Influenza-Unspecified 04/26/2013, 06/14/2021   PFIZER(Purple Top)SARS-COV-2 Vaccination 08/29/2019, 09/19/2019   Pneumococcal Conjugate-13 05/01/2015   Pneumococcal Polysaccharide-23 04/26/2013   Tdap 04/26/2013   Zoster, Live 05/01/2015, 01/06/2020, 03/09/2020    Past Medical History:  Diagnosis Date   Abnormal chest x-ray 07/08/2003   COPD changes (2005) + interstitial lung dz changes (2002)   Anxiety and depression    Prozac, celexa, paxil, wellbutrin, and buspar all failures.  Pt has stated xanax  is only med that helps.   Depression    Goals of care, counseling/discussion 04/19/2021   Lung cancer, lower lobe ([place]) 04/19/2021   Tobacco dependence      Tobacco History: Social History   Tobacco Use  Smoking Status Former   Packs/day: 0.50   Years: 55.00   Pack years: 27.50   Types: Cigarettes   Quit date: 03/07/2021   Years since quitting: 0.3  Smokeless Tobacco Never   Counseling given: Not Answered   Outpatient Medications Prior to Visit  Medication Sig Dispense Refill   acetaminophen (TYLENOL) 500 MG tablet Take 500 mg by mouth daily as needed for headache (pain).     ALPRAZolam (XANAX) 1 MG tablet Take 0.5-1 mg by mouth See admin instructions. Take one tablet (1 mg) by mouth at 5:30am, take 1/2 tablet (0.5 mg) at 7:30am, 3pm and at bedtime.     AMBULATORY NON FORMULARY MEDICATION Lift Chair Diagnosis 1 Device 0   AMBULATORY NON FORMULARY MEDICATION Rollator Diagnosis G 62.89 1 Device 0   Ascorbic Acid (VITAMIN C) 1000 MG tablet Take 1,000 mg by mouth every morning.     azelastine (OPTIVAR) 0.05 % ophthalmic solution Place 1 drop into both eyes 2 (two) times daily as needed (dry eyes/itching).     diclofenac Sodium (VOLTAREN) 1 % GEL Apply 1 application topically at bedtime.     feeding supplement (ENSURE ENLIVE / ENSURE PLUS) LIQD Take 237 mLs by mouth 3 (three) times daily between meals.     HYDROcodone-acetaminophen (NORCO/VICODIN) 5-325 MG tablet Take 1 tablet by mouth every 4 (four) hours as needed for moderate pain. 60 tablet 0   hypromellose (SYSTANE OVERNIGHT THERAPY) 0.3 % GEL ophthalmic ointment Place 1 application into both eyes at bedtime as needed for dry eyes.     lamoTRIgine (LAMICTAL) 25 MG tablet Take 100 mg by mouth every morning.     lidocaine-prilocaine (EMLA) cream Apply to affected area once 30 g 3   lipase/protease/amylase (CREON) 36000 UNITS CPEP capsule Take 36,000 Units by mouth See admin instructions. Take two capsules (72000 units) by mouth three times daily with meals and one capsule (36000 units) with snacks     Multiple Vitamin (MULTIVITAMIN WITH MINERALS) TABS tablet Take 1 tablet by mouth every  morning. Centrum     ondansetron (ZOFRAN) 8 MG tablet Take 1 tablet (8 mg total) by mouth 2 (two) times daily as needed for refractory nausea / vomiting. Start on day 3 after carboplatin chemo. 30 tablet 1   Probiotic Product (PROBIOTIC PO) Take 1 tablet by mouth every morning.     prochlorperazine (COMPAZINE) 10 MG tablet Take 1 tablet (10 mg total) by mouth every 6 (six) hours as needed (Nausea or vomiting). 30 tablet 1   TURMERIC PO Take 1 capsule by mouth daily.     valACYclovir (VALTREX) 500 MG tablet Take 500 mg by mouth every morning.     traMADol (ULTRAM) 50 MG tablet Take 1 tablet (50 mg total) by mouth every 6 (six) hours as needed. 60 tablet 0   No facility-administered medications prior to visit.     Review of Systems:   Constitutional:   No  weight loss, night sweats,  Fevers, chills,  +fatigue, or  lassitude.  HEENT:   No headaches,  Difficulty swallowing,  Tooth/dental problems, or  Sore throat,                No sneezing, itching, ear ache, nasal congestion, post nasal drip,  CV:  No chest pain,  Orthopnea, PND, swelling in lower extremities, anasarca, dizziness, palpitations, syncope.   GI  No heartburn, indigestion, abdominal pain, nausea, vomiting, diarrhea, change in bowel habits, loss of appetite, bloody stools.   Resp:   No chest wall deformity  Skin: no rash or lesions.  GU: no dysuria, change in color of urine, no urgency or frequency.  No flank pain, no hematuria   MS:  No joint pain or swelling.  No decreased range of motion.  No back pain.    Physical Exam  BP 100/80 (BP Location: Left Arm, Cuff Size: Normal)    Pulse (!) 101   GEN: A/Ox3; pleasant , NAD, frail and elderly, on oxygen, rolling walker   HEENT:  [place]/AT,   NOSE-clear, THROAT-clear, no lesions, no postnasal drip or exudate noted.   NECK:  Supple w/ fair ROM; no JVD; normal carotid impulses w/o bruits; no thyromegaly or nodules palpated; no lymphadenopathy.    RESP diffuse bilateral  crackles  no accessory muscle use, no dullness to percussion  CARD:  RRR, no m/r/g, no  peripheral edema, pulses intact, no cyanosis or clubbing.  GI:   Soft & nt; nml bowel sounds; no organomegaly or masses detected.   Musco: Warm bil, no deformities or joint swelling noted.   Neuro: alert, no focal deficits noted.    Skin: Warm, no lesions or rashes    Lab Results:  CBC   BMET     ProBNP No results found for: PROBNP  Imaging: DG Chest 2 View  Result Date: 07/03/2021 CLINICAL DATA:  History of lung cancer status post new chemo treatment today with subsequent fever, weakness and hypotension. EXAM: CHEST - 2 VIEW COMPARISON:  March 26, 2021 FINDINGS: A right-sided venous Port-A-Cath is in place. Its distal tip is limited in visualization secondary to the presence of an overlying cardiac lead. Marked severity diffuse bilateral infiltrates are noted. The pattern of distribution is more prominent within the bilateral lung bases and along the periphery of both lungs. This is markedly increased in severity when compared to the prior study. The heart size and mediastinal contours are within normal limits. The visualized skeletal structures are unremarkable. IMPRESSION: Marked severity diffuse bilateral infiltrates, with a suspected component of underlying pulmonary fibrosis, markedly increased in severity when compared to the prior study. Electronically Signed   By: Virgina Norfolk M.D.   On: 07/03/2021 00:53   CT Chest High Resolution  Result Date: 07/04/2021 CLINICAL DATA:  Inpatient. Left lung cancer. Interstitial lung disease. Hypoxemia. EXAM: CT CHEST WITHOUT CONTRAST TECHNIQUE: Multidetector CT imaging of the chest was performed following the standard protocol without intravenous contrast. High resolution imaging of the lungs, as well as inspiratory and expiratory imaging, was performed. COMPARISON:  Chest radiograph from earlier today. 06/04/2021 PET-CT. 03/23/2021 chest CT.  FINDINGS: Cardiovascular: Normal heart size. No significant pericardial effusion/thickening. Left anterior descending and left circumflex coronary atherosclerosis. Right internal jugular Port-A-Cath terminates at the cavoatrial junction. Great vessels are normal in course and caliber. Mediastinum/Nodes: Subcentimeter partially calcified right thyroid nodule is stable. Not clinically significant; no follow-up imaging recommended (ref: J Am Coll Radiol. 2015 Feb;12(2): 143-50). Unremarkable esophagus. No axillary adenopathy. No pathologically enlarged mediastinal or discrete hilar nodes on these noncontrast images. Lungs/Pleura: No pneumothorax. Small dependent bilateral pleural effusions, left greater than right. Moderate centrilobular and paraseptal emphysema. Irregular solid anterior left lower lobe 6.2 x 5.4 cm lung mass crossing the left major fissure into the lingula (series 10/image 93),  previously 6.1 x 5.8 cm on 06/04/2021 PET-CT, not substantially changed. New mild to moderate patchy consolidation and volume loss in the dependent lower lobes bilaterally. No new significant pulmonary nodules. Background extensive patchy confluent subpleural reticulation and ground-glass opacity with associated moderate traction bronchiectasis, architectural distortion and volume loss. Mild basilar predominance to these findings. Widespread moderate honeycombing in both lungs, most prominent at the peripheral right lung base. No definite interval progression of the underlying fibrotic interstitial lung disease since 03/23/2021. No significant lobular air trapping or definite tracheobronchomalacia on the expiration sequence. Upper abdomen: Simple 2.5 cm lateral upper left renal cyst. Musculoskeletal: No aggressive appearing focal osseous lesions. Mild-to-moderate thoracic spondylosis. IMPRESSION: 1. Irregular solid anterior left lower lobe 6.2 x 5.4 cm lung mass crossing the left major fissure into the lingula, not  substantially changed since 06/04/2021 PET-CT, compatible with known primary bronchogenic carcinoma. 2. New mild to moderate patchy consolidation and volume loss in the dependent lower lobes bilaterally, favor a combination of atelectasis and pneumonia. 3. Small dependent bilateral pleural effusions, left greater than right. 4. Background extensive basilar predominant fibrotic interstitial lung disease with moderate honeycombing. No definite interval progression of the underlying fibrotic interstitial lung disease since 03/23/2021 chest CT. Findings are consistent with UIP per consensus guidelines: Diagnosis of Idiopathic Pulmonary Fibrosis: An Official ATS/ERS/JRS/ALAT Clinical Practice Guideline. [place], Iss 5, [id number], Mar 07 2017. 5. Two-vessel coronary atherosclerosis. 6. Aortic Atherosclerosis (ICD10-I70.0) and Emphysema (ICD10-J43.9). Electronically Signed   By: Ilona Sorrel M.D.   On: 07/04/2021 20:40   DG CHEST PORT 1 VIEW  Result Date: 07/10/2021 CLINICAL DATA:  New increased shortness of breath, mid chest pain this morning, LEFT lower lobe lung cancer EXAM: PORTABLE CHEST 1 VIEW COMPARISON:  Portable exam 0722 hours compared to 07/04/2021 Correlation: CT angio chest 03/23/2021 FINDINGS: RIGHT jugular Port-A-Cath with tip projecting over cavoatrial junction. Normal heart size mediastinal contours. Extensive chronic reticular interstitial lung disease changes throughout both lungs, pattern stable. Known LEFT lower lobe mass again seen. No definite superimposed acute infiltrate, pleural effusion, or pneumothorax. Bones demineralized. IMPRESSION: Known LEFT lower lobe neoplasm. Pulmonary fibrosis. No definite acute superimposed pulmonary infiltrate identified. Electronically Signed   By: Lavonia Dana M.D.   On: 07/10/2021 08:29   DG Chest Port 1 View  Result Date: 07/04/2021 CLINICAL DATA:  Pulmonary edema. EXAM: PORTABLE CHEST 1 VIEW COMPARISON:  July 03, 2021.  FINDINGS: The heart size and mediastinal contours are within normal limits. Right internal jugular Port-A-Cath is unchanged in position. Stable bilateral lung opacities are noted concerning for fibrosis and probable acute infiltrates. The visualized skeletal structures are unremarkable. IMPRESSION: Stable bilateral opacities as described above. Electronically Signed   By: Marijo Conception M.D.   On: 07/04/2021 08:08   ECHOCARDIOGRAM COMPLETE  Result Date: 07/04/2021    ECHOCARDIOGRAM REPORT   Patient Name:   VINCENTINA SOLLERS Date of Exam: 07/04/2021 Medical Rec #:  [id number]        Height:       62.0 in Accession #:    [id number]       Weight:       125.2 lb Date of Birth:  1946/03/24         BSA:          1.567 m Patient Age:    89 years         BP:           106/46 mmHg Patient Gender:  F                HR:           96 bpm. Exam Location:  Inpatient Procedure: 2D Echo, 3D Echo, Cardiac Doppler, Color Doppler and Strain Analysis Indications:    Shock, Chemo  History:        Patient has no prior history of Echocardiogram examinations.  Sonographer:    Glo Herring Referring Phys: [id number] RAHUL P DESAI IMPRESSIONS  1. Left ventricular ejection fraction, by estimation, is 55 to 60%. The left ventricle has normal function. The left ventricle has no regional wall motion abnormalities. There is mild concentric left ventricular hypertrophy. Left ventricular diastolic parameters are consistent with Grade I diastolic dysfunction (impaired relaxation).  2. Right ventricular systolic function is normal. The right ventricular size is normal. There is normal pulmonary artery systolic pressure.  3. The mitral valve is normal in structure. Trivial mitral valve regurgitation. No evidence of mitral stenosis.  4. The aortic valve was not well visualized. Aortic valve regurgitation is not visualized. No aortic stenosis is present.  5. The inferior vena cava is normal in size with greater than 50% respiratory variability,  suggesting right atrial pressure of 3 mmHg. FINDINGS  Left Ventricle: Left ventricular ejection fraction, by estimation, is 55 to 60%. The left ventricle has normal function. The left ventricle has no regional wall motion abnormalities. The left ventricular internal cavity size was normal in size. There is  mild concentric left ventricular hypertrophy. Left ventricular diastolic parameters are consistent with Grade I diastolic dysfunction (impaired relaxation). Indeterminate filling pressures. Right Ventricle: The right ventricular size is normal. No increase in right ventricular wall thickness. Right ventricular systolic function is normal. There is normal pulmonary artery systolic pressure. The tricuspid regurgitant velocity is 2.58 m/s, and  with an assumed right atrial pressure of 3 mmHg, the estimated right ventricular systolic pressure is 62.7 mmHg. Left Atrium: Left atrial size was normal in size. Right Atrium: Right atrial size was normal in size. Pericardium: There is no evidence of pericardial effusion. Mitral Valve: The mitral valve is normal in structure. Trivial mitral valve regurgitation. No evidence of mitral valve stenosis. Tricuspid Valve: The tricuspid valve is normal in structure. Tricuspid valve regurgitation is trivial. No evidence of tricuspid stenosis. Aortic Valve: The aortic valve was not well visualized. Aortic valve regurgitation is not visualized. No aortic stenosis is present. Aortic valve mean gradient measures 7.0 mmHg. Aortic valve peak gradient measures 13.4 mmHg. Aortic valve area, by VTI measures 2.05 cm. Pulmonic Valve: The pulmonic valve was normal in structure. Pulmonic valve regurgitation is not visualized. No evidence of pulmonic stenosis. Aorta: The aortic root is normal in size and structure. Venous: The inferior vena cava is normal in size with greater than 50% respiratory variability, suggesting right atrial pressure of 3 mmHg. IAS/Shunts: No atrial level shunt detected  by color flow Doppler.  LEFT VENTRICLE PLAX 2D LVIDd:         3.90 cm   Diastology LVIDs:         2.50 cm   LV e' medial:    5.66 cm/s LV PW:         1.10 cm   LV E/e' medial:  13.6 LV IVS:        1.10 cm   LV e' lateral:   7.29 cm/s LVOT diam:     1.90 cm   LV E/e' lateral: 10.6 LV SV:  68 LV SV Index:   44 LVOT Area:     2.84 cm                           3D Volume EF:                          3D EF:        57 %                          LV EDV:       97 ml                          LV ESV:       42 ml                          LV SV:        56 ml RIGHT VENTRICLE             IVC RV Basal diam:  3.00 cm     IVC diam: 1.80 cm RV S prime:     15.10 cm/s LEFT ATRIUM             Index        RIGHT ATRIUM          Index LA diam:        2.40 cm 1.53 cm/m   RA Area:     8.35 cm LA Vol (A2C):   34.6 ml 22.08 ml/m  RA Volume:   13.50 ml 8.62 ml/m LA Vol (A4C):   24.7 ml 15.77 ml/m LA Biplane Vol: 31.8 ml 20.30 ml/m  AORTIC VALVE                     PULMONIC VALVE AV Area (Vmax):    2.12 cm      PV Vmax:       1.07 m/s AV Area (Vmean):   1.98 cm      PV Peak grad:  4.6 mmHg AV Area (VTI):     2.05 cm AV Vmax:           183.00 cm/s AV Vmean:          126.000 cm/s AV VTI:            0.334 m AV Peak Grad:      13.4 mmHg AV Mean Grad:      7.0 mmHg LVOT Vmax:         137.00 cm/s LVOT Vmean:        87.900 cm/s LVOT VTI:          0.241 m LVOT/AV VTI ratio: 0.72  AORTA Ao Root diam: 2.80 cm Ao Asc diam:  3.20 cm MITRAL VALVE               TRICUSPID VALVE MV Area (PHT): 4.49 cm    TR Peak grad:   26.6 mmHg MV Decel Time: 169 msec    TR Vmax:        258.00 cm/s MV E velocity: 77.10 cm/s MV A velocity: 78.80 cm/s  SHUNTS MV E/A ratio:  0.98        Systemic VTI:  0.24 m  Systemic Diam: 1.90 cm Skeet Latch MD Electronically signed by Skeet Latch MD Signature Date/Time: 07/04/2021/1:54:02 PM    Final     CARBOplatin (PARAPLATIN) 300 mg in sodium chloride 0.9 % 100 mL chemo infusion      Date Action Dose Route User   Discharged on 07/11/2021   Admitted on 07/02/2021   06/06/2021 1549 Infusion Verify (none) Intravenous Lucile Crater, RN   06/06/2021 1549 New Bag/Given 300 mg Intravenous Lucile Crater, RN      CARBOplatin (PARAPLATIN) 300 mg in sodium chloride 0.9 % 100 mL chemo infusion     Date Action Dose Route User   Discharged on 07/11/2021   Admitted on 07/02/2021   07/02/2021 1434 New Bag/Given 300 mg Intravenous Harris, Donnica P, RN      dexamethasone (DECADRON) 10 mg in sodium chloride 0.9 % 50 mL IVPB     Date Action Dose Route User   Discharged on 07/11/2021   Admitted on 07/02/2021   06/06/2021 1039 Rate/Dose Change (none) Intravenous Lucile Crater, RN   06/06/2021 1029 Infusion Verify (none) Intravenous Lucile Crater, RN   06/06/2021 1028 Rate/Dose Change (none) Intravenous Lucile Crater, RN   06/06/2021 1027 New Bag/Given 10 mg Intravenous Melton Krebs, RN      dexamethasone (DECADRON) 10 mg in sodium chloride 0.9 % 50 mL IVPB     Date Action Dose Route User   Discharged on 07/11/2021   Admitted on 07/02/2021   07/02/2021 0859 Rate/Dose Change (none) Intravenous San Morelle, RN   07/02/2021 1761 New Bag/Given 10 mg Intravenous Johny Drilling, RN      diphenhydrAMINE (BENADRYL) injection 12.5 mg     Date Action Dose Route User   Discharged on 07/11/2021   Admitted on 07/02/2021   06/06/2021 6073 Given 12.5 mg Intravenous Barbu, Teodora, RN      diphenhydrAMINE (BENADRYL) injection 12.5 mg     Date Action Dose Route User   Discharged on 07/11/2021   Admitted on 07/02/2021   07/02/2021 0853 Given 12.5 mg Intravenous Tildon Husky P, RN      famotidine (PEPCID) IVPB 20 mg in NS 100 mL IVPB     Date Action Dose Route User   Discharged on 07/11/2021   Admitted on 07/02/2021   06/06/2021 0954 Restarted (none) Intravenous Lucile Crater, RN   06/06/2021 7106 Rate/Dose Change (none) Intravenous Lucile Crater, RN   06/06/2021 2694 New  Bag/Given 20 mg Intravenous Melton Krebs, RN      famotidine (PEPCID) IVPB 20 mg in NS 100 mL IVPB     Date Action Dose Route User   Discharged on 07/11/2021   Admitted on 07/02/2021   07/02/2021 0931 Rate/Dose Change (none) Intravenous San Morelle, RN   07/02/2021 0915 Rate/Dose Change (none) Intravenous San Morelle, RN   07/02/2021 0915 New Bag/Given 20 mg Intravenous Lucile Crater, RN      fosaprepitant (EMEND) 150 mg in sodium chloride 0.9 % 145 mL IVPB     Date Action Dose Route User   Discharged on 07/11/2021   Admitted on 07/02/2021   06/06/2021 1002 Rate/Dose Change (none) Intravenous Lucile Crater, RN   06/06/2021 1001 New Bag/Given 150 mg Intravenous Melton Krebs, RN      fosaprepitant (EMEND) 150 mg in sodium chloride 0.9 % 145 mL IVPB     Date Action Dose Route User   Discharged on 07/11/2021   Admitted on 07/02/2021   07/02/2021 [phone]  New Bag/Given 150 mg Intravenous Johny Drilling, RN      heparin lock flush 100 unit/mL     Date Action Dose Route User   Discharged on 07/11/2021   Admitted on 07/02/2021   06/06/2021 1630 Given 500 Units Intracatheter Lucile Crater, RN      heparin lock flush 100 unit/mL     Date Action Dose Route User   Discharged on 07/11/2021   Admitted on 07/02/2021   07/02/2021 1509 Given 500 Units Intracatheter San Morelle, RN      ipilimumab (YERVOY) 50 mg in sodium chloride 0.9 % 50 mL chemo infusion     Date Action Dose Route User   Discharged on 07/11/2021   Admitted on 07/02/2021   06/06/2021 1211 Rate/Dose Change (none) Intravenous Lucile Crater, RN   06/06/2021 1211 Rate/Dose Change (none) Intravenous Lucile Crater, RN   06/06/2021 1141 New Bag/Given 50 mg Intravenous Shelda Altes, RN      ipilimumab (YERVOY) 50 mg in sodium chloride 0.9 % 25 mL chemo infusion     Date Action Dose Route User   Discharged on 07/11/2021   Admitted on 07/02/2021   07/02/2021 1141 Rate/Dose Change (none)  Intravenous San Morelle, RN   07/02/2021 1126 Rate/Dose Change (none) Intravenous San Morelle, RN   07/02/2021 1126 Rate/Dose Change (none) Intravenous San Morelle, RN   07/02/2021 1055 Rate/Dose Change (none) Intravenous San Morelle, RN   07/02/2021 1054 New Bag/Given 50 mg Intravenous Harris, Donnica P, RN      nivolumab (OPDIVO) 360 mg in sodium chloride 0.9 % 100 mL chemo infusion     Date Action Dose Route User   Discharged on 07/11/2021   Admitted on 07/02/2021   06/06/2021 1126 Rate/Dose Change (none) Intravenous Lucile Crater, RN   06/06/2021 1125 Rate/Dose Change (none) Intravenous Lucile Crater, RN   06/06/2021 1055 Rate/Dose Change (none) Intravenous Lucile Crater, RN   06/06/2021 1055 New Bag/Given 360 mg Intravenous Melton Krebs, RN      nivolumab (OPDIVO) 360 mg in sodium chloride 0.9 % 100 mL chemo infusion     Date Action Dose Route User   Discharged on 07/11/2021   Admitted on 07/02/2021   07/02/2021 1016 Rate/Dose Change (none) Intravenous San Morelle, RN   07/02/2021 1016 New Bag/Given 360 mg Intravenous Johny Drilling, RN      PACLitaxel (TAXOL) 222 mg in sodium chloride 0.9 % 250 mL chemo infusion (> 80mg /m2)     Date Action Dose Route User   Discharged on 07/11/2021   Admitted on 07/02/2021   06/06/2021 1541 Rate/Dose Change (none) Intravenous Lucile Crater, RN   06/06/2021 1529 Infusion Verify (none) Intravenous Lucile Crater, RN   06/06/2021 1235 Rate/Dose Change (none) Intravenous Lucile Crater, RN   06/06/2021 1234 New Bag/Given 222 mg Intravenous Shelda Altes, RN      PACLitaxel (TAXOL) 222 mg in sodium chloride 0.9 % 250 mL chemo infusion (> 80mg /m2)     Date Action Dose Route User   Discharged on 07/11/2021   Admitted on 07/02/2021   07/02/2021 1253 Rate/Dose Change (none) Intravenous San Morelle, RN   07/02/2021 1148 Rate/Dose Change (none) Intravenous San Morelle, RN   07/02/2021 1147 New Bag/Given  222 mg Intravenous Lucile Crater, RN      palonosetron (ALOXI) injection 0.25 mg     Date Action Dose Route User   Discharged on 07/11/2021  Admitted on 07/02/2021   06/06/2021 0919 Given 0.25 mg Intravenous Melton Krebs, RN      palonosetron (ALOXI) injection 0.25 mg     Date Action Dose Route User   Discharged on 07/11/2021   Admitted on 07/02/2021   07/02/2021 4327 Given 0.25 mg Intravenous Tildon Husky P, RN      0.9 %  sodium chloride infusion     Date Action Dose Route User   Discharged on 07/11/2021   Admitted on 07/02/2021   06/06/2021 1628 Rate/Dose Change (none) Intravenous Lucile Crater, RN   06/06/2021 1626 Infusion Verify (none) Intravenous Lucile Crater, RN   06/06/2021 1625 Infusion Verify (none) Intravenous Lucile Crater, RN   06/06/2021 1625 Restarted (none) Intravenous Lucile Crater, RN   06/06/2021 1548 Infusion Verify (none) Intravenous Lucile Crater, RN      0.9 %  sodium chloride infusion     Date Action Dose Route User   Discharged on 07/11/2021   Admitted on 07/02/2021   07/02/2021 1506 Rate/Dose Change (none) Intravenous San Morelle, RN   07/02/2021 1506 Restarted (none) Intravenous San Morelle, RN   07/02/2021 1433 Infusion Verify (none) Intravenous San Morelle, RN   07/02/2021 1430 Restarted (none) Intravenous San Morelle, RN   07/02/2021 1247 Infusion Verify (none) Intravenous San Morelle, RN      sodium chloride flush (NS) 0.9 % injection 10 mL     Date Action Dose Route User   Discharged on 07/11/2021   Admitted on 07/02/2021   06/06/2021 1630 Given 10 mL Intracatheter Lucile Crater, RN      sodium chloride flush (NS) 0.9 % injection 10 mL     Date Action Dose Route User   Discharged on 07/11/2021   Admitted on 07/02/2021   07/02/2021 1509 Given 10 mL Intracatheter San Morelle, RN       No flowsheet data found.  No results found for: NITRICOXIDE      Assessment & Plan:   No  problem-specific Assessment & Plan notes found for this encounter.     Rexene Edison, NP 07/25/2021

## 2021-07-25 NOTE — Assessment & Plan Note (Signed)
Recent admission with septic shock with pneumonia.  Patient is clinically improved.  Has completed a full course of antibiotics.  Chest x-ray today.

## 2021-07-25 NOTE — Patient Instructions (Signed)
Chest xray today  Continue on Oxygen 3l/m  Activity as tolerated.  Albuterol inhaler As needed  wheezing/shortness of breath  Saline nasal spray Twice daily   Saline nasal gel At bedtime   Follow up with Dr. Chase Caller in 1 month as planned and As needed   Please contact office for sooner follow up if symptoms do not improve or worsen or seek emergency care

## 2021-07-25 NOTE — Telephone Encounter (Signed)
DG Chest 2 View: Result Notes  Vanessa Barbara, RN  07/25/2021  4:53 PM EST     ATC x1.  LVM to return call.   Melvenia Needles, NP  07/25/2021  3:15 PM EST     Chest x-ray shows diffuse pulmonary fibrosis/interstitial lung disease.  There is increased markings on the mid lungs.  She has completed a full course of antibiotics for pneumonia.  Clinically patient was improved at office visit.  Continue with close follow-up. Will continue with office visit recommendations and follow-up.   Please contact office for sooner follow up if symptoms do not improve or worsen or seek emergency care

## 2021-07-26 NOTE — Telephone Encounter (Signed)
Called pt and spoke with daughter [name] letting her know the results of pt's cxr and she verbalized understanding. Nothing further needed.

## 2021-07-29 ENCOUNTER — Other Ambulatory Visit: Payer: Self-pay | Admitting: Hematology & Oncology

## 2021-07-29 ENCOUNTER — Encounter: Payer: Self-pay | Admitting: Hematology & Oncology

## 2021-07-29 ENCOUNTER — Inpatient Hospital Stay: Payer: Medicare Other | Attending: Hematology and Oncology | Admitting: Hematology & Oncology

## 2021-07-29 ENCOUNTER — Inpatient Hospital Stay: Payer: Medicare Other

## 2021-07-29 ENCOUNTER — Other Ambulatory Visit: Payer: Self-pay

## 2021-07-29 ENCOUNTER — Telehealth: Payer: Self-pay | Admitting: *Deleted

## 2021-07-29 ENCOUNTER — Other Ambulatory Visit: Payer: Self-pay | Admitting: *Deleted

## 2021-07-29 VITALS — BP 126/64 | HR 95 | Temp 97.7°F | Resp 20 | Wt 107.0 lb

## 2021-07-29 DIAGNOSIS — E860 Dehydration: Secondary | ICD-10-CM | POA: Diagnosis not present

## 2021-07-29 DIAGNOSIS — Z79899 Other long term (current) drug therapy: Secondary | ICD-10-CM | POA: Diagnosis not present

## 2021-07-29 DIAGNOSIS — C3432 Malignant neoplasm of lower lobe, left bronchus or lung: Secondary | ICD-10-CM | POA: Insufficient documentation

## 2021-07-29 DIAGNOSIS — C3431 Malignant neoplasm of lower lobe, right bronchus or lung: Secondary | ICD-10-CM | POA: Diagnosis not present

## 2021-07-29 DIAGNOSIS — C3430 Malignant neoplasm of lower lobe, unspecified bronchus or lung: Secondary | ICD-10-CM

## 2021-07-29 LAB — CBC WITH DIFFERENTIAL (CANCER CENTER ONLY)
Abs Immature Granulocytes: 0.02 10*3/uL (ref 0.00–0.07)
Basophils Absolute: 0.1 10*3/uL (ref 0.0–0.1)
Basophils Relative: 1 %
Eosinophils Absolute: 0.3 10*3/uL (ref 0.0–0.5)
Eosinophils Relative: 4 %
HCT: 34.3 % — ABNORMAL LOW (ref 36.0–46.0)
Hemoglobin: 10.8 g/dL — ABNORMAL LOW (ref 12.0–15.0)
Immature Granulocytes: 0 %
Lymphocytes Relative: 29 %
Lymphs Abs: 2.3 10*3/uL (ref 0.7–4.0)
MCH: 29 pg (ref 26.0–34.0)
MCHC: 31.5 g/dL (ref 30.0–36.0)
MCV: 92.2 fL (ref 80.0–100.0)
Monocytes Absolute: 1.2 10*3/uL — ABNORMAL HIGH (ref 0.1–1.0)
Monocytes Relative: 15 %
Neutro Abs: 4.2 10*3/uL (ref 1.7–7.7)
Neutrophils Relative %: 51 %
Platelet Count: 280 10*3/uL (ref 150–400)
RBC: 3.72 MIL/uL — ABNORMAL LOW (ref 3.87–5.11)
RDW: 16.6 % — ABNORMAL HIGH (ref 11.5–15.5)
WBC Count: 8.1 10*3/uL (ref 4.0–10.5)
nRBC: 0 % (ref 0.0–0.2)

## 2021-07-29 LAB — CMP (CANCER CENTER ONLY)
ALT: 12 U/L (ref 0–44)
AST: 23 U/L (ref 15–41)
Albumin: 3.9 g/dL (ref 3.5–5.0)
Alkaline Phosphatase: 95 U/L (ref 38–126)
Anion gap: 5 (ref 5–15)
BUN: 11 mg/dL (ref 8–23)
CO2: 36 mmol/L — ABNORMAL HIGH (ref 22–32)
Calcium: 11.5 mg/dL — ABNORMAL HIGH (ref 8.9–10.3)
Chloride: 96 mmol/L — ABNORMAL LOW (ref 98–111)
Creatinine: 0.68 mg/dL (ref 0.44–1.00)
GFR, Estimated: >60 mL/min (ref >60)
Glucose, Bld: 95 mg/dL (ref 70–99)
Potassium: 3.8 mmol/L (ref 3.5–5.1)
Sodium: 137 mmol/L (ref 135–145)
Total Bilirubin: 0.3 mg/dL (ref 0.3–1.2)
Total Protein: 7.8 g/dL (ref 6.5–8.1)

## 2021-07-29 LAB — LACTATE DEHYDROGENASE: LDH: 211 U/L — ABNORMAL HIGH (ref 98–192)

## 2021-07-29 LAB — TSH: TSH: 1.328 u[IU]/mL (ref 0.308–3.960)

## 2021-07-29 MED ORDER — SODIUM CHLORIDE 0.9 % IV SOLN: INTRAVENOUS | Status: AC

## 2021-07-29 MED ORDER — SODIUM CHLORIDE 0.9% FLUSH
10.0000 mL | Freq: Once | INTRAVENOUS | Status: AC
  Administered 2021-07-29: 10 mL via INTRAVENOUS

## 2021-07-29 MED ORDER — HEPARIN SOD (PORK) LOCK FLUSH 100 UNIT/ML IV SOLN
500.0000 [IU] | Freq: Once | INTRAVENOUS | Status: AC
  Administered 2021-07-29: 500 [IU] via INTRAVENOUS

## 2021-07-29 MED ORDER — ZOLEDRONIC ACID 4 MG/100ML IV SOLN
4.0000 mg | Freq: Once | INTRAVENOUS | Status: AC
  Administered 2021-07-29: 4 mg via INTRAVENOUS
  Filled 2021-07-29: qty 100

## 2021-07-29 NOTE — Progress Notes (Signed)
Hematology and Oncology Follow Up Visit  Molly Welch [id number] 12-Dec-1945 76 y.o. 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  Unfortunately, she is declining.  She was in the hospital recently.  She was then because of complications.  She had pneumonia.  She has underlying interstitial lung disease.  This may have been from the immunotherapy that she had.  Also may been from years of smoking.  She does not want any more chemotherapy.  I totally understand this.  She has disease that really cannot be cured although could be treated successfully with radiation and chemotherapy.  However, with this interstitial lung disease, radiation therapy is no longer an option.  Again we really have to keep in mind Molly Welch quality of life.  We will see about getting Molly Welch on Hospice.  I think hospice would be a fantastic idea for Molly Welch.  It would really help out Molly Welch daughter.  She is on oxygen.  She probably will be on oxygen long-term.  She has a little bit of a cough.  It is not productive.  She has had no diarrhea.  She has had some loose stools.  There is been no bleeding.  She has had some pain issues.  She really would like to avoid pain medication.  I told Molly Welch we can always try a Duragesic patch at a very low dose to help with pain.  I am sure that Hospice will be able to address this.  I did not discuss CODE STATUS with Molly Welch.  However, I know that she would not want to be kept alive on machines.  Molly Welch faith is incredibly strong.  She tells me that when God calls Molly Welch home, she will be ready.  I think it would not be a bad idea to get another PET scan on Molly Welch to see how everything looks with respect to Molly Welch malignancy.  I will set this  up.  Overall, I would say performance status is probably ECOG 3.       Medications:  Current Outpatient Medications:    acetaminophen (TYLENOL) 500 MG tablet, Take 500 mg by mouth daily as needed for headache (pain)., Disp: , Rfl:    albuterol (VENTOLIN HFA) 108 (90 Base) MCG/ACT inhaler, Inhale 1-2 puffs into the lungs every 6 (six) hours as needed., Disp: 8 g, Rfl: 2   ALPRAZolam (XANAX) 1 MG tablet, Take 0.5-1 mg by mouth See admin instructions. Take one tablet (1 mg) by mouth at 5:30am, take 1/2 tablet (0.5 mg) at 7:30am, 3pm and at bedtime., Disp: , Rfl:    AMBULATORY NON FORMULARY MEDICATION, Lift Chair Diagnosis, Disp: 1 Device, Rfl: 0   AMBULATORY NON FORMULARY MEDICATION, Rollator Diagnosis G 62.89, Disp: 1 Device, Rfl: 0   Ascorbic Acid (VITAMIN C) 1000 MG tablet, Take 1,000 mg by mouth every morning., Disp: , Rfl:    azelastine (OPTIVAR) 0.05 % ophthalmic solution, Place 1 drop into both eyes 2 (two) times daily as needed (dry eyes/itching)., Disp: , Rfl:    diclofenac Sodium (VOLTAREN) 1 % GEL, Apply 1 application topically at bedtime., Disp: , Rfl:    feeding supplement (ENSURE ENLIVE / ENSURE PLUS) LIQD, Take 237 mLs by mouth 3 (three) times daily between meals., Disp: , Rfl:  HYDROcodone-acetaminophen (NORCO/VICODIN) 5-325 MG tablet, Take 1 tablet by mouth every 4 (four) hours as needed for moderate pain., Disp: 60 tablet, Rfl: 0   hypromellose (SYSTANE OVERNIGHT THERAPY) 0.3 % GEL ophthalmic ointment, Place 1 application into both eyes at bedtime as needed for dry eyes., Disp: , Rfl:    lamoTRIgine (LAMICTAL) 100 MG tablet, Take 100 mg by mouth at bedtime., Disp: , Rfl:    lamoTRIgine (LAMICTAL) 25 MG tablet, Take 100 mg by mouth every morning., Disp: , Rfl:    lidocaine-prilocaine (EMLA) cream, Apply to affected area once, Disp: 30 g, Rfl: 3   lipase/protease/amylase (CREON) 36000 UNITS CPEP capsule, Take 36,000 Units by mouth See admin instructions. Take two capsules (72000  units) by mouth three times daily with meals and one capsule (36000 units) with snacks, Disp: , Rfl:    Multiple Vitamin (MULTIVITAMIN WITH MINERALS) TABS tablet, Take 1 tablet by mouth every morning. Centrum, Disp: , Rfl:    ondansetron (ZOFRAN) 8 MG tablet, Take 1 tablet (8 mg total) by mouth 2 (two) times daily as needed for refractory nausea / vomiting. Start on day 3 after carboplatin chemo., Disp: 30 tablet, Rfl: 1   Probiotic Product (PROBIOTIC PO), Take 1 tablet by mouth every morning., Disp: , Rfl:    prochlorperazine (COMPAZINE) 10 MG tablet, Take 1 tablet (10 mg total) by mouth every 6 (six) hours as needed (Nausea or vomiting)., Disp: 30 tablet, Rfl: 1   TURMERIC PO, Take 1 capsule by mouth daily., Disp: , Rfl:    valACYclovir (VALTREX) 500 MG tablet, Take 500 mg by mouth every morning., Disp: , Rfl:   Allergies:  Allergies  Allergen Reactions   Ativan [Lorazepam] Other (See Comments)   Doxycycline Swelling   Prednisone Other (See Comments)    Very emotional, crying, angry   Carbamates    Penicillin G Other (See Comments)   Sulfa Antibiotics Other (See Comments)   Penicillins Rash    Has patient had a PCN reaction causing immediate rash, facial/tongue/throat swelling, SOB or lightheadedness with hypotension: yes Has patient had a PCN reaction causing severe rash involving mucus membranes or skin necrosis: no Has patient had a PCN reaction that required hospitalization: no Has patient had a PCN reaction occurring within the last 10 years: no If all of the above answers are "NO", then may proceed with Cephalosporin use.    Sulfonamide Derivatives Rash    Past Medical History, Surgical history, Social history, and Family History were reviewed and updated.  Review of Systems: Review of Systems  Constitutional:  Positive for fatigue.  HENT:  Negative.    Eyes: Negative.   Respiratory: Negative.    Cardiovascular:  Positive for chest pain.  Gastrointestinal:  Positive for  nausea.  Endocrine: Negative.   Genitourinary: Negative.    Skin: Negative.   Neurological:  Positive for extremity weakness.  Hematological: Negative.   Psychiatric/Behavioral: Negative.     Physical Exam:  weight is 107 lb (48.5 kg). Molly Welch oral temperature is 97.7 F (36.5 C). Molly Welch blood pressure is 126/64 and Molly Welch pulse is 95. Molly Welch respiration is 20 and oxygen saturation is 99%.   Wt Readings from Last 3 Encounters:  07/29/21 107 lb (48.5 kg)  07/25/21 107 lb 6.4 oz (48.7 kg)  07/11/21 116 lb 3.2 oz (52.7 kg)    Physical Exam Vitals reviewed.  HENT:     Head: Normocephalic and atraumatic.  Eyes:     Pupils: Pupils are equal, round, and reactive to light.  Cardiovascular:     Rate and Rhythm: Normal rate and regular rhythm.     Heart sounds: Normal heart sounds.  Pulmonary:     Effort: Pulmonary effort is normal.     Breath sounds: Normal breath sounds.  Abdominal:     General: Bowel sounds are normal.     Palpations: Abdomen is soft.  Musculoskeletal:        General: No tenderness or deformity. Normal range of motion.     Cervical back: Normal range of motion.  Lymphadenopathy:     Cervical: No cervical adenopathy.  Skin:    General: Skin is warm and dry.     Findings: No erythema or rash.  Neurological:     Mental Status: She is alert and oriented to person, place, and time.  Psychiatric:        Behavior: Behavior normal.        Thought Content: Thought content normal.        Judgment: Judgment normal.     Lab Results  Component Value Date   WBC 8.1 07/29/2021   HGB 10.8 (L) 07/29/2021   HCT 34.3 (L) 07/29/2021   MCV 92.2 07/29/2021   PLT 280 07/29/2021     Chemistry      Component Value Date/Time   NA 137 07/29/2021 0821   NA 139 11/24/2019 1527   K 3.8 07/29/2021 0821   CL 96 (L) 07/29/2021 0821   CO2 36 (H) 07/29/2021 0821   BUN 11 07/29/2021 0821   BUN 13 11/24/2019 1527   CREATININE 0.68 07/29/2021 0821      Component Value Date/Time    CALCIUM 11.5 (H) 07/29/2021 0821   ALKPHOS 95 07/29/2021 0821   AST 23 07/29/2021 0821   ALT 12 07/29/2021 0821   BILITOT 0.3 07/29/2021 0821      Impression and Plan: Molly Welch is a very charming 76 year old white female.  I have known Molly Welch for many years.  She actually got me and my wife together..  She now has a locally advanced squamous cell carcinoma of the left lung.  By the staging studies that we have, looks like this is a stage IIIB tumor.  Again, we are going to withhold therapy now.  She does not want any treatment.  She is not a candidate for any radiation because of Molly Welch interstitial lung disease.  Again we will set Molly Welch up with a PET scan showing see how everything looks.  We will see about getting Hospice involved.  I do think this would be a reasonable option for Molly Welch.  She is hypercalcemic.  Some of this might be from dehydration.  However, we will give Molly Welch some IV fluids along with Zometa.  I would like to see Molly Welch back in about 3 weeks or so.  By then, we will I think have a much better idea as to how things are going to go for Molly Welch.   Volanda Napoleon, MD 1/23/20238:52 AM

## 2021-07-29 NOTE — Telephone Encounter (Signed)
Referral made to [facility] per dr Marin Olp request.

## 2021-07-29 NOTE — Patient Instructions (Signed)

## 2021-07-29 NOTE — Patient Instructions (Addendum)
Zoledronic Acid Injection (Hypercalcemia, Oncology) What is this medication? ZOLEDRONIC ACID (ZOE le dron ik AS id) slows calcium loss from bones. It high calcium levels in the blood from some kinds of cancer. It may be used in other people at risk for bone loss. This medicine may be used for other purposes; ask your health care provider or pharmacist if you have questions. COMMON BRAND NAME(S): Zometa What should I tell my care team before I take this medication? They need to know if you have any of these conditions: cancer dehydration dental disease kidney disease liver disease low levels of calcium in the blood lung or breathing disease (asthma) receiving steroids like dexamethasone or prednisone an unusual or allergic reaction to zoledronic acid, other medicines, foods, dyes, or preservatives pregnant or trying to get pregnant breast-feeding How should I use this medication? This drug is injected into a vein. It is given by a health care provider in a hospital or clinic setting. Talk to your health care provider about the use of this drug in children. Special care may be needed. Overdosage: If you think you have taken too much of this medicine contact a poison control center or emergency room at once. NOTE: This medicine is only for you. Do not share this medicine with others. What if I miss a dose? Keep appointments for follow-up doses. It is important not to miss your dose. Call your health care provider if you are unable to keep an appointment. What may interact with this medication? certain antibiotics given by injection NSAIDs, medicines for pain and inflammation, like ibuprofen or naproxen some diuretics like bumetanide, furosemide teriparatide thalidomide This list may not describe all possible interactions. Give your health care provider a list of all the medicines, herbs, non-prescription drugs, or dietary supplements you use. Also tell them if you smoke, drink alcohol, or  use illegal drugs. Some items may interact with your medicine. What should I watch for while using this medication? Visit your health care provider for regular checks on your progress. It may be some time before you see the benefit from this drug. Some people who take this drug have severe bone, joint, or muscle pain. This drug may also increase your risk for jaw problems or a broken thigh bone. Tell your health care provider right away if you have severe pain in your jaw, bones, joints, or muscles. Tell you health care provider if you have any pain that does not go away or that gets worse. Tell your dentist and dental surgeon that you are taking this drug. You should not have major dental surgery while on this drug. See your dentist to have a dental exam and fix any dental problems before starting this drug. Take good care of your teeth while on this drug. Make sure you see your dentist for regular follow-up appointments. You should make sure you get enough calcium and vitamin D while you are taking this drug. Discuss the foods you eat and the vitamins you take with your health care provider. Check with your health care provider if you have severe diarrhea, nausea, and vomiting, or if you sweat a lot. The loss of too much body fluid may make it dangerous for you to take this drug. You may need blood work done while you are taking this drug. Do not become pregnant while taking this drug. Women should inform their health care provider if they wish to become pregnant or think they might be pregnant. There is potential for serious  harm to an unborn child. Talk to your health care provider for more information. What side effects may I notice from receiving this medication? Side effects that you should report to your doctor or health care provider as soon as possible: allergic reactions (skin rash, itching or hives; swelling of the face, lips, or tongue) bone pain infection (fever, chills, cough, sore  throat, pain or trouble passing urine) jaw pain, especially after dental work joint pain kidney injury (trouble passing urine or change in the amount of urine) low blood pressure (dizziness; feeling faint or lightheaded, falls; unusually weak or tired) low calcium levels (fast heartbeat; muscle cramps or pain; pain, tingling, or numbness in the hands or feet; seizures) low magnesium levels (fast, irregular heartbeat; muscle cramp or pain; muscle weakness; tremors; seizures) low red blood cell counts (trouble breathing; feeling faint; lightheaded, falls; unusually weak or tired) muscle pain redness, blistering, peeling, or loosening of the skin, including inside the mouth severe diarrhea swelling of the ankles, feet, hands trouble breathing Side effects that usually do not require medical attention (report to your doctor or health care provider if they continue or are bothersome): anxious constipation coughing depressed mood eye irritation, itching, or pain fever general ill feeling or flu-like symptoms nausea pain, redness, or irritation at site where injected trouble sleeping This list may not describe all possible side effects. Call your doctor for medical advice about side effects. You may report side effects to FDA at 1-800-FDA-1088. Where should I keep my medication? This drug is given in a hospital or clinic. It will not be stored at home. NOTE: This sheet is a summary. It may not cover all possible information. If you have questions about this medicine, talk to your doctor, pharmacist, or health care provider.  2022 Elsevier/Gold Standard (2021-03-12 00:00:00) Dehydration, Adult Dehydration is a condition in which there is not enough water or other fluids in the body. This happens when a person loses more fluids than he or she takes in. Important organs, such as the kidneys, brain, and heart, cannot function without a proper amount of fluids. Any loss of fluids from the body can  lead to dehydration. Dehydration can be mild, moderate, or severe. It should be treated right away to prevent it from becoming severe. What are the causes? Dehydration may be caused by: Conditions that cause loss of water or other fluids, such as diarrhea, vomiting, or sweating or urinating a lot. Not drinking enough fluids, especially when you are ill or doing activities that require a lot of energy. Other illnesses and conditions, such as fever or infection. Certain medicines, such as medicines that remove excess fluid from the body (diuretics). Lack of safe drinking water. Not being able to get enough water and food. What increases the risk? The following factors may make you more likely to develop this condition: Having a long-term (chronic) illness that has not been treated properly, such as diabetes, heart disease, or kidney disease. Being 24 years of age or older. Having a disability. Living in a place that is high in altitude, where thinner, drier air causes more fluid loss. Doing exercises that put stress on your body for a long time (endurance sports). What are the signs or symptoms? Symptoms of dehydration depend on how severe it is. Mild or moderate dehydration Thirst. Dry lips or dry mouth. Dizziness or light-headedness, especially when standing up from a seated position. Muscle cramps. Dark urine. Urine may be the color of tea. Less urine or tears produced  than usual. Headache. Severe dehydration Changes in skin. Your skin may be cold and clammy, blotchy, or pale. Your skin also may not return to normal after being lightly pinched and released. Little or no tears, urine, or sweat. Changes in vital signs, such as rapid breathing and low blood pressure. Your pulse may be weak or may be faster than 100 beats a minute when you are sitting still. Other changes, such as: Feeling very thirsty. Sunken eyes. Cold hands and feet. Confusion. Being very tired (lethargic) or  having trouble waking from sleep. Short-term weight loss. Loss of consciousness. How is this diagnosed? This condition is diagnosed based on your symptoms and a physical exam. You may have blood and urine tests to help confirm the diagnosis. How is this treated? Treatment for this condition depends on how severe it is. Treatment should be started right away. Do not wait until dehydration becomes severe. Severe dehydration is an emergency and needs to be treated in a hospital. Mild or moderate dehydration can be treated at home. You may be asked to: Drink more fluids. Drink an oral rehydration solution (ORS). This drink helps restore proper amounts of fluids and salts and minerals in the blood (electrolytes). Severe dehydration can be treated: With IV fluids. By correcting abnormal levels of electrolytes. This is often done by giving electrolytes through a tube that is passed through your nose and into your stomach (nasogastric tube, or NG tube). By treating the underlying cause of dehydration. Follow these instructions at home: Oral rehydration solution If told by your health care provider, drink an ORS: Make an ORS by following instructions on the package. Start by drinking small amounts, about  cup (120 mL) every 5-10 minutes. Slowly increase how much you drink until you have taken the amount recommended by your health care provider. Eating and drinking     Drink enough clear fluid to keep your urine pale yellow. If you were told to drink an ORS, finish the ORS first and then start slowly drinking other clear fluids. Drink fluids such as: Water. Do not drink only water. Doing that can lead to hyponatremia, which is having too little salt (sodium) in the body. Water from ice chips you suck on. Fruit juice that you have added water to (diluted fruit juice). Low-calorie sports drinks. Eat foods that contain a healthy balance of electrolytes, such as bananas, oranges, potatoes, tomatoes,  and spinach. Do not drink alcohol. Avoid the following: Drinks that contain a lot of sugar. These include high-calorie sports drinks, fruit juice that is not diluted, and soda. Caffeine. Foods that are greasy or contain a lot of fat or sugar. General instructions Take over-the-counter and prescription medicines only as told by your health care provider. Do not take sodium tablets. Doing that can lead to having too much sodium in the body (hypernatremia). Return to your normal activities as told by your health care provider. Ask your health care provider what activities are safe for you. Keep all follow-up visits as told by your health care provider. This is important. Contact a health care provider if: You have muscle cramps, pain, or discomfort, such as: Pain in your abdomen and the pain gets worse or stays in one area (localizes). Stiff neck. You have a rash. You are more irritable than usual. You are sleepier or have a harder time waking than usual. You feel weak or dizzy. You feel very thirsty. Get help right away if you have: Any symptoms of severe dehydration. Symptoms of  vomiting, such as: You cannot eat or drink without vomiting. Vomiting gets worse or does not go away. Vomit includes blood or green matter (bile). Symptoms that get worse with treatment. A fever. A severe headache. Problems with urination or bowel movements, such as: Diarrhea that gets worse or does not go away. Blood in your stool (feces). This may cause stool to look black and tarry. Not urinating, or urinating only a small amount of very dark urine, within 6-8 hours. Trouble breathing. These symptoms may represent a serious problem that is an emergency. Do not wait to see if the symptoms will go away. Get medical help right away. Call your local emergency services (911 in the [place] Do not drive yourself to the hospital. Summary Dehydration is a condition in which there is not enough water or other  fluids in the body. This happens when a person loses more fluids than he or she takes in. Treatment for this condition depends on how severe it is. Treatment should be started right away. Do not wait until dehydration becomes severe. Drink enough clear fluid to keep your urine pale yellow. If you were told to drink an oral rehydration solution (ORS), finish the ORS first and then start slowly drinking other clear fluids. Take over-the-counter and prescription medicines only as told by your health care provider. Get help right away if you have any symptoms of severe dehydration. This information is not intended to replace advice given to you by your health care provider. Make sure you discuss any questions you have with your health care provider. Document Revised: 02/03/2019 Document Reviewed: 02/03/2019 Elsevier Patient Education  [place].

## 2021-07-30 ENCOUNTER — Encounter: Payer: Self-pay | Admitting: *Deleted

## 2021-07-30 DIAGNOSIS — J84112 Idiopathic pulmonary fibrosis: Secondary | ICD-10-CM | POA: Diagnosis not present

## 2021-07-30 DIAGNOSIS — M810 Age-related osteoporosis without current pathological fracture: Secondary | ICD-10-CM | POA: Diagnosis not present

## 2021-07-30 DIAGNOSIS — F32A Depression, unspecified: Secondary | ICD-10-CM | POA: Diagnosis not present

## 2021-07-30 DIAGNOSIS — Z87891 Personal history of nicotine dependence: Secondary | ICD-10-CM | POA: Diagnosis not present

## 2021-07-30 DIAGNOSIS — D630 Anemia in neoplastic disease: Secondary | ICD-10-CM | POA: Diagnosis not present

## 2021-07-30 DIAGNOSIS — J9601 Acute respiratory failure with hypoxia: Secondary | ICD-10-CM | POA: Diagnosis not present

## 2021-07-30 DIAGNOSIS — Z9981 Dependence on supplemental oxygen: Secondary | ICD-10-CM | POA: Diagnosis not present

## 2021-07-30 DIAGNOSIS — E440 Moderate protein-calorie malnutrition: Secondary | ICD-10-CM | POA: Diagnosis not present

## 2021-07-30 DIAGNOSIS — Z9181 History of falling: Secondary | ICD-10-CM | POA: Diagnosis not present

## 2021-07-30 DIAGNOSIS — C3432 Malignant neoplasm of lower lobe, left bronchus or lung: Secondary | ICD-10-CM | POA: Diagnosis not present

## 2021-07-30 DIAGNOSIS — J432 Centrilobular emphysema: Secondary | ICD-10-CM | POA: Diagnosis not present

## 2021-07-30 DIAGNOSIS — M47814 Spondylosis without myelopathy or radiculopathy, thoracic region: Secondary | ICD-10-CM | POA: Diagnosis not present

## 2021-07-30 LAB — T4: T4, Total: 6.9 ug/dL (ref 4.5–12.0)

## 2021-07-30 NOTE — Telephone Encounter (Signed)
See pt email from 1/3. Pts daughter appreciative of changes made.

## 2021-07-30 NOTE — Progress Notes (Signed)
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 She has been referred to Hospice.   Oncology Nurse Navigator Documentation  Oncology Nurse Navigator Flowsheets 07/30/2021  Abnormal Finding Date -  Confirmed Diagnosis Date -  Planned Course of Treatment -  Phase of Treatment -  Chemotherapy Actual Start Date: -  Navigator Follow Up Date: -  Navigator Follow Up Reason: -  Navigation Complete Date: 07/30/2021  Post Navigation: Continue to Follow Patient? No  Reason Not Navigating Patient: Hospice/Death  Navigator Location [facility]  Referral Date to RadOnc/MedOnc -  Navigator Encounter Type Appt/Treatment Plan Review  Telephone -  Treatment Initiated Date -  Patient Visit Type MedOnc  Treatment Phase Other  Barriers/Navigation Needs Coordination of Care;Education  Education -  Interventions None Required  Acuity Level 2-Minimal Needs (1-2 Barriers Identified)  Referrals -  Coordination of Care -  Education Method -  Support Groups/Services Friends and Family  Time Spent with Patient 15

## 2021-07-31 ENCOUNTER — Other Ambulatory Visit: Payer: Self-pay

## 2021-07-31 ENCOUNTER — Inpatient Hospital Stay: Payer: Medicare Other

## 2021-07-31 ENCOUNTER — Telehealth: Payer: Self-pay

## 2021-07-31 ENCOUNTER — Encounter (HOSPITAL_COMMUNITY): Payer: Self-pay | Admitting: Emergency Medicine

## 2021-07-31 DIAGNOSIS — Z9981 Dependence on supplemental oxygen: Secondary | ICD-10-CM | POA: Diagnosis not present

## 2021-07-31 DIAGNOSIS — J9601 Acute respiratory failure with hypoxia: Secondary | ICD-10-CM | POA: Diagnosis not present

## 2021-07-31 DIAGNOSIS — Z87891 Personal history of nicotine dependence: Secondary | ICD-10-CM | POA: Diagnosis not present

## 2021-07-31 DIAGNOSIS — E440 Moderate protein-calorie malnutrition: Secondary | ICD-10-CM | POA: Diagnosis not present

## 2021-07-31 DIAGNOSIS — Z9181 History of falling: Secondary | ICD-10-CM | POA: Diagnosis not present

## 2021-07-31 DIAGNOSIS — J84112 Idiopathic pulmonary fibrosis: Secondary | ICD-10-CM | POA: Diagnosis not present

## 2021-07-31 DIAGNOSIS — D630 Anemia in neoplastic disease: Secondary | ICD-10-CM | POA: Diagnosis not present

## 2021-07-31 DIAGNOSIS — J432 Centrilobular emphysema: Secondary | ICD-10-CM | POA: Diagnosis not present

## 2021-07-31 DIAGNOSIS — C3431 Malignant neoplasm of lower lobe, right bronchus or lung: Secondary | ICD-10-CM

## 2021-07-31 DIAGNOSIS — M810 Age-related osteoporosis without current pathological fracture: Secondary | ICD-10-CM | POA: Diagnosis not present

## 2021-07-31 DIAGNOSIS — M47814 Spondylosis without myelopathy or radiculopathy, thoracic region: Secondary | ICD-10-CM | POA: Diagnosis not present

## 2021-07-31 DIAGNOSIS — C3432 Malignant neoplasm of lower lobe, left bronchus or lung: Secondary | ICD-10-CM | POA: Diagnosis not present

## 2021-07-31 DIAGNOSIS — F32A Depression, unspecified: Secondary | ICD-10-CM | POA: Diagnosis not present

## 2021-07-31 MED ORDER — HYDROCODONE-ACETAMINOPHEN 5-325 MG PO TABS: 1.0000 | ORAL_TABLET | ORAL | 0 refills | Status: DC | PRN

## 2021-07-31 MED ORDER — DRONABINOL 2.5 MG PO CAPS: 2.5000 mg | ORAL_CAPSULE | Freq: Two times a day (BID) | ORAL | 0 refills | Status: DC

## 2021-07-31 NOTE — Progress Notes (Signed)
PA started for Dronabinol 2.5 mg through [employer] [facility], awaiting approval.    Approved through 01/28/2022. Reference # [id number].

## 2021-07-31 NOTE — Telephone Encounter (Signed)
Received call from Tolani Lake with [facility] stating that she is just leaving pt's home. Based on admission assessment, Andee Poles believes pt and family's goals align more with palliative care than hospice at this time. Pt wishes to continue coming into the office, have PET scan, continue OT/PT with [facility].  Andee Poles states family wishes to have PET scan & Marinol ordered as discussed at last office visit, along with refill on pain meds.   OK per Dr Marin Olp to change admission to palliative care. Refills ordered. dph

## 2021-08-01 DIAGNOSIS — E440 Moderate protein-calorie malnutrition: Secondary | ICD-10-CM | POA: Diagnosis not present

## 2021-08-01 DIAGNOSIS — Z9181 History of falling: Secondary | ICD-10-CM | POA: Diagnosis not present

## 2021-08-01 DIAGNOSIS — D630 Anemia in neoplastic disease: Secondary | ICD-10-CM | POA: Diagnosis not present

## 2021-08-01 DIAGNOSIS — J432 Centrilobular emphysema: Secondary | ICD-10-CM | POA: Diagnosis not present

## 2021-08-01 DIAGNOSIS — C3432 Malignant neoplasm of lower lobe, left bronchus or lung: Secondary | ICD-10-CM | POA: Diagnosis not present

## 2021-08-01 DIAGNOSIS — J9601 Acute respiratory failure with hypoxia: Secondary | ICD-10-CM | POA: Diagnosis not present

## 2021-08-01 DIAGNOSIS — M810 Age-related osteoporosis without current pathological fracture: Secondary | ICD-10-CM | POA: Diagnosis not present

## 2021-08-01 DIAGNOSIS — M47814 Spondylosis without myelopathy or radiculopathy, thoracic region: Secondary | ICD-10-CM | POA: Diagnosis not present

## 2021-08-01 DIAGNOSIS — F32A Depression, unspecified: Secondary | ICD-10-CM | POA: Diagnosis not present

## 2021-08-01 DIAGNOSIS — Z9981 Dependence on supplemental oxygen: Secondary | ICD-10-CM | POA: Diagnosis not present

## 2021-08-01 DIAGNOSIS — Z87891 Personal history of nicotine dependence: Secondary | ICD-10-CM | POA: Diagnosis not present

## 2021-08-01 DIAGNOSIS — J84112 Idiopathic pulmonary fibrosis: Secondary | ICD-10-CM | POA: Diagnosis not present

## 2021-08-02 DIAGNOSIS — J84112 Idiopathic pulmonary fibrosis: Secondary | ICD-10-CM | POA: Diagnosis not present

## 2021-08-02 DIAGNOSIS — C3432 Malignant neoplasm of lower lobe, left bronchus or lung: Secondary | ICD-10-CM | POA: Diagnosis not present

## 2021-08-02 DIAGNOSIS — D630 Anemia in neoplastic disease: Secondary | ICD-10-CM | POA: Diagnosis not present

## 2021-08-02 DIAGNOSIS — J432 Centrilobular emphysema: Secondary | ICD-10-CM | POA: Diagnosis not present

## 2021-08-02 DIAGNOSIS — Z87891 Personal history of nicotine dependence: Secondary | ICD-10-CM | POA: Diagnosis not present

## 2021-08-02 DIAGNOSIS — M47814 Spondylosis without myelopathy or radiculopathy, thoracic region: Secondary | ICD-10-CM | POA: Diagnosis not present

## 2021-08-02 DIAGNOSIS — E440 Moderate protein-calorie malnutrition: Secondary | ICD-10-CM | POA: Diagnosis not present

## 2021-08-02 DIAGNOSIS — J9601 Acute respiratory failure with hypoxia: Secondary | ICD-10-CM | POA: Diagnosis not present

## 2021-08-02 DIAGNOSIS — F32A Depression, unspecified: Secondary | ICD-10-CM | POA: Diagnosis not present

## 2021-08-02 DIAGNOSIS — Z9981 Dependence on supplemental oxygen: Secondary | ICD-10-CM | POA: Diagnosis not present

## 2021-08-02 DIAGNOSIS — M810 Age-related osteoporosis without current pathological fracture: Secondary | ICD-10-CM | POA: Diagnosis not present

## 2021-08-02 DIAGNOSIS — Z9181 History of falling: Secondary | ICD-10-CM | POA: Diagnosis not present

## 2021-08-06 ENCOUNTER — Encounter: Payer: Self-pay | Admitting: *Deleted

## 2021-08-06 ENCOUNTER — Telehealth: Payer: Self-pay

## 2021-08-06 DIAGNOSIS — Z9181 History of falling: Secondary | ICD-10-CM | POA: Diagnosis not present

## 2021-08-06 DIAGNOSIS — F32A Depression, unspecified: Secondary | ICD-10-CM | POA: Diagnosis not present

## 2021-08-06 DIAGNOSIS — E440 Moderate protein-calorie malnutrition: Secondary | ICD-10-CM | POA: Diagnosis not present

## 2021-08-06 DIAGNOSIS — M810 Age-related osteoporosis without current pathological fracture: Secondary | ICD-10-CM | POA: Diagnosis not present

## 2021-08-06 DIAGNOSIS — M47814 Spondylosis without myelopathy or radiculopathy, thoracic region: Secondary | ICD-10-CM | POA: Diagnosis not present

## 2021-08-06 DIAGNOSIS — C3432 Malignant neoplasm of lower lobe, left bronchus or lung: Secondary | ICD-10-CM | POA: Diagnosis not present

## 2021-08-06 DIAGNOSIS — J9601 Acute respiratory failure with hypoxia: Secondary | ICD-10-CM | POA: Diagnosis not present

## 2021-08-06 DIAGNOSIS — J84112 Idiopathic pulmonary fibrosis: Secondary | ICD-10-CM | POA: Diagnosis not present

## 2021-08-06 DIAGNOSIS — D630 Anemia in neoplastic disease: Secondary | ICD-10-CM | POA: Diagnosis not present

## 2021-08-06 DIAGNOSIS — Z87891 Personal history of nicotine dependence: Secondary | ICD-10-CM | POA: Diagnosis not present

## 2021-08-06 DIAGNOSIS — Z9981 Dependence on supplemental oxygen: Secondary | ICD-10-CM | POA: Diagnosis not present

## 2021-08-06 DIAGNOSIS — J432 Centrilobular emphysema: Secondary | ICD-10-CM | POA: Diagnosis not present

## 2021-08-06 NOTE — Telephone Encounter (Signed)
Attempted to contact patient to schedule a Palliative Care consult appointment. No answer left a message to return call.  

## 2021-08-11 DIAGNOSIS — J9601 Acute respiratory failure with hypoxia: Secondary | ICD-10-CM | POA: Diagnosis not present

## 2021-08-12 ENCOUNTER — Telehealth: Payer: Self-pay

## 2021-08-12 DIAGNOSIS — R64 Cachexia: Secondary | ICD-10-CM | POA: Diagnosis not present

## 2021-08-12 DIAGNOSIS — C3430 Malignant neoplasm of lower lobe, unspecified bronchus or lung: Secondary | ICD-10-CM | POA: Diagnosis not present

## 2021-08-12 DIAGNOSIS — J439 Emphysema, unspecified: Secondary | ICD-10-CM | POA: Diagnosis not present

## 2021-08-12 DIAGNOSIS — Z0000 Encounter for general adult medical examination without abnormal findings: Secondary | ICD-10-CM | POA: Diagnosis not present

## 2021-08-12 NOTE — Telephone Encounter (Signed)
Spoke with patient's daughter Marliss Czar and scheduled a Mychart Palliative Consult for 08/19/21 @ 12:30PM.   Consent obtained; updated Outlook/Netsmart/Team List and Epic.

## 2021-08-13 ENCOUNTER — Other Ambulatory Visit: Payer: Self-pay

## 2021-08-13 ENCOUNTER — Ambulatory Visit (HOSPITAL_COMMUNITY)
Admission: RE | Admit: 2021-08-13 | Discharge: 2021-08-13 | Disposition: A | Discharge status: Home / Self Care | Payer: Medicare Other | Source: Ambulatory Visit | Attending: Hematology & Oncology | Admitting: Hematology & Oncology

## 2021-08-13 DIAGNOSIS — C3431 Malignant neoplasm of lower lobe, right bronchus or lung: Secondary | ICD-10-CM | POA: Diagnosis not present

## 2021-08-13 DIAGNOSIS — C3490 Malignant neoplasm of unspecified part of unspecified bronchus or lung: Secondary | ICD-10-CM | POA: Diagnosis not present

## 2021-08-13 DIAGNOSIS — I2510 Atherosclerotic heart disease of native coronary artery without angina pectoris: Secondary | ICD-10-CM | POA: Diagnosis not present

## 2021-08-13 DIAGNOSIS — K8020 Calculus of gallbladder without cholecystitis without obstruction: Secondary | ICD-10-CM | POA: Diagnosis not present

## 2021-08-13 DIAGNOSIS — J8489 Other specified interstitial pulmonary diseases: Secondary | ICD-10-CM | POA: Diagnosis not present

## 2021-08-13 LAB — GLUCOSE, CAPILLARY: Glucose-Capillary: 100 mg/dL — ABNORMAL HIGH (ref 70–99)

## 2021-08-13 MED ORDER — FLUDEOXYGLUCOSE F - 18 (FDG) INJECTION
5.3000 | Freq: Once | INTRAVENOUS | Status: AC
  Administered 2021-08-13: 5.3 via INTRAVENOUS

## 2021-08-14 ENCOUNTER — Telehealth: Payer: Self-pay

## 2021-08-14 DIAGNOSIS — Z87891 Personal history of nicotine dependence: Secondary | ICD-10-CM | POA: Diagnosis not present

## 2021-08-14 DIAGNOSIS — D630 Anemia in neoplastic disease: Secondary | ICD-10-CM | POA: Diagnosis not present

## 2021-08-14 DIAGNOSIS — J9601 Acute respiratory failure with hypoxia: Secondary | ICD-10-CM | POA: Diagnosis not present

## 2021-08-14 DIAGNOSIS — C3432 Malignant neoplasm of lower lobe, left bronchus or lung: Secondary | ICD-10-CM | POA: Diagnosis not present

## 2021-08-14 DIAGNOSIS — M810 Age-related osteoporosis without current pathological fracture: Secondary | ICD-10-CM | POA: Diagnosis not present

## 2021-08-14 DIAGNOSIS — E440 Moderate protein-calorie malnutrition: Secondary | ICD-10-CM | POA: Diagnosis not present

## 2021-08-14 DIAGNOSIS — J432 Centrilobular emphysema: Secondary | ICD-10-CM | POA: Diagnosis not present

## 2021-08-14 DIAGNOSIS — M47814 Spondylosis without myelopathy or radiculopathy, thoracic region: Secondary | ICD-10-CM | POA: Diagnosis not present

## 2021-08-14 DIAGNOSIS — Z9981 Dependence on supplemental oxygen: Secondary | ICD-10-CM | POA: Diagnosis not present

## 2021-08-14 DIAGNOSIS — F32A Depression, unspecified: Secondary | ICD-10-CM | POA: Diagnosis not present

## 2021-08-14 DIAGNOSIS — Z9181 History of falling: Secondary | ICD-10-CM | POA: Diagnosis not present

## 2021-08-14 DIAGNOSIS — J84112 Idiopathic pulmonary fibrosis: Secondary | ICD-10-CM | POA: Diagnosis not present

## 2021-08-14 NOTE — Telephone Encounter (Signed)
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  There is nothing new.  This is all great news.  God is awesome!!!  pete

## 2021-08-14 NOTE — Telephone Encounter (Signed)
Called and LM informing patient of results, requested a CB with any questions or concerns.

## 2021-08-19 ENCOUNTER — Other Ambulatory Visit: Payer: Self-pay

## 2021-08-19 ENCOUNTER — Encounter: Payer: Self-pay | Admitting: Internal Medicine

## 2021-08-19 ENCOUNTER — Telehealth: Payer: Medicare Other | Admitting: Internal Medicine

## 2021-08-19 DIAGNOSIS — R29898 Other symptoms and signs involving the musculoskeletal system: Secondary | ICD-10-CM | POA: Diagnosis not present

## 2021-08-19 DIAGNOSIS — C3432 Malignant neoplasm of lower lobe, left bronchus or lung: Secondary | ICD-10-CM | POA: Diagnosis not present

## 2021-08-19 DIAGNOSIS — J9611 Chronic respiratory failure with hypoxia: Secondary | ICD-10-CM

## 2021-08-19 DIAGNOSIS — Z515 Encounter for palliative care: Secondary | ICD-10-CM

## 2021-08-19 DIAGNOSIS — E440 Moderate protein-calorie malnutrition: Secondary | ICD-10-CM | POA: Diagnosis not present

## 2021-08-19 DIAGNOSIS — J849 Interstitial pulmonary disease, unspecified: Secondary | ICD-10-CM | POA: Diagnosis not present

## 2021-08-19 NOTE — Progress Notes (Signed)
[facility] Consult Note Telephone: [phone]  Fax: [phone]   Date of encounter: 08/19/21 8:07 AM PATIENT NAME: Molly Welch [address] [place] [address]   [phone] (home)  DOB: 1945-10-17 MRN: [id number] PRIMARY CARE PROVIDER:    Carolee Rota, [place],  [place] [address] [phone]  REFERRING PROVIDER:   Carolee Rota, NP [address]  [place] [address] [phone]  RESPONSIBLE PARTY:    Contact Information     Name Relation Home Work Mobile   Welch,Molly Daughter [phone]  [phone]        Due to the COVID-19 crisis, this visit was done via telemedicine from my office and it was initiated and consent by this patient and or family at home.    I connected with  Molly Welch OR PROXY on 08/19/21 by a video enabled telemedicine application and verified that I am speaking with the correct person using two identifiers.   I discussed the limitations of evaluation and management by telemedicine. The patient expressed understanding and agreed to proceed.   Palliative Care was asked to follow this patient by consultation request of  Carolee Rota, NP to address advance care planning and complex medical decision making. This is the initial visit.                                     ASSESSMENT AND PLAN / RECOMMENDATIONS:   Advance Care Planning/Goals of Care: Goals include to maximize quality of life and symptom management. Patient/health care surrogate gave his/her permission to discuss.Our advance care planning conversation included a discussion about:    The value and importance of advance care planning  Experiences with loved ones who have been seriously ill or have died  Exploration of personal, cultural or spiritual beliefs that might influence medical decisions  Exploration of goals of care in the event of a sudden injury or illness  Identification  of a healthcare  agent--has living will and HCPOA Review and updating or creation of an  advance directive document . Decision not to resuscitate or to de-escalate disease focused treatments due to poor prognosis. Requests DNR form completion.  I can do and mail, but she's sees Dr. Marin Olp Friday so likely it will get done faster if he does it while she is at the appt.  She also requests no further hospitalizations and no feeding tubes.  We will further discuss IVFs and antibiotics when we complete the MOST form.  I am emailing a sample form to her daughter, Molly Welch.   Pt wants to stay at home and Molly Welch has moved in to take care of her.  Their focus is her quality of life.  She wants to get stronger, walk without a walker and be more independent at home, gain weight back.    Symptom Management/Plan: 1. Malignant neoplasm of lower lobe of left lung ([place]) -pt has opted to stop chemo/immunotherapy and is not eligible for XRT due to her ILD -she declined with weight loss, nausea, malaise -does have f/u with Dr. Marin Olp again Friday to address calcium levels she reports (was elevated, also has had hyponatremia and significant anemia) -focus now on quality  2. Malnutrition of moderate degree -last albumin was up to 3.9, intake some improved with eating 1/2 of meals on wheels at lunch and half at supper and her daughter is making her  breakfast with omelette or tomato sandwich with mayo-was able to eat the full sandwich but usually has only half, I see that prealbumin will be drawn Friday which will be curious along with weight  -continue 3 ensures per day, also  3. ILD (interstitial lung disease) ([place]) -cont 3L O2 for this, is able to get put on portable to sit outside which she enjoys  4. Chronic respiratory failure with hypoxia (HCC) -continue oxygen therapy  5. Weakness of both lower extremities -had been getting PT and await reauthorization at this point, made progress to get in and out of bed on own and  ambulate short distances with walker--further with daughter's company, wants to be free of walker  6. Palliative care by specialist -discussed goals at present which are strengthening, weight gain, enjoying time with daughter and outside -MOST introduced and will be done at f/u visit after reviewed by pt and daughter, Molly Welch but clear already on DNR< no hospitalization and no feeding tubes -fluids and abx TBD  Follow up Palliative Care Visit: Palliative care will continue to follow for complex medical decision making, advance care planning, and clarification of goals. Return 4 weeks or prn.   This visit was coded based on medical decision making (MDM).  21 mins spent on ACP.  PPS: 50%  HOSPICE ELIGIBILITY/DIAGNOSIS: Yes/lung cancer   Chief Complaint: new palliative care consult  HISTORY OF PRESENT ILLNESS:  [name] is a 76 y.o. year old female  with left lower lobe lung cancer s/p chemo from 04/26/21-07/29/21.  She was then hospitalized with pneumonia which took out of her the little bit she had left.  Appears cancer also involves mediastinal and hilar nodes, trace left pleural effusion and left parotic also lit up on PET.  She has underlying usual interstitial pneumonitis and emphysema per imaging.  When she saw Dr. Marin Olp on 1/23, she opted to stop her immunotherapy.  His notes indicate her disease is incurable and she is not eligible for radiation therapy due to her ILD.  She had shared with him that she was "ready when God calls her home".    Review of oncology notes and labs and hospital records as above and labs included albumin 3.9 on 1/23 up from 2.9 on 07/11/21.  BUN 11, cr 0.68 with gfr over 60, hgb 10.8 with nl wbc and plts.    When I met with pt and her daughter virtually today, pt is very clear about no more chemo and XRT not being possible.  She described feeling poorly and wanting to have a better quality of life and enjoy the time she has left.  She continues to get  nauseous each am but that does respond to zofran.  She is eating 1/2 of her meals on wheels at lunch and supper and about 1/2 of what Molly Welch cooks for breakfast plus 3 ensures--does get 32-40oz fluids daily.  Also having snacks.  She enjoys going to sit out outside and listening/watching the small waterfall there.  She has had some PT, OT, and they are waiting reauthorization to get more.  She'd like to be able to walk w/o her walker.  She is now able to get in and out of bed and up and down from a chair on her own with her walker now.  She previously required help with that when she left the hospital.  Molly Welch does still help with her bath and getting dressed.  She is with her 24x7.    History obtained  from review of EMR, discussion with primary team, and interview with family, facility staff/caregiver and/or Ms. Degracia.  I reviewed available labs, medications, imaging, studies and related documents from the EMR.  Records reviewed and summarized above.   ROS  General: NAD EYES: denies vision changes ENMT: denies dysphagia Cardiovascular: denies chest pain, denies DOE Pulmonary: has dry cough, denies increased SOB Abdomen: endorses 50% appetite, denies constipation, endorses continence of bowel GU: denies dysuria, endorses continence of urine MSK:  has increased weakness but better than when she came home from hospital,  no falls reported Skin: denies rashes or wounds--did have some on bottom but doing better with use of cushion and repositioning Neurological: denies pain, denies insomnia Psych: Endorses positive mood Heme/lymph/immuno: denies bruises, abnormal bleeding  Physical Exam: limited by virtual visit Current and past weights117 is latest Constitutional: NAD General: frail appearing, thin, pale  EYES: anicteric sclera, lids intact, no discharge  ENMT: intact hearing, oral mucous membranes moist Pulmonary:  no increased work of breathing, dry cough, on 3L via [place] Abdomen: intake  50%,  MSK: has sarcopenia, moves all extremities, ambulatory with walker Skin: warm and dry, no rashes or wounds on visible skin Neuro:  has generalized weakness,  no cognitive impairment Psych: non-anxious affect, A and O x 3 Hem/lymph/immuno: no widespread bruising  CURRENT PROBLEM LIST:  Patient Active Problem List   Diagnosis Date Noted   Chronic respiratory failure with hypoxia (HCC) 07/25/2021   IPF (idiopathic pulmonary fibrosis) (HCC)     Class: Chronic   ILD (interstitial lung disease) ([facility])    Malnutrition of moderate degree 07/04/2021   Septic shock ([facility]) 07/03/2021   Acute respiratory failure with hypoxemia ([place]) 07/03/2021   Lactic acidosis 07/03/2021   Hyponatremia 07/03/2021   Leukocytosis 07/03/2021   Anemia of chronic disease 07/03/2021   Abnormal chest x-ray 07/03/2021   Acute hypoxemic respiratory failure due to COVID-19 [facility]) 07/03/2021   Goals of care, counseling/discussion 04/19/2021   Lung cancer, lower lobe ([facility]) 04/19/2021   Mass of left lung    Recurrent falls 03/24/2021   CAP (community acquired pneumonia) 03/23/2021   Vertigo 03/23/2021   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   Tobacco abuse 03/23/2021   Debility 03/23/2021   Intractable nausea and vomiting 03/23/2021   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   Tremor of unknown origin 10/18/2020   Anxiety    CONTACT DERMATITIS&OTHER [facility]ZEMA DUE TO PLANTS 08/17/2010   PAST MEDICAL HISTORY:  Active Ambulatory Problems    Diagnosis Date Noted   CONTACT DERMATITIS&OTHER ECZEMA DUE TO PLANTS 08/17/2010   Anxiety    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   Tremor of unknown origin 10/18/2020   CAP (community acquired pneumonia) 03/23/2021   Vertigo 03/23/2021   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) 07/03/2021   Malnutrition of moderate degree 07/04/2021   ILD (interstitial lung disease) (HCC)    IPF (idiopathic pulmonary  fibrosis) ([place])    Chronic respiratory failure with hypoxia ([facility]) 07/25/2021   Resolved Ambulatory Problems    Diagnosis Date Noted   No Resolved Ambulatory Problems   Past Medical History:  Diagnosis Date   Anxiety and depression    Depression    Tobacco dependence    SOCIAL HX:  Social History   Tobacco Use   Smoking status: Former    Packs/day: 0.50    Years: 55.00    Pack years: 27.50    Types: Cigarettes    Quit date: 03/07/2021    Years since quitting: 0.4   Smokeless tobacco: Never  Substance Use Topics   Alcohol use: No   FAMILY HX:  Family History  Problem Relation Age of Onset   Suicidality Father    Heart disease Brother    Cirrhosis Brother    Depression Son    GI Disease Son    Arthritis Son    Diabetes Other       ALLERGIES:  Allergies  Allergen Reactions   Ativan [Lorazepam] Other (See Comments)   Doxycycline Swelling   Prednisone Other (See Comments)    Very emotional, crying, angry   Carbamates    Penicillin G Other (See Comments)   Sulfa Antibiotics Other (See Comments)   Penicillins Rash    Has patient had a PCN reaction causing immediate rash, facial/tongue/throat swelling, SOB or lightheadedness with hypotension: yes Has patient had a PCN reaction causing severe rash involving mucus membranes or skin necrosis: no Has patient had a PCN reaction that required  hospitalization: no Has patient had a PCN reaction occurring within the last 10 years: no If all of the above answers are "NO", then may proceed with Cephalosporin use.    Sulfonamide Derivatives Rash     PERTINENT MEDICATIONS:  Outpatient Encounter Medications as of 08/19/2021  Medication Sig   acetaminophen (TYLENOL) 500 MG tablet Take 500 mg by mouth daily as needed for headache (pain).   albuterol (VENTOLIN HFA) 108 (90 Base) MCG/ACT inhaler Inhale 1-2 puffs into the lungs every 6 (six) hours as needed.   ALPRAZolam (XANAX) 1 MG tablet Take 0.5-1 mg by mouth See admin instructions. Take one tablet (1 mg) by mouth at 5:30am, take 1/2 tablet (0.5 mg) at 7:30am, 3pm and at bedtime.   AMBULATORY NON FORMULARY MEDICATION Lift Chair Diagnosis   AMBULATORY NON FORMULARY MEDICATION Rollator Diagnosis G 62.89   Ascorbic Acid (VITAMIN C) 1000 MG tablet Take 1,000 mg by mouth every morning.   azelastine (OPTIVAR) 0.05 % ophthalmic solution Place 1 drop into both eyes 2 (two) times daily as needed (dry eyes/itching).   diclofenac Sodium (VOLTAREN) 1 % GEL Apply 1 application topically at bedtime.   dronabinol (MARINOL) 2.5 MG capsule Take 1 capsule (2.5 mg total) by mouth 2 (two) times daily before a meal.   feeding supplement (ENSURE ENLIVE / ENSURE PLUS) LIQD Take 237 mLs by mouth 3 (three) times daily between meals.   HYDROcodone-acetaminophen (NORCO/VICODIN) 5-325 MG tablet Take 1 tablet by mouth every 4 (four) hours as needed for moderate pain.   hypromellose (SYSTANE OVERNIGHT THERAPY) 0.3 % GEL ophthalmic ointment Place 1 application into both eyes at bedtime as needed for dry eyes.   lamoTRIgine (LAMICTAL) 100 MG tablet Take 100 mg by mouth at bedtime.   lamoTRIgine (LAMICTAL) 25 MG tablet Take 100 mg by mouth every morning.   lipase/protease/amylase (CREON) 36000 UNITS CPEP capsule Take 36,000 Units by mouth See admin instructions. Take two capsules (  72000 units) by mouth three times daily  with meals and one capsule (36000 units) with snacks   Multiple Vitamin (MULTIVITAMIN WITH MINERALS) TABS tablet Take 1 tablet by mouth every morning. Centrum   Probiotic Product (PROBIOTIC PO) Take 1 tablet by mouth every morning.   prochlorperazine (COMPAZINE) 10 MG tablet Take 1 tablet (10 mg total) by mouth every 6 (six) hours as needed (Nausea or vomiting).   TURMERIC PO Take 1 capsule by mouth daily.   valACYclovir (VALTREX) 500 MG tablet Take 500 mg by mouth every morning.   No facility-administered encounter medications on file as of 08/19/2021.   Thank you for the opportunity to participate in the care of Ms. Panebianco.  The palliative care team will continue to follow. Please call our office at [phone] if we can be of additional assistance.   Nysha Koplin, DO \  COVID-19 PATIENT SCREENING TOOL Asked and negative response unless otherwise noted:  Have you had symptoms of covid, tested positive or been in contact with someone with symptoms/positive test in the past 5-10 days? no

## 2021-08-21 DIAGNOSIS — J432 Centrilobular emphysema: Secondary | ICD-10-CM | POA: Diagnosis not present

## 2021-08-21 DIAGNOSIS — C3432 Malignant neoplasm of lower lobe, left bronchus or lung: Secondary | ICD-10-CM | POA: Diagnosis not present

## 2021-08-21 DIAGNOSIS — Z9981 Dependence on supplemental oxygen: Secondary | ICD-10-CM | POA: Diagnosis not present

## 2021-08-21 DIAGNOSIS — J84112 Idiopathic pulmonary fibrosis: Secondary | ICD-10-CM | POA: Diagnosis not present

## 2021-08-21 DIAGNOSIS — M810 Age-related osteoporosis without current pathological fracture: Secondary | ICD-10-CM | POA: Diagnosis not present

## 2021-08-21 DIAGNOSIS — J9601 Acute respiratory failure with hypoxia: Secondary | ICD-10-CM | POA: Diagnosis not present

## 2021-08-21 DIAGNOSIS — F32A Depression, unspecified: Secondary | ICD-10-CM | POA: Diagnosis not present

## 2021-08-21 DIAGNOSIS — E440 Moderate protein-calorie malnutrition: Secondary | ICD-10-CM | POA: Diagnosis not present

## 2021-08-21 DIAGNOSIS — M47814 Spondylosis without myelopathy or radiculopathy, thoracic region: Secondary | ICD-10-CM | POA: Diagnosis not present

## 2021-08-21 DIAGNOSIS — D630 Anemia in neoplastic disease: Secondary | ICD-10-CM | POA: Diagnosis not present

## 2021-08-21 DIAGNOSIS — Z9181 History of falling: Secondary | ICD-10-CM | POA: Diagnosis not present

## 2021-08-21 DIAGNOSIS — Z87891 Personal history of nicotine dependence: Secondary | ICD-10-CM | POA: Diagnosis not present

## 2021-08-23 ENCOUNTER — Inpatient Hospital Stay: Payer: Medicare Other

## 2021-08-23 ENCOUNTER — Encounter: Payer: Self-pay | Admitting: Hematology & Oncology

## 2021-08-23 ENCOUNTER — Inpatient Hospital Stay: Payer: Medicare Other | Attending: Hematology and Oncology

## 2021-08-23 ENCOUNTER — Inpatient Hospital Stay (HOSPITAL_BASED_OUTPATIENT_CLINIC_OR_DEPARTMENT_OTHER): Payer: Medicare Other | Admitting: Hematology & Oncology

## 2021-08-23 ENCOUNTER — Other Ambulatory Visit: Payer: Self-pay

## 2021-08-23 VITALS — BP 106/55 | HR 82 | Temp 97.7°F | Resp 17 | Wt 106.0 lb

## 2021-08-23 DIAGNOSIS — Z79899 Other long term (current) drug therapy: Secondary | ICD-10-CM | POA: Insufficient documentation

## 2021-08-23 DIAGNOSIS — Z95828 Presence of other vascular implants and grafts: Secondary | ICD-10-CM

## 2021-08-23 DIAGNOSIS — R197 Diarrhea, unspecified: Secondary | ICD-10-CM | POA: Insufficient documentation

## 2021-08-23 DIAGNOSIS — C3432 Malignant neoplasm of lower lobe, left bronchus or lung: Secondary | ICD-10-CM | POA: Insufficient documentation

## 2021-08-23 DIAGNOSIS — C3431 Malignant neoplasm of lower lobe, right bronchus or lung: Secondary | ICD-10-CM

## 2021-08-23 LAB — CMP (CANCER CENTER ONLY)
ALT: 9 U/L (ref 0–44)
AST: 18 U/L (ref 15–41)
Albumin: 3.8 g/dL (ref 3.5–5.0)
Alkaline Phosphatase: 79 U/L (ref 38–126)
Anion gap: 5 (ref 5–15)
BUN: 15 mg/dL (ref 8–23)
CO2: 37 mmol/L — ABNORMAL HIGH (ref 22–32)
Calcium: 9.7 mg/dL (ref 8.9–10.3)
Chloride: 96 mmol/L — ABNORMAL LOW (ref 98–111)
Creatinine: 0.66 mg/dL (ref 0.44–1.00)
GFR, Estimated: >60 mL/min (ref >60)
Glucose, Bld: 104 mg/dL — ABNORMAL HIGH (ref 70–99)
Potassium: 4 mmol/L (ref 3.5–5.1)
Sodium: 138 mmol/L (ref 135–145)
Total Bilirubin: 0.3 mg/dL (ref 0.3–1.2)
Total Protein: 7.7 g/dL (ref 6.5–8.1)

## 2021-08-23 LAB — CBC WITH DIFFERENTIAL (CANCER CENTER ONLY)
Abs Immature Granulocytes: 0.03 10*3/uL (ref 0.00–0.07)
Basophils Absolute: 0 10*3/uL (ref 0.0–0.1)
Basophils Relative: 1 %
Eosinophils Absolute: 0.1 10*3/uL (ref 0.0–0.5)
Eosinophils Relative: 2 %
HCT: 34.4 % — ABNORMAL LOW (ref 36.0–46.0)
Hemoglobin: 10.9 g/dL — ABNORMAL LOW (ref 12.0–15.0)
Immature Granulocytes: 0 %
Lymphocytes Relative: 24 %
Lymphs Abs: 1.9 10*3/uL (ref 0.7–4.0)
MCH: 29.6 pg (ref 26.0–34.0)
MCHC: 31.7 g/dL (ref 30.0–36.0)
MCV: 93.5 fL (ref 80.0–100.0)
Monocytes Absolute: 0.9 10*3/uL (ref 0.1–1.0)
Monocytes Relative: 11 %
Neutro Abs: 4.8 10*3/uL (ref 1.7–7.7)
Neutrophils Relative %: 62 %
Platelet Count: 337 10*3/uL (ref 150–400)
RBC: 3.68 MIL/uL — ABNORMAL LOW (ref 3.87–5.11)
RDW: 15.2 % (ref 11.5–15.5)
WBC Count: 7.7 10*3/uL (ref 4.0–10.5)
nRBC: 0 % (ref 0.0–0.2)

## 2021-08-23 LAB — PREALBUMIN: Prealbumin: 27 mg/dL (ref 18–38)

## 2021-08-23 LAB — LACTATE DEHYDROGENASE: LDH: 176 U/L (ref 98–192)

## 2021-08-23 MED ORDER — SODIUM CHLORIDE 0.9% FLUSH
10.0000 mL | INTRAVENOUS | Status: AC | PRN
  Administered 2021-08-23: 10 mL via INTRAVENOUS

## 2021-08-23 MED ORDER — HEPARIN SOD (PORK) LOCK FLUSH 100 UNIT/ML IV SOLN
500.0000 [IU] | Freq: Once | INTRAVENOUS | Status: AC
  Administered 2021-08-23: 500 [IU] via INTRAVENOUS

## 2021-08-23 NOTE — Progress Notes (Signed)
Hematology and Oncology Follow Up Visit  Molly Welch [id number] 03/30/1946 76 y.o. 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  Surprisingly, she is holding her own right now.  We did do a PET scan on her.  The PET scan thankfully did not show any evidence of tumor progression.  After, she had some decrease in the left lung mass.  She is being followed by palliative care.  We did talk about end-of-life issues.  She is a DO NOT RESUSCITATE.  I think her outcome will clearly be focused and be based about her nutritional status.  This is doing pretty well right now.  Her albumin is 3.8.  She is not having much in the way of pain.  She is having no increased shortness of breath.  She is on oxygen.  She has interstitial lung disease likely from smoking but also may be from immunotherapy that she took.  I am just happy that she is doing pretty well at home.  Happy that she has a decent quality of life.  Her daughter is doing an incredible job with her.    She is having little diarrhea.  I told her she can try some Imodium or some Pepto-Bismol.  I would have to say that right now, her performance status is probably ECOG 2-3.      Medications:  Current Outpatient Medications:    acetaminophen (TYLENOL) 500 MG tablet, Take 500 mg by mouth daily as needed for headache (pain)., Disp: , Rfl:    albuterol (VENTOLIN HFA) 108 (90 Base) MCG/ACT inhaler, Inhale 1-2 puffs into the lungs every 6 (six) hours as needed., Disp: 8 g, Rfl: 2   ALPRAZolam (XANAX) 1 MG tablet, Take 0.5-1 mg by mouth See admin instructions. Take one tablet (1 mg) by mouth at 5:30am, take 1/2 tablet (0.5 mg) at 7:30am, 3pm and at bedtime., Disp: , Rfl:    AMBULATORY NON  FORMULARY MEDICATION, Lift Chair Diagnosis, Disp: 1 Device, Rfl: 0   AMBULATORY NON FORMULARY MEDICATION, Rollator Diagnosis G 62.89, Disp: 1 Device, Rfl: 0   Ascorbic Acid (VITAMIN C) 1000 MG tablet, Take 1,000 mg by mouth every morning., Disp: , Rfl:    azelastine (OPTIVAR) 0.05 % ophthalmic solution, Place 1 drop into both eyes 2 (two) times daily as needed (dry eyes/itching)., Disp: , Rfl:    diclofenac Sodium (VOLTAREN) 1 % GEL, Apply 1 application topically at bedtime., Disp: , Rfl:    feeding supplement (ENSURE ENLIVE / ENSURE PLUS) LIQD, Take 237 mLs by mouth 3 (three) times daily between meals., Disp: , Rfl:    HYDROcodone-acetaminophen (NORCO/VICODIN) 5-325 MG tablet, Take 1 tablet by mouth every 4 (four) hours as needed for moderate pain., Disp: 60 tablet, Rfl: 0   hypromellose (SYSTANE OVERNIGHT THERAPY) 0.3 % GEL ophthalmic ointment, Place 1 application into both eyes at bedtime as needed for dry eyes., Disp: , Rfl:    lamoTRIgine (LAMICTAL) 100 MG tablet, Take 100 mg by mouth at bedtime., Disp: , Rfl:    lamoTRIgine (LAMICTAL) 25 MG tablet, Take 100 mg by mouth every morning., Disp: , Rfl:    lipase/protease/amylase (CREON) 36000 UNITS CPEP capsule, Take 36,000 Units by mouth See admin instructions. Take two capsules (72000 units) by mouth  three times daily with meals and one capsule (36000 units) with snacks, Disp: , Rfl:    Multiple Vitamin (MULTIVITAMIN WITH MINERALS) TABS tablet, Take 1 tablet by mouth every morning. Centrum, Disp: , Rfl:    Probiotic Product (PROBIOTIC PO), Take 1 tablet by mouth every morning., Disp: , Rfl:    prochlorperazine (COMPAZINE) 10 MG tablet, Take 1 tablet (10 mg total) by mouth every 6 (six) hours as needed (Nausea or vomiting)., Disp: 30 tablet, Rfl: 1   traMADol (ULTRAM) 50 MG tablet, Take 50 mg by mouth as needed., Disp: , Rfl:    TURMERIC PO, Take 1 capsule by mouth daily., Disp: , Rfl:    valACYclovir (VALTREX) 500 MG tablet, Take 500 mg by mouth  every morning., Disp: , Rfl:   Allergies:  Allergies  Allergen Reactions   Ativan [Lorazepam] Other (See Comments)   Doxycycline Swelling   Prednisone Other (See Comments)    Very emotional, crying, angry   Carbamates    Penicillin G Other (See Comments)   Sulfa Antibiotics Other (See Comments)   Penicillins Rash    Has patient had a PCN reaction causing immediate rash, facial/tongue/throat swelling, SOB or lightheadedness with hypotension: yes Has patient had a PCN reaction causing severe rash involving mucus membranes or skin necrosis: no Has patient had a PCN reaction that required hospitalization: no Has patient had a PCN reaction occurring within the last 10 years: no If all of the above answers are "NO", then may proceed with Cephalosporin use.    Sulfonamide Derivatives Rash    Past Medical History, Surgical history, Social history, and Family History were reviewed and updated.  Review of Systems: Review of Systems  Constitutional:  Positive for fatigue.  HENT:  Negative.    Eyes: Negative.   Respiratory: Negative.    Cardiovascular:  Positive for chest pain.  Gastrointestinal:  Positive for nausea.  Endocrine: Negative.   Genitourinary: Negative.    Skin: Negative.   Neurological:  Positive for extremity weakness.  Hematological: Negative.   Psychiatric/Behavioral: Negative.     Physical Exam:  weight is 106 lb (48.1 kg). Her oral temperature is 97.7 F (36.5 C). Her blood pressure is 106/55 (abnormal) and her pulse is 82. Her respiration is 17 and oxygen saturation is 100%.   Wt Readings from Last 3 Encounters:  08/23/21 106 lb (48.1 kg)  07/29/21 107 lb (48.5 kg)  07/25/21 107 lb 6.4 oz (48.7 kg)    Physical Exam Vitals reviewed.  HENT:     Head: Normocephalic and atraumatic.  Eyes:     Pupils: Pupils are equal, round, and reactive to light.  Cardiovascular:     Rate and Rhythm: Normal rate and regular rhythm.     Heart sounds: Normal heart sounds.   Pulmonary:     Effort: Pulmonary effort is normal.     Breath sounds: Normal breath sounds.  Abdominal:     General: Bowel sounds are normal.     Palpations: Abdomen is soft.  Musculoskeletal:        General: No tenderness or deformity. Normal range of motion.     Cervical back: Normal range of motion.  Lymphadenopathy:     Cervical: No cervical adenopathy.  Skin:    General: Skin is warm and dry.     Findings: No erythema or rash.  Neurological:     Mental Status: She is alert and oriented to person, place, and time.  Psychiatric:  Behavior: Behavior normal.        Thought Content: Thought content normal.        Judgment: Judgment normal.     Lab Results  Component Value Date   WBC 7.7 08/23/2021   HGB 10.9 (L) 08/23/2021   HCT 34.4 (L) 08/23/2021   MCV 93.5 08/23/2021   PLT 337 08/23/2021     Chemistry      Component Value Date/Time   NA 138 08/23/2021 1007   NA 139 11/24/2019 1527   K 4.0 08/23/2021 1007   CL 96 (L) 08/23/2021 1007   CO2 37 (H) 08/23/2021 1007   BUN 15 08/23/2021 1007   BUN 13 11/24/2019 1527   CREATININE 0.66 08/23/2021 1007      Component Value Date/Time   CALCIUM 9.7 08/23/2021 1007   ALKPHOS 79 08/23/2021 1007   AST 18 08/23/2021 1007   ALT 9 08/23/2021 1007   BILITOT 0.3 08/23/2021 1007      Impression and Plan: Ms. Tokarczyk is a very charming 76 year old white female.  I have known her for many years.  She actually got me and my wife together..  She now has a locally advanced squamous cell carcinoma of the left lung.  By the staging studies that we have, looks like this is a stage IIIB tumor.  She is not a candidate for any additional therapy.  We just want to make sure that she has good quality of life.  She is being followed by palliative care.  We did try to get hospice going.  She had hospice initially but then decided that this was too much for her.  I would not do another PET scan on her probably till after  [place].  Again I think that her prognosis will be clearly tied to her nutritional state.  Her weight will tell us this.  I will like to get her back in 1 month.  She can strengthen back sooner if necessary.    Volanda Napoleon, MD 2/17/202311:21 AM

## 2021-08-23 NOTE — Patient Instructions (Signed)

## 2021-08-23 NOTE — Addendum Note (Signed)
Addended by: Melton Krebs on: 08/23/2021 12:01 PM   Modules accepted: Orders

## 2021-08-28 DIAGNOSIS — J432 Centrilobular emphysema: Secondary | ICD-10-CM | POA: Diagnosis not present

## 2021-08-28 DIAGNOSIS — J9601 Acute respiratory failure with hypoxia: Secondary | ICD-10-CM | POA: Diagnosis not present

## 2021-08-28 DIAGNOSIS — J84112 Idiopathic pulmonary fibrosis: Secondary | ICD-10-CM | POA: Diagnosis not present

## 2021-08-28 DIAGNOSIS — Z9981 Dependence on supplemental oxygen: Secondary | ICD-10-CM | POA: Diagnosis not present

## 2021-08-28 DIAGNOSIS — Z9181 History of falling: Secondary | ICD-10-CM | POA: Diagnosis not present

## 2021-08-28 DIAGNOSIS — E440 Moderate protein-calorie malnutrition: Secondary | ICD-10-CM | POA: Diagnosis not present

## 2021-08-28 DIAGNOSIS — Z87891 Personal history of nicotine dependence: Secondary | ICD-10-CM | POA: Diagnosis not present

## 2021-08-28 DIAGNOSIS — C3432 Malignant neoplasm of lower lobe, left bronchus or lung: Secondary | ICD-10-CM | POA: Diagnosis not present

## 2021-08-28 DIAGNOSIS — F32A Depression, unspecified: Secondary | ICD-10-CM | POA: Diagnosis not present

## 2021-08-28 DIAGNOSIS — M810 Age-related osteoporosis without current pathological fracture: Secondary | ICD-10-CM | POA: Diagnosis not present

## 2021-08-28 DIAGNOSIS — D630 Anemia in neoplastic disease: Secondary | ICD-10-CM | POA: Diagnosis not present

## 2021-08-28 DIAGNOSIS — M47814 Spondylosis without myelopathy or radiculopathy, thoracic region: Secondary | ICD-10-CM | POA: Diagnosis not present

## 2021-08-30 ENCOUNTER — Ambulatory Visit (INDEPENDENT_AMBULATORY_CARE_PROVIDER_SITE_OTHER): Payer: Medicare Other

## 2021-08-30 ENCOUNTER — Ambulatory Visit: Payer: Medicare Other | Admitting: Internal Medicine

## 2021-08-30 ENCOUNTER — Encounter: Payer: Self-pay | Admitting: Hematology & Oncology

## 2021-08-30 ENCOUNTER — Other Ambulatory Visit: Payer: Self-pay

## 2021-08-30 ENCOUNTER — Encounter: Payer: Self-pay | Admitting: Internal Medicine

## 2021-08-30 VITALS — BP 110/70 | HR 76 | Temp 97.7°F | Ht 62.0 in | Wt 109.2 lb

## 2021-08-30 DIAGNOSIS — J8410 Pulmonary fibrosis, unspecified: Secondary | ICD-10-CM | POA: Diagnosis not present

## 2021-08-30 DIAGNOSIS — Z7189 Other specified counseling: Secondary | ICD-10-CM

## 2021-08-30 DIAGNOSIS — J84112 Idiopathic pulmonary fibrosis: Secondary | ICD-10-CM

## 2021-08-30 DIAGNOSIS — J9611 Chronic respiratory failure with hypoxia: Secondary | ICD-10-CM | POA: Diagnosis not present

## 2021-08-30 DIAGNOSIS — R197 Diarrhea, unspecified: Secondary | ICD-10-CM

## 2021-08-30 MED ORDER — DIPHENOXYLATE-ATROPINE 2.5-0.025 MG PO TABS: 1.0000 | ORAL_TABLET | Freq: Four times a day (QID) | ORAL | 0 refills | Status: DC | PRN

## 2021-08-30 MED ORDER — PREDNISONE 10 MG PO TABS: ORAL_TABLET | ORAL | 0 refills | Status: AC

## 2021-08-30 NOTE — Addendum Note (Signed)
Addended by: Suzzanne Cloud E on: 08/30/2021 03:34 PM   Modules accepted: Orders

## 2021-08-30 NOTE — Patient Instructions (Addendum)
ICD-10-CM   1. Chronic respiratory failure with hypoxia (HCC)  J96.11     2. IPF (idiopathic pulmonary fibrosis) ([place])  J84.112       Significantly better compared to when I saw you in the hospital  -Glad home physical therapy and close attention by your daughter is helping you  Maintaining yourself on 3 L nasal cannula  Overall goals of care is supportive and palliation oriented (no more chemo]  Some worsening shortness of breath and cough in the last few days -unclear why but could be bronchitis  Plan -Continue 3 L nasal cannula  -CMA to try to get you a portable oxygen system  - Chest x-ray two-view,  - Dp CBC, chemistry today or even 09/20/21 with DR Marin Olp -Short course low-dose prednisone taper [shared decision making given your prior history of intolerance to prednisone at higher dose]  -Prednisone 20 mg daily x2 days and then 10 mg daily x2 days and then 5 mg daily x2 days and stop -Keep up with the lung cancer appointment with Dr. Marin Olp - cotninue DNR -  Followu[ - 2 months with APP or RAmawamy - 30 min visit

## 2021-08-30 NOTE — Progress Notes (Signed)
[birth year]   -year-old female former smoker seen for pulmonary consult during hospitalization earlier this month for acute respiratory failure secondary to septic shock with pneumonia. 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  (Initial diagnosis September 2022) Pulmonary fibrosis and COPD.    Patient returns for a post hospital follow-up.  Patient was admitted earlier this month for prolonged hospitalization with acute respiratory failure secondary to septic shock with pneumonia.  Patient has underlying stage IIIb squamous cell lung cancer of the left lower lobe undergoing active treatment prior to admission.  Patient presented with febrile illness , hypotension, lactic acidosis.  Patient was treated with aggressive IV antibiotics, initially needed pressure support and IV hydration.  High-resolution CT chest showed UIP ILD/IPF, with superimposed patchy consolidation of the left lower lobes.  Viral panel was negative.  Strep pneumonia and Legionella were negative.  Patient was seen by pulmonary and critical care.  Patient completed a full course of antibiotics.  Continue to have hypoxemia and required oxygen at discharge at 3 L.  Today in the office was unable to tolerate POC on pulsed oxygen required continuous flow at 3 L. Serology during hospitalization showed a negative ANA, CCP and anti-DNA. Rheumatoid factor was elevated during hospitalization with acute illness..  Rheumatoid factor was elevated in 2021. Patient continues to follow with oncology.  Chemo is currently on hold.  Patient says since discharge she is feeling some better but remains very weak with low energy and activity tolerance.  Prior to admission patient was not on any inhalers.  She has quit smoking Her daughter is her caregiver.  She is beginning physical therapy and home health nursing at home  Says her appetite is  improved with no nausea vomiting or diarrhea.  She is using a walker currently.     OV 08/30/2021  Subjective:  Patient ID: Molly Welch, female , DOB: [birth year] , age 76 y.o. , MRN: [id number] , ADDRESS: [place] [id number] PCP Carolee Rota, NP Patient Care Team: Carolee Rota, NP as PCP - General (Nurse Practitioner) Volanda Napoleon, MD as Medical Oncologist (Oncology)  This Provider for this visit: Treatment Team:  Attending Provider: Brand Males, MD    08/30/2021 -   Chief Complaint  Patient presents with   Consult    Pt states she has had some pain under the breasts which has been happening for about a week now. Also states that she has been feeling a little more SOB.   IPF Stage IIIb lung cancer -course complicated by sepsis and pneumonia admission and respiratory failure Chronic hypoxemic respiratory failure 3 L nasal cannula Cachexia DNR and physical deconditioning  HPI TAMIRRA SIENKIEWICZ 76 y.o. -presents for follow-up with her daughter.  Since I last saw her in the hospital she is significantly better.  She is home now.  Daughter has moved in with her.  Daughter is giving her close support she has home physical therapy.  She is significantly improved but still requiring 3 L nasal cannula.  She told me that she has decided against any chemo or radiation.  The focus is just palliation and quality of life.  They do not want hospice because home physical therapy is visiting them and she is getting better and stronger.  They have a home palliative care instead.  But in the last week she is having some increasing shortness of  breath and cough but it is unclear if she is actually desaturating.  It appears that with 3 L nasal cannula she is holding her pulse ox.  The daughter is interested in a chest x-ray today.  She understands her prognosis of life expectancy is less than 1 year.  CT Chest data  No results found.    PFT  No flowsheet data  found.     has a past medical history of Abnormal chest x-ray (07/08/2003), Anxiety and depression, Depression, Goals of care, counseling/discussion (04/19/2021), Lung cancer, lower lobe ([place]) (04/19/2021), and Tobacco dependence.   reports that she quit smoking about 5 months ago. Her smoking use included cigarettes. She has a 27.50 pack-year smoking history. She has never used smokeless tobacco.  Past Surgical History:  Procedure Laterality Date   ABDOMINAL HYSTERECTOMY  1979   APPENDECTOMY  1966   CATARACT EXTRACTION, BILATERAL     IR IMAGING GUIDED PORT INSERTION  04/25/2021    Allergies  Allergen Reactions   Ativan [Lorazepam] Other (See Comments)   Doxycycline Swelling   Prednisone Other (See Comments)    Very emotional, crying, angry   Carbamates    Penicillin G Other (See Comments)   Sulfa Antibiotics Other (See Comments)   Penicillins Rash    Has patient had a PCN reaction causing immediate rash, facial/tongue/throat swelling, SOB or lightheadedness with hypotension: yes Has patient had a PCN reaction causing severe rash involving mucus membranes or skin necrosis: no Has patient had a PCN reaction that required hospitalization: no Has patient had a PCN reaction occurring within the last 10 years: no If all of the above answers are "NO", then may proceed with Cephalosporin use.    Sulfonamide Derivatives Rash    Immunization History  Administered Date(s) Administered   Influenza Split 04/10/2014   Influenza, High Dose Seasonal PF 05/01/2015, 03/25/2016, 05/05/2017, 05/17/2018, 04/14/2019, 04/25/2020   Influenza-Unspecified 04/26/2013, 06/14/2021   PFIZER(Purple Top)SARS-COV-2 Vaccination 08/29/2019, 09/19/2019   Pneumococcal Conjugate-13 05/01/2015   Pneumococcal Polysaccharide-23 04/26/2013   Tdap 04/26/2013   Zoster, Live 05/01/2015, 01/06/2020, 03/09/2020    Family History  Problem Relation Age of Onset   Suicidality Father    Heart disease Brother     Cirrhosis Brother    Depression Son    GI Disease Son    Arthritis Son    Diabetes Other      Current Outpatient Medications:    acetaminophen (TYLENOL) 500 MG tablet, Take 500 mg by mouth daily as needed for headache (pain)., Disp: , Rfl:    albuterol (VENTOLIN HFA) 108 (90 Base) MCG/ACT inhaler, Inhale 1-2 puffs into the lungs every 6 (six) hours as needed., Disp: 8 g, Rfl: 2   ALPRAZolam (XANAX) 1 MG tablet, Take 0.5-1 mg by mouth See admin instructions. Take one tablet (1 mg) by mouth at 5:30am, take 1/2 tablet (0.5 mg) at 7:30am, 3pm and at bedtime., Disp: , Rfl:    AMBULATORY NON FORMULARY MEDICATION, Lift Chair Diagnosis, Disp: 1 Device, Rfl: 0   AMBULATORY NON FORMULARY MEDICATION, Rollator Diagnosis G 62.89, Disp: 1 Device, Rfl: 0   Ascorbic Acid (VITAMIN C) 1000 MG tablet, Take 1,000 mg by mouth every morning., Disp: , Rfl:    azelastine (OPTIVAR) 0.05 % ophthalmic solution, Place 1 drop into both eyes 2 (two) times daily as needed (dry eyes/itching)., Disp: , Rfl:    diclofenac Sodium (VOLTAREN) 1 % GEL, Apply 1 application topically at bedtime., Disp: , Rfl:    feeding supplement (ENSURE ENLIVE /  ENSURE PLUS) LIQD, Take 237 mLs by mouth 3 (three) times daily between meals., Disp: , Rfl:    HYDROcodone-acetaminophen (NORCO/VICODIN) 5-325 MG tablet, Take 1 tablet by mouth every 4 (four) hours as needed for moderate pain., Disp: 60 tablet, Rfl: 0   hypromellose (SYSTANE OVERNIGHT THERAPY) 0.3 % GEL ophthalmic ointment, Place 1 application into both eyes at bedtime as needed for dry eyes., Disp: , Rfl:    lamoTRIgine (LAMICTAL) 100 MG tablet, Take 100 mg by mouth at bedtime., Disp: , Rfl:    lamoTRIgine (LAMICTAL) 25 MG tablet, Take 100 mg by mouth every morning., Disp: , Rfl:    lipase/protease/amylase (CREON) 36000 UNITS CPEP capsule, Take 36,000 Units by mouth See admin instructions. Take two capsules (72000 units) by mouth three times daily with meals and one capsule (36000 units)  with snacks, Disp: , Rfl:    Multiple Vitamin (MULTIVITAMIN WITH MINERALS) TABS tablet, Take 1 tablet by mouth every morning. Centrum, Disp: , Rfl:    Probiotic Product (PROBIOTIC PO), Take 1 tablet by mouth every morning., Disp: , Rfl:    prochlorperazine (COMPAZINE) 10 MG tablet, Take 1 tablet (10 mg total) by mouth every 6 (six) hours as needed (Nausea or vomiting)., Disp: 30 tablet, Rfl: 1   TURMERIC PO, Take 1 capsule by mouth daily., Disp: , Rfl:    valACYclovir (VALTREX) 500 MG tablet, Take 500 mg by mouth every morning., Disp: , Rfl:  No current facility-administered medications for this visit.  Facility-Administered Medications Ordered in Other Visits:    sodium chloride flush (NS) 0.9 % injection 10 mL, 10 mL, Intravenous, PRN, Volanda Napoleon, MD, 10 mL at 08/23/21 1127      Objective:   Vitals:   08/30/21 1449  BP: 110/70  Pulse: 76  Temp: 97.7 F (36.5 C)  TempSrc: Oral  SpO2: 99%  Weight: 109 lb 3.2 oz (49.5 kg)  Height: 5\' 2"  (1.575 m)    Estimated body mass index is 19.97 kg/m as calculated from the following:   Height as of this encounter: 5\' 2"  (1.575 m).   Weight as of this encounter: 109 lb 3.2 oz (49.5 kg).  @WEIGHTCHANGE @  [employer]   08/30/21 1449  Weight: 109 lb 3.2 oz (49.5 kg)     Physical ExamGeneral: No distress.  Thin female with a Port-A-Cath.  Cachectic has a mask on.  Frail and overall muscle wasting. Neuro: Alert and Oriented x 3. GCS 15. Speech normal Psych: Pleasant Resp:  Barrel Chest - no.  Wheeze - no, Crackles - yes, No overt respiratory distress CVS: Normal heart sounds. Murmurs - no Ext: Stigmata of Connective Tissue Disease - no HEENT: Normal upper airway. PEERL +. No post nasal drip        Assessment:       ICD-10-CM   1. Chronic respiratory failure with hypoxia (HCC)  J96.11     2. IPF (idiopathic pulmonary fibrosis) ([place])  J84.112     3. Goals of care, counseling/discussion  Z71.89          Plan:      Patient Instructions     ICD-10-CM   1. Chronic respiratory failure with hypoxia (HCC)  J96.11     2. IPF (idiopathic pulmonary fibrosis) ([place])  J84.112       Significantly better compared to when I saw you in the hospital  -Glad home physical therapy and close attention by your daughter is helping you  Maintaining yourself on 3 L nasal cannula  Overall goals of care is supportive and palliation oriented (no more chemo]  Some worsening shortness of breath and cough in the last few days -unclear why but could be bronchitis  Plan -Continue 3 L nasal cannula  -CMA to try to get you a portable oxygen system  - Chest x-ray two-view, CBC, chemistry today -Short course low-dose prednisone taper [shared decision making given your prior history of intolerance to prednisone at higher dose]  -Prednisone 20 mg daily x2 days and then 10 mg daily x2 days and then 5 mg daily x2 days and stop -Keep up with the lung cancer appointment with Dr. Marin Olp - cotninue DNR -  Followu[ - 2 months with APP or RAmawamy - 30 min visit    SIGNATURE    Dr. Brand Males, M.D., F.C.C.P,  Pulmonary and Critical Care Medicine Staff Physician, [facility] Director - Interstitial Lung Disease  Program  Pulmonary [facility] [place], [place], [address]  Pager: [phone], If no answer or between  15:00h - 7:00h: call [phone] Telephone: [phone]  3:14 PM 08/30/2021

## 2021-08-30 NOTE — Addendum Note (Signed)
Addended by: Lorretta Harp on: 08/30/2021 03:29 PM   Modules accepted: Orders

## 2021-08-30 NOTE — Telephone Encounter (Signed)
Per Dr Marin Olp Ensure could be causing the diarrhea. Recommend to stop it, will call in [place] and check back with [place] Monday if no better. Left detailed message on daughters VM (ok per DPR and will send MyChart message.

## 2021-08-30 NOTE — Addendum Note (Signed)
Addended by: Lorretta Harp on: 08/30/2021 03:35 PM   Modules accepted: Orders

## 2021-09-03 ENCOUNTER — Telehealth: Payer: Self-pay | Admitting: Internal Medicine

## 2021-09-03 NOTE — Progress Notes (Deleted)
° °  Cxr with lung cancer and IPF. Nothing new   Narrative & Impression  CLINICAL DATA:  Idiopathic pulmonary fibrosis.   EXAM: CHEST - 2 VIEW   COMPARISON:  July 25, 2021   FINDINGS: There is stable right-sided venous Port-A-Cath positioning. Stable marked severity, diffuse, reticular opacities are seen throughout both lungs. Stable ill-defined patchy opacities are again seen along the periphery of the mid lung fields, right greater than left. A stable appearing rounded opacity is seen within the left lung base which corresponds to the area of the patient's known tumor. There is no evidence of a pleural effusion or pneumothorax. The heart size and mediastinal contours are within normal limits. There is mild calcification of the aortic arch. The visualized skeletal structures are unremarkable.   IMPRESSION: 1. Findings consistent with the patient's history of anterior left lower lobe lung mass and idiopathic pulmonary fibrosis. Sequelae associated with superimposed infiltrates cannot be excluded.     Electronically Signed   By: Virgina Norfolk M.D.   On: 09/02/2021 21:24

## 2021-09-03 NOTE — Patient Instructions (Signed)
xxx

## 2021-09-03 NOTE — Telephone Encounter (Signed)
° °  Lung cancer + IPF.  Since Jan 2023. No new interventions   Narrative & Impression  CLINICAL DATA:  Idiopathic pulmonary fibrosis.   EXAM: CHEST - 2 VIEW   COMPARISON:  July 25, 2021   FINDINGS: There is stable right-sided venous Port-A-Cath positioning. Stable marked severity, diffuse, reticular opacities are seen throughout both lungs. Stable ill-defined patchy opacities are again seen along the periphery of the mid lung fields, right greater than left. A stable appearing rounded opacity is seen within the left lung base which corresponds to the area of the patient's known tumor. There is no evidence of a pleural effusion or pneumothorax. The heart size and mediastinal contours are within normal limits. There is mild calcification of the aortic arch. The visualized skeletal structures are unremarkable.   IMPRESSION: 1. Findings consistent with the patient's history of anterior left lower lobe lung mass and idiopathic pulmonary fibrosis. Sequelae associated with superimposed infiltrates cannot be excluded.     Electronically Signed   By: Virgina Norfolk M.D.   On: 09/02/2021 21:24

## 2021-09-04 DIAGNOSIS — Z87891 Personal history of nicotine dependence: Secondary | ICD-10-CM | POA: Diagnosis not present

## 2021-09-04 DIAGNOSIS — M47814 Spondylosis without myelopathy or radiculopathy, thoracic region: Secondary | ICD-10-CM | POA: Diagnosis not present

## 2021-09-04 DIAGNOSIS — J84112 Idiopathic pulmonary fibrosis: Secondary | ICD-10-CM | POA: Diagnosis not present

## 2021-09-04 DIAGNOSIS — J9601 Acute respiratory failure with hypoxia: Secondary | ICD-10-CM | POA: Diagnosis not present

## 2021-09-04 DIAGNOSIS — Z9981 Dependence on supplemental oxygen: Secondary | ICD-10-CM | POA: Diagnosis not present

## 2021-09-04 DIAGNOSIS — E440 Moderate protein-calorie malnutrition: Secondary | ICD-10-CM | POA: Diagnosis not present

## 2021-09-04 DIAGNOSIS — C3432 Malignant neoplasm of lower lobe, left bronchus or lung: Secondary | ICD-10-CM | POA: Diagnosis not present

## 2021-09-04 DIAGNOSIS — D630 Anemia in neoplastic disease: Secondary | ICD-10-CM | POA: Diagnosis not present

## 2021-09-04 DIAGNOSIS — M810 Age-related osteoporosis without current pathological fracture: Secondary | ICD-10-CM | POA: Diagnosis not present

## 2021-09-04 DIAGNOSIS — Z9181 History of falling: Secondary | ICD-10-CM | POA: Diagnosis not present

## 2021-09-04 DIAGNOSIS — J432 Centrilobular emphysema: Secondary | ICD-10-CM | POA: Diagnosis not present

## 2021-09-04 DIAGNOSIS — F32A Depression, unspecified: Secondary | ICD-10-CM | POA: Diagnosis not present

## 2021-09-04 NOTE — Telephone Encounter (Signed)
Attempted to call pt's daughter [name] to let her know the results of the cxr but unable to reach. Left message for her to return call. ?

## 2021-09-08 DIAGNOSIS — J9601 Acute respiratory failure with hypoxia: Secondary | ICD-10-CM | POA: Diagnosis not present

## 2021-09-12 DIAGNOSIS — F32A Depression, unspecified: Secondary | ICD-10-CM | POA: Diagnosis not present

## 2021-09-12 DIAGNOSIS — Z9181 History of falling: Secondary | ICD-10-CM | POA: Diagnosis not present

## 2021-09-12 DIAGNOSIS — J9601 Acute respiratory failure with hypoxia: Secondary | ICD-10-CM | POA: Diagnosis not present

## 2021-09-12 DIAGNOSIS — M810 Age-related osteoporosis without current pathological fracture: Secondary | ICD-10-CM | POA: Diagnosis not present

## 2021-09-12 DIAGNOSIS — Z9981 Dependence on supplemental oxygen: Secondary | ICD-10-CM | POA: Diagnosis not present

## 2021-09-12 DIAGNOSIS — M47814 Spondylosis without myelopathy or radiculopathy, thoracic region: Secondary | ICD-10-CM | POA: Diagnosis not present

## 2021-09-12 DIAGNOSIS — C3432 Malignant neoplasm of lower lobe, left bronchus or lung: Secondary | ICD-10-CM | POA: Diagnosis not present

## 2021-09-12 DIAGNOSIS — J84112 Idiopathic pulmonary fibrosis: Secondary | ICD-10-CM | POA: Diagnosis not present

## 2021-09-12 DIAGNOSIS — E440 Moderate protein-calorie malnutrition: Secondary | ICD-10-CM | POA: Diagnosis not present

## 2021-09-12 DIAGNOSIS — D630 Anemia in neoplastic disease: Secondary | ICD-10-CM | POA: Diagnosis not present

## 2021-09-12 DIAGNOSIS — Z87891 Personal history of nicotine dependence: Secondary | ICD-10-CM | POA: Diagnosis not present

## 2021-09-12 DIAGNOSIS — J432 Centrilobular emphysema: Secondary | ICD-10-CM | POA: Diagnosis not present

## 2021-09-13 DIAGNOSIS — M47814 Spondylosis without myelopathy or radiculopathy, thoracic region: Secondary | ICD-10-CM | POA: Diagnosis not present

## 2021-09-13 DIAGNOSIS — J9601 Acute respiratory failure with hypoxia: Secondary | ICD-10-CM | POA: Diagnosis not present

## 2021-09-13 DIAGNOSIS — Z9981 Dependence on supplemental oxygen: Secondary | ICD-10-CM | POA: Diagnosis not present

## 2021-09-13 DIAGNOSIS — F32A Depression, unspecified: Secondary | ICD-10-CM | POA: Diagnosis not present

## 2021-09-13 DIAGNOSIS — M810 Age-related osteoporosis without current pathological fracture: Secondary | ICD-10-CM | POA: Diagnosis not present

## 2021-09-13 DIAGNOSIS — J432 Centrilobular emphysema: Secondary | ICD-10-CM | POA: Diagnosis not present

## 2021-09-13 DIAGNOSIS — D630 Anemia in neoplastic disease: Secondary | ICD-10-CM | POA: Diagnosis not present

## 2021-09-13 DIAGNOSIS — J84112 Idiopathic pulmonary fibrosis: Secondary | ICD-10-CM | POA: Diagnosis not present

## 2021-09-13 DIAGNOSIS — C3432 Malignant neoplasm of lower lobe, left bronchus or lung: Secondary | ICD-10-CM | POA: Diagnosis not present

## 2021-09-13 DIAGNOSIS — E440 Moderate protein-calorie malnutrition: Secondary | ICD-10-CM | POA: Diagnosis not present

## 2021-09-17 ENCOUNTER — Other Ambulatory Visit: Payer: Medicare Other | Admitting: Internal Medicine

## 2021-09-17 ENCOUNTER — Other Ambulatory Visit: Payer: Self-pay

## 2021-09-17 ENCOUNTER — Encounter: Payer: Self-pay | Admitting: Internal Medicine

## 2021-09-17 VITALS — Wt 107.5 lb

## 2021-09-17 DIAGNOSIS — Z515 Encounter for palliative care: Secondary | ICD-10-CM | POA: Diagnosis not present

## 2021-09-17 DIAGNOSIS — E440 Moderate protein-calorie malnutrition: Secondary | ICD-10-CM | POA: Diagnosis not present

## 2021-09-17 DIAGNOSIS — R5381 Other malaise: Secondary | ICD-10-CM

## 2021-09-17 DIAGNOSIS — J9601 Acute respiratory failure with hypoxia: Secondary | ICD-10-CM | POA: Diagnosis not present

## 2021-09-17 DIAGNOSIS — Z9981 Dependence on supplemental oxygen: Secondary | ICD-10-CM | POA: Diagnosis not present

## 2021-09-17 DIAGNOSIS — Z9181 History of falling: Secondary | ICD-10-CM | POA: Diagnosis not present

## 2021-09-17 DIAGNOSIS — D630 Anemia in neoplastic disease: Secondary | ICD-10-CM | POA: Diagnosis not present

## 2021-09-17 DIAGNOSIS — Z87891 Personal history of nicotine dependence: Secondary | ICD-10-CM | POA: Diagnosis not present

## 2021-09-17 DIAGNOSIS — C3432 Malignant neoplasm of lower lobe, left bronchus or lung: Secondary | ICD-10-CM | POA: Diagnosis not present

## 2021-09-17 DIAGNOSIS — M810 Age-related osteoporosis without current pathological fracture: Secondary | ICD-10-CM | POA: Diagnosis not present

## 2021-09-17 DIAGNOSIS — J84112 Idiopathic pulmonary fibrosis: Secondary | ICD-10-CM | POA: Diagnosis not present

## 2021-09-17 DIAGNOSIS — F32A Depression, unspecified: Secondary | ICD-10-CM | POA: Diagnosis not present

## 2021-09-17 DIAGNOSIS — M47814 Spondylosis without myelopathy or radiculopathy, thoracic region: Secondary | ICD-10-CM | POA: Diagnosis not present

## 2021-09-17 DIAGNOSIS — J432 Centrilobular emphysema: Secondary | ICD-10-CM | POA: Diagnosis not present

## 2021-09-17 NOTE — Progress Notes (Signed)
[facility] Visit Telephone: [phone]  Fax: [phone]    Date of encounter: 09/17/21 7:07 AM PATIENT NAME: Molly Welch [address] [place] [address]   [phone] (home)  DOB: [birth year] MRN: [id number] PRIMARY CARE PROVIDER:    Carolee Rota, NP,  [place] [address] [phone]   REFERRING PROVIDER:   Dr. Burney Gauze, hematology/oncology   RESPONSIBLE PARTY:    Contact Information       Name Relation Home Work Mobile    Burling,Jennifer Daughter [phone]   [phone]             I met face to face with patient and family in her home. Palliative Care was asked to follow this patient by consultation request of Dr. Burney Gauze to address advance care planning and complex medical decision making. This is follow-up visit.                                       ASSESSMENT AND PLAN / RECOMMENDATIONS:    Advance Care Planning/Goals of Care: Goals include to maximize quality of life and symptom management. Patient/health care surrogate gave his/her permission to discuss.Our advance care planning conversation included a discussion about:    The value and importance of advance care planning  Experiences with loved ones who have been seriously ill or have died  Exploration of personal, cultural or spiritual beliefs that might influence medical decisions  Exploration of goals of care in the event of a sudden injury or illness  Identification of a healthcare agent  Review and updating or creation of an advance directive document . Decision not to resuscitate or to de-escalate disease focused treatments due to poor prognosis. CODE STATUS:  DNR. Patients goal is to stay home and have the best quality of life possible. She does not want to have any aggressive measures or go back to the hospital. Reviewed MOST form with patient and her daughter, [name]. Choices were made as follows: DNR,  Comfort only measures, ABX to be determined at the time of infections, IVF for a defined trial period, No feeding tube.    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 She is 02 dependent on 3-4L [place]. Mild dyspnea with conversational speech and 02 sat 84% on 3L [place]. Using rescue inhaler more frequently, now with spacer to increase effectiveness. Using IS Q1H during the day. Weight stable and improved mobility with PT/OT. Plan: Continue to focus on comfort and quality of life. Next scheduled visit with Dr. Marin Olp this week and next PET scheduled in April.  Malnutrition/Weight Loss: Weights have been stable 106lbs-109lbs over the last two months. Most recent on 09/15/21 107.5 lbs. Now eating jelly toast or Pop Tart for breakfast, about 50% for lunch and dinner meals, plus snacks like yogurt, applesauce, or jello. Regular Ensure was causing increased diarrhea but found she can better tolerate Ensure Max and drinks this daily. Diarrhea now better managed with bland diet and Lomotil PRN. Prealbumin on 08/23/21 was 27 (improved from 17.1 two months ago and 16.7 five months ago). Plan: continue of offer food choices to maximize nutritional content but do not exacerbate diarrhea.  Continue Lomotil PRN. Encourage fluid and electrolyte replacement with diarrhea to prevent dehydration.  Weakness/Debility: Working with PT/OT and making small improvements in physical conditioning. Able to ambulate with walker, gait belt, and supervision short distances in and around home. Exercises for BLE strengthening with PT include squats and ankle weights. Tries to get up and do some movement about every two hours during the day. Needs one assist for ADL's, including getting in and out of shower. Plan:  Continue PT/OT as needed for strength, endurance, and safety. Fall precautions - safety bars in bathroom, walker, and other DME as needed.  6. Palliative care by specialist: Ongoing discussion about goals of care which include continuing PT/OT for strengthening, weight gain, enjoying time with family and being able to participate in activities she enjoys. -MOST completed: DNR, comfort measures, AX TBD, IVF trial, No feeding tube.     Follow up Palliative Care Visit: Palliative care will continue to follow for complex medical decision making, advance care planning, and clarification of goals. Return 4-6 weeks or prn.    This visit was coded based on medical decision making (MDM).  20 minutes on ACP completing MOST.     PPS: 40%   HOSPICE ELIGIBILITY/DIAGNOSIS: TBD   Chief Complaint: Follow-up palliative visit   HISTORY OF PRESENT ILLNESS:  Molly Welch is a 76 y.o. year old female with PMH significant for malignant neoplasm left lower lung, ILD - idiopathic pulmonary fibrosis, chronic respiratory failure, former smoker, abnormal weight loss, protein calorie malnutrition, anemia, and anxiety seen today for palliative care follow up in home. Patient reports doing fairly well. She is very social and engaging and enjoys sitting outside, spending time with friends/family, and doing puzzles. She is taking Melatonin to help with sleep and meditating to help with anxiety. Reports "aching bones" first thing in the morning that she attributes to aging, and states this improves with moving around. She is 02 dependent on 3-4L [place] continuous, and she get mildly tachypneic and dyspneic with conversation. She has been using her rescue inhaler more often and has started using a spacer to make this more effective. Using IS Q1H during the day. Appetite is fair, she eats jelly toast or a poptart for breakfast and about 50% meals for lunch and dinner. She also eats some snacks like yogurt, applesauce, and jello, as  well as Ensure Max PRN. She sticks to a bland diet to help manage ongoing diarrhea and takes Lomotil PRN with good results. She has had at least one incontinent episode with diarrhea based on what she eats. She is thoughtful about hydration and sips on Propel water throughout the day to replenish electrolytes. She has been working with PT/OT for strengthening, endurance, and safety. She is able to ambulate in home with her walker and supervision. She does home exercises and tries to get up at least every 2 hours during the day. She requires one assist for ADL's, getting in and out of shower. Skin is dry and itchy on scalp, using baby shampoo. Had some yeast under her left breast that was treated with Lotramin - now resolved. Dry scaley skin in soles of feet - daughter tries to keep moisturized. Patient still wishes to focus on comfort and quality of life. Reports some financial concerns related to medical bills - started a Go-Fund me page, and looking into Medicaid. Will offer MSW to follow up with possible resources to help with this concern. Plans to follow up with Dr. Marin Olp this week and has  another PET scan scheduled in April.   History obtained from review of EMR, discussion with primary team, and interview with family, facility staff/caregiver and/or Ms. Wing.  I reviewed available labs, medications, imaging, studies and related documents from the EMR.  Records reviewed and summarized above.    ROS   General: NAD EYES: denies vision changes ENMT: denies dysphagia Cardiovascular: denies chest pain, positive mild-moderate DOE Pulmonary: denies cough, mild conversational SOB Abdomen: endorses good appetite, endorses diarrhea with occasional incontinence of bowel - affected by diet, better with bland foods and taking Lomotil as needed. GU: denies dysuria, endorses continence of urine MSK:  denies increased weakness, working with PT/OT for strengthening, mobility, and safety, no falls  reported Skin: dry, itchy scalp - using baby shampoo. Yeast under left breast resolved with Lotramin. Dry scaley skin on soles of feet. Neurological: denies pain, denies insomnia with Melatonin Psych: Endorses positive mood Heme/lymph/immuno: denies bruises, abnormal bleeding   Physical Exam: Current and past weights: 107.5 lbs on 09/15/21 (106-109 in the last two months) Ht: 5'2" BMI 19.97 Constitutional: NAD General: frail appearing, thin  EYES: anicteric sclera, lids intact, no discharge  ENMT: intact hearing, oral mucous membranes moist, dentition intact CV: S1S2, RRR, right upper chest port with no S/S infection, no LE edema Pulmonary: Lungs have bilateral crackles associated with ILD, mild tachypnea with speech, no cough, 84% on 3L [place] continuous Abdomen: intake better, about 50% of 3 meals/day plus snacks and Ensure max, normo-active BS + 4 quadrants, soft and non-tender, no ascites GU: deferred MSK: no sarcopenia, moves all extremities, ambulatory with walker, gait belt, and supervision Skin: warm and dry, no rashes or wounds on visible skin Neuro:  generalized weakness, no cognitive impairment Psych: non-anxious affect, A and O x 3 Hem/lymph/immuno: no widespread bruising  CURRENT PROBLEM LIST:       Patient Active Problem List    Diagnosis Date Noted   Chronic respiratory failure with hypoxia ([facility]) 07/25/2021   IPF (idiopathic pulmonary fibrosis) ([facility])        Class: Chronic   ILD (interstitial lung disease) ([place])     Malnutrition of moderate degree 07/04/2021   Septic shock ([place]) 07/03/2021   Acute respiratory failure with hypoxemia ([place]) 07/03/2021   Lactic acidosis 07/03/2021   Hyponatremia 07/03/2021   Leukocytosis 07/03/2021   Anemia of chronic disease 07/03/2021   Abnormal chest x-ray 07/03/2021   Acute hypoxemic respiratory failure due to COVID-19 [facility]) 07/03/2021   Goals of care, counseling/discussion 04/19/2021   Lung cancer, lower lobe ([place]) 04/19/2021    Mass of left lung     Recurrent falls 03/24/2021   CAP (community acquired pneumonia) 03/23/2021   Vertigo 03/23/2021   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   Tobacco abuse 03/23/2021   Debility 03/23/2021   Intractable nausea and vomiting 03/23/2021   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   Tremor of unknown origin 10/18/2020   Anxiety     CONTACT DERMATITIS&OTHER ECZEMA DUE TO PLANTS 08/17/2010    PAST MEDICAL HISTORY:      Active Ambulatory Problems    Diagnosis Date Noted   CONTACT DERMATITIS&OTHER ECZEMA DUE TO PLANTS 08/17/2010   Anxiety     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   Tremor of unknown origin 10/18/2020   CAP (community acquired pneumonia) 03/23/2021   Vertigo 03/23/2021   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   Tobacco abuse 03/23/2021   Debility 03/23/2021   Intractable nausea and  vomiting 03/23/2021   Recurrent falls 03/24/2021   Mass of left lung     Goals of care, counseling/discussion 04/19/2021   Lung cancer, lower lobe ([place]) 04/19/2021   Septic shock ([place]) 07/03/2021   Acute respiratory failure with hypoxemia ([place]) 07/03/2021   Lactic acidosis 07/03/2021   Hyponatremia 07/03/2021   Leukocytosis 07/03/2021   Anemia of chronic disease 07/03/2021   Abnormal chest x-ray 07/03/2021   Acute hypoxemic respiratory failure due to COVID-19 [facility]) 07/03/2021   Malnutrition of moderate degree 07/04/2021   ILD (interstitial lung disease) ([place])     IPF (idiopathic pulmonary fibrosis) ([place])     Chronic respiratory failure with hypoxia ([place]) 07/25/2021        Resolved Ambulatory Problems    Diagnosis Date Noted   No Resolved Ambulatory Problems        Past Medical History:  Diagnosis Date   Anxiety and depression     Depression     Tobacco dependence      SOCIAL HX:  Social History         Tobacco Use   Smoking status: Former       Packs/day: 0.50      Years: 55.00      Pack years: 27.50      Types: Cigarettes      Quit date: 03/07/2021      Years since quitting: 0.5   Smokeless tobacco: Never  Substance Use Topics   Alcohol use: No        ALLERGIES:       Allergies  Allergen Reactions   Ativan [Lorazepam] Other (See Comments)   Doxycycline Swelling   Prednisone Other (See Comments)      Very emotional, crying, angry   Carbamates     Penicillin G Other (See Comments)   Sulfa Antibiotics Other (See Comments)   Penicillins Rash      Has patient had a PCN reaction causing immediate rash, facial/tongue/throat swelling, SOB or lightheadedness with hypotension: yes Has patient had a PCN reaction causing severe rash involving mucus membranes or skin necrosis: no Has patient had a PCN reaction that required hospitalization: no Has patient had a PCN reaction occurring within the last 10 years: no If all of the above answers are "NO", then may proceed with Cephalosporin use.     Sulfonamide Derivatives Rash     PERTINENT MEDICATIONS:      Outpatient Encounter Medications as of 09/17/2021  Medication Sig   acetaminophen (TYLENOL) 500 MG tablet Take 500 mg by mouth daily as needed for headache (pain).   albuterol (VENTOLIN HFA) 108 (90 Base) MCG/ACT inhaler Inhale 1-2 puffs into the lungs every 6 (six) hours as needed.   ALPRAZolam (XANAX) 1 MG tablet Take 0.5-1 mg by mouth See admin instructions. Take one tablet (1 mg) by mouth at 5:30am, take 1/2 tablet (0.5 mg) at 7:30am, 3pm and at bedtime.   AMBULATORY NON FORMULARY MEDICATION Lift Chair Diagnosis   AMBULATORY NON FORMULARY MEDICATION Rollator Diagnosis G 62.89   Ascorbic Acid (VITAMIN C) 1000 MG tablet Take 1,000 mg by mouth every morning.   azelastine (OPTIVAR) 0.05 % ophthalmic solution Place 1 drop into both eyes 2 (two) times daily as needed (dry eyes/itching).   diclofenac Sodium (VOLTAREN) 1 % GEL Apply 1 application topically at bedtime.    diphenoxylate-atropine (LOMOTIL) 2.5-0.025 MG tablet Take 1 tablet by mouth 4 (four) times daily as needed for diarrhea or loose stools.   feeding supplement (ENSURE ENLIVE / ENSURE  PLUS) LIQD Take 237 mLs by mouth 3 (three) times daily between meals.   HYDROcodone-acetaminophen (NORCO/VICODIN) 5-325 MG tablet Take 1 tablet by mouth every 4 (four) hours as needed for moderate pain.   hypromellose (SYSTANE OVERNIGHT THERAPY) 0.3 % GEL ophthalmic ointment Place 1 application into both eyes at bedtime as needed for dry eyes.   lamoTRIgine (LAMICTAL) 100 MG tablet Take 100 mg by mouth at bedtime.   lamoTRIgine (LAMICTAL) 25 MG tablet Take 100 mg by mouth every morning.   lipase/protease/amylase (CREON) 36000 UNITS CPEP capsule Take 36,000 Units by mouth See admin instructions. Take two capsules (72000 units) by mouth three times daily with meals and one capsule (36000 units) with snacks   Multiple Vitamin (MULTIVITAMIN WITH MINERALS) TABS tablet Take 1 tablet by mouth every morning. Centrum   Probiotic Product (PROBIOTIC PO) Take 1 tablet by mouth every morning.   prochlorperazine (COMPAZINE) 10 MG tablet Take 1 tablet (10 mg total) by mouth every 6 (six) hours as needed (Nausea or vomiting).   TURMERIC PO Take 1 capsule by mouth daily.   valACYclovir (VALTREX) 500 MG tablet Take 500 mg by mouth every morning.       Facility-Administered Encounter Medications as of 09/17/2021  Medication   sodium chloride flush (NS) 0.9 % injection 10 mL    Thank you for the opportunity to participate in the care of Ms. Degraffenreid.  The palliative care team will continue to follow. Please call our office at [phone] if we can be of additional assistance.    Hollace Kinnier, DO   COVID-19 PATIENT SCREENING TOOL Asked and negative response unless otherwise noted:   Have you had symptoms of covid, tested positive or been in contact with someone with symptoms/positive test in the past 5-10 days? no

## 2021-09-20 ENCOUNTER — Inpatient Hospital Stay: Payer: Medicare Other | Admitting: Hematology & Oncology

## 2021-09-20 ENCOUNTER — Inpatient Hospital Stay: Payer: Medicare Other

## 2021-09-20 ENCOUNTER — Inpatient Hospital Stay: Payer: Medicare Other | Attending: Hematology and Oncology

## 2021-09-20 ENCOUNTER — Other Ambulatory Visit: Payer: Self-pay

## 2021-09-20 VITALS — BP 130/80 | HR 69 | Temp 97.8°F | Resp 17 | Wt 108.0 lb

## 2021-09-20 DIAGNOSIS — C3431 Malignant neoplasm of lower lobe, right bronchus or lung: Secondary | ICD-10-CM

## 2021-09-20 DIAGNOSIS — R197 Diarrhea, unspecified: Secondary | ICD-10-CM | POA: Diagnosis not present

## 2021-09-20 DIAGNOSIS — C3430 Malignant neoplasm of lower lobe, unspecified bronchus or lung: Secondary | ICD-10-CM | POA: Diagnosis not present

## 2021-09-20 DIAGNOSIS — Z79899 Other long term (current) drug therapy: Secondary | ICD-10-CM | POA: Diagnosis not present

## 2021-09-20 DIAGNOSIS — C3432 Malignant neoplasm of lower lobe, left bronchus or lung: Secondary | ICD-10-CM | POA: Diagnosis not present

## 2021-09-20 DIAGNOSIS — Z95828 Presence of other vascular implants and grafts: Secondary | ICD-10-CM

## 2021-09-20 LAB — CMP (CANCER CENTER ONLY)
ALT: 9 U/L (ref 0–44)
AST: 18 U/L (ref 15–41)
Albumin: 4.1 g/dL (ref 3.5–5.0)
Alkaline Phosphatase: 59 U/L (ref 38–126)
Anion gap: 7 (ref 5–15)
BUN: 19 mg/dL (ref 8–23)
CO2: 35 mmol/L — ABNORMAL HIGH (ref 22–32)
Calcium: 9.8 mg/dL (ref 8.9–10.3)
Chloride: 98 mmol/L (ref 98–111)
Creatinine: 0.58 mg/dL (ref 0.44–1.00)
GFR, Estimated: >60 mL/min (ref >60)
Glucose, Bld: 95 mg/dL (ref 70–99)
Potassium: 3.5 mmol/L (ref 3.5–5.1)
Sodium: 140 mmol/L (ref 135–145)
Total Bilirubin: 0.4 mg/dL (ref 0.3–1.2)
Total Protein: 7.4 g/dL (ref 6.5–8.1)

## 2021-09-20 LAB — CBC WITH DIFFERENTIAL (CANCER CENTER ONLY)
Abs Immature Granulocytes: 0.02 10*3/uL (ref 0.00–0.07)
Basophils Absolute: 0 10*3/uL (ref 0.0–0.1)
Basophils Relative: 0 %
Eosinophils Absolute: 0.1 10*3/uL (ref 0.0–0.5)
Eosinophils Relative: 1 %
HCT: 33.4 % — ABNORMAL LOW (ref 36.0–46.0)
Hemoglobin: 10.7 g/dL — ABNORMAL LOW (ref 12.0–15.0)
Immature Granulocytes: 0 %
Lymphocytes Relative: 20 %
Lymphs Abs: 1.6 10*3/uL (ref 0.7–4.0)
MCH: 30.3 pg (ref 26.0–34.0)
MCHC: 32 g/dL (ref 30.0–36.0)
MCV: 94.6 fL (ref 80.0–100.0)
Monocytes Absolute: 0.6 10*3/uL (ref 0.1–1.0)
Monocytes Relative: 8 %
Neutro Abs: 5.5 10*3/uL (ref 1.7–7.7)
Neutrophils Relative %: 71 %
Platelet Count: 269 10*3/uL (ref 150–400)
RBC: 3.53 MIL/uL — ABNORMAL LOW (ref 3.87–5.11)
RDW: 13.9 % (ref 11.5–15.5)
WBC Count: 7.8 10*3/uL (ref 4.0–10.5)
nRBC: 0 % (ref 0.0–0.2)

## 2021-09-20 LAB — LACTATE DEHYDROGENASE: LDH: 168 U/L (ref 98–192)

## 2021-09-20 LAB — PREALBUMIN: Prealbumin: 19.3 mg/dL (ref 18–38)

## 2021-09-20 MED ORDER — DIPHENOXYLATE-ATROPINE 2.5-0.025 MG PO TABS: 1.0000 | ORAL_TABLET | Freq: Four times a day (QID) | ORAL | 0 refills | Status: DC | PRN

## 2021-09-20 MED ORDER — HEPARIN SOD (PORK) LOCK FLUSH 100 UNIT/ML IV SOLN
500.0000 [IU] | Freq: Once | INTRAVENOUS | Status: AC
  Administered 2021-09-20: 500 [IU] via INTRAVENOUS

## 2021-09-20 MED ORDER — HYDROCODONE-ACETAMINOPHEN 5-325 MG PO TABS: 1.0000 | ORAL_TABLET | ORAL | 0 refills | Status: DC | PRN

## 2021-09-20 MED ORDER — SODIUM CHLORIDE 0.9% FLUSH
10.0000 mL | Freq: Once | INTRAVENOUS | Status: AC
  Administered 2021-09-20: 10 mL via INTRAVENOUS

## 2021-09-20 NOTE — Progress Notes (Signed)
?Hematology and Oncology Follow Up Visit ? ?Molly Welch ?[id number] ?[birth year] 76 y.o. ?09/20/2021 ? ? 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 ?Current Therapy:   ?Carbo/Taxol/Yervoy/Opdivo --s/p cycle #4 on --started 04/26/2021 -- d/c on 07/29/2021 ?    ?Interim History:  Molly Welch is back for follow-up.  So far, she is doing pretty well.  She is at home.  Her daughter is really doing a great job taking care of her..  The big problem is her diarrhea.  I am not sure why she would have the diarrhea.  Lomotil seems to help this. ? ?She is eating.  Her weight is up a couple pounds. ? ?There is no cough.  She had no shortness of breath.  She has had no hemoptysis.  There has been no fever.  She has had no leg swelling.  She has had no rashes. ? ?Overall, I would say performance status is ECOG 2.   ? ? ?Medications:  ?Current Outpatient Medications:  ?  acetaminophen (TYLENOL) 500 MG tablet, Take 500 mg by mouth daily as needed for headache (pain)., Disp: , Rfl:  ?  albuterol (VENTOLIN HFA) 108 (90 Base) MCG/ACT inhaler, Inhale 1-2 puffs into the lungs every 6 (six) hours as needed., Disp: 8 g, Rfl: 2 ?  ALPRAZolam (XANAX) 1 MG tablet, Take 0.5-1 mg by mouth See admin instructions. Take one tablet (1 mg) by mouth at 5:30am, take 1/2 tablet (0.5 mg) at 7:30am, 3pm and at bedtime., Disp: , Rfl:  ?  AMBULATORY NON FORMULARY MEDICATION, Lift Chair Diagnosis, Disp: 1 Device, Rfl: 0 ?  AMBULATORY NON FORMULARY MEDICATION, Rollator Diagnosis G 62.89, Disp: 1 Device, Rfl: 0 ?  Ascorbic Acid (VITAMIN C) 1000 MG tablet, Take 1,000 mg by mouth every morning., Disp: , Rfl:  ?  azelastine (OPTIVAR) 0.05 % ophthalmic solution, Place 1 drop into both eyes 2 (two) times daily as needed (dry eyes/itching)., Disp: , Rfl:  ?  diclofenac Sodium (VOLTAREN) 1 % GEL, Apply 1 application topically at bedtime., Disp: , Rfl:  ?  feeding supplement (ENSURE  ENLIVE / ENSURE PLUS) LIQD, Take 237 mLs by mouth 3 (three) times daily between meals., Disp: , Rfl:  ?  hypromellose (SYSTANE OVERNIGHT THERAPY) 0.3 % GEL ophthalmic ointment, Place 1 application into both eyes at bedtime as needed for dry eyes., Disp: , Rfl:  ?  lamoTRIgine (LAMICTAL) 100 MG tablet, Take 100 mg by mouth at bedtime., Disp: , Rfl:  ?  lamoTRIgine (LAMICTAL) 25 MG tablet, Take 100 mg by mouth every morning., Disp: , Rfl:  ?  lipase/protease/amylase (CREON) 36000 UNITS CPEP capsule, Take 36,000 Units by mouth See admin instructions. Take two capsules (72000 units) by mouth three times daily with meals and one capsule (36000 units) with snacks, Disp: , Rfl:  ?  Multiple Vitamin (MULTIVITAMIN WITH MINERALS) TABS tablet, Take 1 tablet by mouth every morning. Centrum, Disp: , Rfl:  ?  Probiotic Product (PROBIOTIC PO), Take 1 tablet by mouth every morning., Disp: , Rfl:  ?  prochlorperazine (COMPAZINE) 10 MG tablet, Take 1 tablet (10 mg total) by mouth every 6 (six) hours as needed (Nausea or vomiting)., Disp: 30 tablet, Rfl: 1 ?  TURMERIC PO, Take 1 capsule by mouth daily., Disp: , Rfl:  ?  valACYclovir (VALTREX) 500 MG tablet, Take 500 mg by mouth every morning., Disp: , Rfl:  ?  diphenoxylate-atropine (LOMOTIL) 2.5-0.025 MG tablet, Take  1 tablet by mouth 4 (four) times daily as needed for diarrhea or loose stools., Disp: 100 tablet, Rfl: 0 ?  HYDROcodone-acetaminophen (NORCO/VICODIN) 5-325 MG tablet, Take 1 tablet by mouth every 4 (four) hours as needed for moderate pain., Disp: 60 tablet, Rfl: 0 ?No current facility-administered medications for this visit. ? ?Facility-Administered Medications Ordered in Other Visits:  ?  heparin lock flush 100 unit/mL, 500 Units, Intravenous, Once, Isiaha Greenup, Rudell Cobb, MD ?  sodium chloride flush (NS) 0.9 % injection 10 mL, 10 mL, Intravenous, PRN, Volanda Napoleon, MD, 10 mL at 08/23/21 1127 ?  sodium chloride flush (NS) 0.9 % injection 10 mL, 10 mL, Intravenous, Once,  Katyana Trolinger, Rudell Cobb, MD ? ?Allergies:  ?Allergies  ?Allergen Reactions  ? Ativan [Lorazepam] Other (See Comments)  ? Doxycycline Swelling  ? Prednisone Other (See Comments)  ?  Very emotional, crying, angry  ? Carbamates   ? Penicillin G Other (See Comments)  ? Sulfa Antibiotics Other (See Comments)  ? Penicillins Rash  ?  Has patient had a PCN reaction causing immediate rash, facial/tongue/throat swelling, SOB or lightheadedness with hypotension: yes ?Has patient had a PCN reaction causing severe rash involving mucus membranes or skin necrosis: no ?Has patient had a PCN reaction that required hospitalization: no ?Has patient had a PCN reaction occurring within the last 10 years: no ?If all of the above answers are "NO", then may proceed with Cephalosporin use. ?  ? Sulfonamide Derivatives Rash  ? ? ?Past Medical History, Surgical history, Social history, and Family History were reviewed and updated. ? ?Review of Systems: ?Review of Systems  ?Constitutional:  Positive for fatigue.  ?HENT:  Negative.    ?Eyes: Negative.   ?Respiratory: Negative.    ?Cardiovascular:  Positive for chest pain.  ?Gastrointestinal:  Positive for nausea.  ?Endocrine: Negative.   ?Genitourinary: Negative.    ?Skin: Negative.   ?Neurological:  Positive for extremity weakness.  ?Hematological: Negative.   ?Psychiatric/Behavioral: Negative.    ? ?Physical Exam: ? weight is 49 kg. Her oral temperature is 97.8 ?F (36.6 ?C). Her blood pressure is 130/80 and her pulse is 69. Her respiration is 17 and oxygen saturation is 98%.  ? ?Wt Readings from Last 3 Encounters:  ?09/20/21 49 kg  ?09/17/21 48.8 kg  ?08/30/21 49.5 kg  ? ? ?Physical Exam ?Vitals reviewed.  ?HENT:  ?   Head: Normocephalic and atraumatic.  ?Eyes:  ?   Pupils: Pupils are equal, round, and reactive to light.  ?Cardiovascular:  ?   Rate and Rhythm: Normal rate and regular rhythm.  ?   Heart sounds: Normal heart sounds.  ?Pulmonary:  ?   Effort: Pulmonary effort is normal.  ?   Breath  sounds: Normal breath sounds.  ?Abdominal:  ?   General: Bowel sounds are normal.  ?   Palpations: Abdomen is soft.  ?Musculoskeletal:     ?   General: No tenderness or deformity. Normal range of motion.  ?   Cervical back: Normal range of motion.  ?Lymphadenopathy:  ?   Cervical: No cervical adenopathy.  ?Skin: ?   General: Skin is warm and dry.  ?   Findings: No erythema or rash.  ?Neurological:  ?   Mental Status: She is alert and oriented to person, place, and time.  ?Psychiatric:     ?   Behavior: Behavior normal.     ?   Thought Content: Thought content normal.     ?   Judgment: Judgment normal.  ? ? ? ?  Lab Results  ?Component Value Date  ? WBC 7.8 09/20/2021  ? HGB 10.7 (L) 09/20/2021  ? HCT 33.4 (L) 09/20/2021  ? MCV 94.6 09/20/2021  ? PLT 269 09/20/2021  ? ?  Chemistry   ?   ?Component Value Date/Time  ? NA 140 09/20/2021 1019  ? NA 139 11/24/2019 1527  ? K 3.5 09/20/2021 1019  ? CL 98 09/20/2021 1019  ? CO2 35 (H) 09/20/2021 1019  ? BUN 19 09/20/2021 1019  ? BUN 13 11/24/2019 1527  ? CREATININE 0.58 09/20/2021 1019  ?    ?Component Value Date/Time  ? CALCIUM 9.8 09/20/2021 1019  ? ALKPHOS 59 09/20/2021 1019  ? AST 18 09/20/2021 1019  ? ALT 9 09/20/2021 1019  ? BILITOT 0.4 09/20/2021 1019  ?  ? ? ?Impression and Plan: ?Ms. Fiebelkorn is a very charming 76 year old white female.  I have known her for many years.  She actually got me and my wife together..  She now has a locally advanced squamous cell carcinoma of the left lung.  By the staging studies that we have, looks like this is a stage IIIB tumor. ? ?She is not a candidate for any additional therapy.  We just want to make sure that she has good quality of life.  She is being followed by palliative care.  We did try to get hospice going.  She had hospice initially but then decided that this was too much for her. ? ?We will set up a PET scan for her in a month.  I will then plan to see her back afterwards.  Hopefully, we will see that everything looks  stable. ? ?Her faith remains strong. ? ? ?Volanda Napoleon, MD ?3/17/202311:04 AM  ?

## 2021-09-20 NOTE — Patient Instructions (Signed)

## 2021-09-26 DIAGNOSIS — Z87891 Personal history of nicotine dependence: Secondary | ICD-10-CM | POA: Diagnosis not present

## 2021-09-26 DIAGNOSIS — F32A Depression, unspecified: Secondary | ICD-10-CM | POA: Diagnosis not present

## 2021-09-26 DIAGNOSIS — E440 Moderate protein-calorie malnutrition: Secondary | ICD-10-CM | POA: Diagnosis not present

## 2021-09-26 DIAGNOSIS — D630 Anemia in neoplastic disease: Secondary | ICD-10-CM | POA: Diagnosis not present

## 2021-09-26 DIAGNOSIS — J9601 Acute respiratory failure with hypoxia: Secondary | ICD-10-CM | POA: Diagnosis not present

## 2021-09-26 DIAGNOSIS — J432 Centrilobular emphysema: Secondary | ICD-10-CM | POA: Diagnosis not present

## 2021-09-26 DIAGNOSIS — J84112 Idiopathic pulmonary fibrosis: Secondary | ICD-10-CM | POA: Diagnosis not present

## 2021-09-26 DIAGNOSIS — Z9981 Dependence on supplemental oxygen: Secondary | ICD-10-CM | POA: Diagnosis not present

## 2021-09-26 DIAGNOSIS — M810 Age-related osteoporosis without current pathological fracture: Secondary | ICD-10-CM | POA: Diagnosis not present

## 2021-09-26 DIAGNOSIS — M47814 Spondylosis without myelopathy or radiculopathy, thoracic region: Secondary | ICD-10-CM | POA: Diagnosis not present

## 2021-09-26 DIAGNOSIS — Z9181 History of falling: Secondary | ICD-10-CM | POA: Diagnosis not present

## 2021-09-26 DIAGNOSIS — C3432 Malignant neoplasm of lower lobe, left bronchus or lung: Secondary | ICD-10-CM | POA: Diagnosis not present

## 2021-10-01 ENCOUNTER — Encounter: Payer: Self-pay | Admitting: *Deleted

## 2021-10-01 DIAGNOSIS — Z9181 History of falling: Secondary | ICD-10-CM | POA: Diagnosis not present

## 2021-10-01 DIAGNOSIS — Z87891 Personal history of nicotine dependence: Secondary | ICD-10-CM | POA: Diagnosis not present

## 2021-10-01 DIAGNOSIS — E440 Moderate protein-calorie malnutrition: Secondary | ICD-10-CM | POA: Diagnosis not present

## 2021-10-01 DIAGNOSIS — Z9981 Dependence on supplemental oxygen: Secondary | ICD-10-CM | POA: Diagnosis not present

## 2021-10-01 DIAGNOSIS — C3432 Malignant neoplasm of lower lobe, left bronchus or lung: Secondary | ICD-10-CM | POA: Diagnosis not present

## 2021-10-01 DIAGNOSIS — J9601 Acute respiratory failure with hypoxia: Secondary | ICD-10-CM | POA: Diagnosis not present

## 2021-10-01 DIAGNOSIS — M47814 Spondylosis without myelopathy or radiculopathy, thoracic region: Secondary | ICD-10-CM | POA: Diagnosis not present

## 2021-10-01 DIAGNOSIS — J432 Centrilobular emphysema: Secondary | ICD-10-CM | POA: Diagnosis not present

## 2021-10-01 DIAGNOSIS — D630 Anemia in neoplastic disease: Secondary | ICD-10-CM | POA: Diagnosis not present

## 2021-10-01 DIAGNOSIS — F32A Depression, unspecified: Secondary | ICD-10-CM | POA: Diagnosis not present

## 2021-10-01 DIAGNOSIS — M810 Age-related osteoporosis without current pathological fracture: Secondary | ICD-10-CM | POA: Diagnosis not present

## 2021-10-01 DIAGNOSIS — J84112 Idiopathic pulmonary fibrosis: Secondary | ICD-10-CM | POA: Diagnosis not present

## 2021-10-03 DIAGNOSIS — E440 Moderate protein-calorie malnutrition: Secondary | ICD-10-CM | POA: Diagnosis not present

## 2021-10-03 DIAGNOSIS — Z9981 Dependence on supplemental oxygen: Secondary | ICD-10-CM | POA: Diagnosis not present

## 2021-10-03 DIAGNOSIS — Z9181 History of falling: Secondary | ICD-10-CM | POA: Diagnosis not present

## 2021-10-03 DIAGNOSIS — J9601 Acute respiratory failure with hypoxia: Secondary | ICD-10-CM | POA: Diagnosis not present

## 2021-10-03 DIAGNOSIS — C3432 Malignant neoplasm of lower lobe, left bronchus or lung: Secondary | ICD-10-CM | POA: Diagnosis not present

## 2021-10-03 DIAGNOSIS — J432 Centrilobular emphysema: Secondary | ICD-10-CM | POA: Diagnosis not present

## 2021-10-03 DIAGNOSIS — D630 Anemia in neoplastic disease: Secondary | ICD-10-CM | POA: Diagnosis not present

## 2021-10-03 DIAGNOSIS — M810 Age-related osteoporosis without current pathological fracture: Secondary | ICD-10-CM | POA: Diagnosis not present

## 2021-10-03 DIAGNOSIS — M47814 Spondylosis without myelopathy or radiculopathy, thoracic region: Secondary | ICD-10-CM | POA: Diagnosis not present

## 2021-10-03 DIAGNOSIS — Z87891 Personal history of nicotine dependence: Secondary | ICD-10-CM | POA: Diagnosis not present

## 2021-10-03 DIAGNOSIS — J84112 Idiopathic pulmonary fibrosis: Secondary | ICD-10-CM | POA: Diagnosis not present

## 2021-10-03 DIAGNOSIS — F32A Depression, unspecified: Secondary | ICD-10-CM | POA: Diagnosis not present

## 2021-10-07 ENCOUNTER — Other Ambulatory Visit: Payer: Medicare Other

## 2021-10-07 ENCOUNTER — Ambulatory Visit: Payer: Medicare Other | Admitting: Hematology and Oncology

## 2021-10-09 DIAGNOSIS — C3432 Malignant neoplasm of lower lobe, left bronchus or lung: Secondary | ICD-10-CM | POA: Diagnosis not present

## 2021-10-09 DIAGNOSIS — Z9981 Dependence on supplemental oxygen: Secondary | ICD-10-CM | POA: Diagnosis not present

## 2021-10-09 DIAGNOSIS — J84112 Idiopathic pulmonary fibrosis: Secondary | ICD-10-CM | POA: Diagnosis not present

## 2021-10-09 DIAGNOSIS — J432 Centrilobular emphysema: Secondary | ICD-10-CM | POA: Diagnosis not present

## 2021-10-09 DIAGNOSIS — F32A Depression, unspecified: Secondary | ICD-10-CM | POA: Diagnosis not present

## 2021-10-09 DIAGNOSIS — M47814 Spondylosis without myelopathy or radiculopathy, thoracic region: Secondary | ICD-10-CM | POA: Diagnosis not present

## 2021-10-09 DIAGNOSIS — M810 Age-related osteoporosis without current pathological fracture: Secondary | ICD-10-CM | POA: Diagnosis not present

## 2021-10-09 DIAGNOSIS — Z87891 Personal history of nicotine dependence: Secondary | ICD-10-CM | POA: Diagnosis not present

## 2021-10-09 DIAGNOSIS — Z9181 History of falling: Secondary | ICD-10-CM | POA: Diagnosis not present

## 2021-10-09 DIAGNOSIS — E440 Moderate protein-calorie malnutrition: Secondary | ICD-10-CM | POA: Diagnosis not present

## 2021-10-09 DIAGNOSIS — J9601 Acute respiratory failure with hypoxia: Secondary | ICD-10-CM | POA: Diagnosis not present

## 2021-10-09 DIAGNOSIS — D630 Anemia in neoplastic disease: Secondary | ICD-10-CM | POA: Diagnosis not present

## 2021-10-11 ENCOUNTER — Telehealth: Payer: Self-pay | Admitting: Internal Medicine

## 2021-10-11 NOTE — Telephone Encounter (Signed)
Called and left voicemail for patient per request of results from Dr Guadalupe Dawn. Nothing further needed at this time. I told patient to call office back if she had any questions or concerns  ?

## 2021-10-11 NOTE — Telephone Encounter (Signed)
Called patient and she would like the chest xray results that was preformed. I advised patietn that I would message the docotr and get those results to her ? ?Dr Guadalupe Dawn please advise  ?

## 2021-10-11 NOTE — Telephone Encounter (Signed)
?  Apologeize for delay but overall just the known issus for mass and IPF. DR Marin Olp is planning PET scan and she would get more details on that ? ? ?Narrative & Impression  ?CLINICAL DATA:  Idiopathic pulmonary fibrosis. ?  ?EXAM: ?CHEST - 2 VIEW ?  ?COMPARISON:  July 25, 2021 ?  ?FINDINGS: ?There is stable right-sided venous Port-A-Cath positioning. Stable ?marked severity, diffuse, reticular opacities are seen throughout ?both lungs. Stable ill-defined patchy opacities are again seen along ?the periphery of the mid lung fields, right greater than left. A ?stable appearing rounded opacity is seen within the left lung base ?which corresponds to the area of the patient's known tumor. There is ?no evidence of a pleural effusion or pneumothorax. The heart size ?and mediastinal contours are within normal limits. There is mild ?calcification of the aortic arch. The visualized skeletal structures ?are unremarkable. ?  ?IMPRESSION: ?1. Findings consistent with the patient's history of anterior left ?lower lobe lung mass and idiopathic pulmonary fibrosis. Sequelae ?associated with superimposed infiltrates cannot be excluded. ?  ?  ?Electronically Signed ?  By: Virgina Norfolk M.D. ?  On: 09/02/2021 21:24  ? ?

## 2021-10-17 DIAGNOSIS — Z9981 Dependence on supplemental oxygen: Secondary | ICD-10-CM | POA: Diagnosis not present

## 2021-10-17 DIAGNOSIS — M810 Age-related osteoporosis without current pathological fracture: Secondary | ICD-10-CM | POA: Diagnosis not present

## 2021-10-17 DIAGNOSIS — J432 Centrilobular emphysema: Secondary | ICD-10-CM | POA: Diagnosis not present

## 2021-10-17 DIAGNOSIS — Z87891 Personal history of nicotine dependence: Secondary | ICD-10-CM | POA: Diagnosis not present

## 2021-10-17 DIAGNOSIS — M47814 Spondylosis without myelopathy or radiculopathy, thoracic region: Secondary | ICD-10-CM | POA: Diagnosis not present

## 2021-10-17 DIAGNOSIS — C3432 Malignant neoplasm of lower lobe, left bronchus or lung: Secondary | ICD-10-CM | POA: Diagnosis not present

## 2021-10-17 DIAGNOSIS — Z9181 History of falling: Secondary | ICD-10-CM | POA: Diagnosis not present

## 2021-10-17 DIAGNOSIS — J84112 Idiopathic pulmonary fibrosis: Secondary | ICD-10-CM | POA: Diagnosis not present

## 2021-10-17 DIAGNOSIS — F32A Depression, unspecified: Secondary | ICD-10-CM | POA: Diagnosis not present

## 2021-10-17 DIAGNOSIS — D630 Anemia in neoplastic disease: Secondary | ICD-10-CM | POA: Diagnosis not present

## 2021-10-17 DIAGNOSIS — J9601 Acute respiratory failure with hypoxia: Secondary | ICD-10-CM | POA: Diagnosis not present

## 2021-10-17 DIAGNOSIS — E440 Moderate protein-calorie malnutrition: Secondary | ICD-10-CM | POA: Diagnosis not present

## 2021-10-18 ENCOUNTER — Ambulatory Visit (HOSPITAL_COMMUNITY)
Admission: RE | Admit: 2021-10-18 | Discharge: 2021-10-18 | Disposition: A | Discharge status: Home / Self Care | Payer: Medicare Other | Source: Ambulatory Visit | Attending: Hematology & Oncology | Admitting: Hematology & Oncology

## 2021-10-18 DIAGNOSIS — C3430 Malignant neoplasm of lower lobe, unspecified bronchus or lung: Secondary | ICD-10-CM | POA: Diagnosis not present

## 2021-10-18 DIAGNOSIS — C3490 Malignant neoplasm of unspecified part of unspecified bronchus or lung: Secondary | ICD-10-CM | POA: Diagnosis not present

## 2021-10-18 DIAGNOSIS — J8410 Pulmonary fibrosis, unspecified: Secondary | ICD-10-CM | POA: Insufficient documentation

## 2021-10-18 DIAGNOSIS — J984 Other disorders of lung: Secondary | ICD-10-CM | POA: Diagnosis not present

## 2021-10-18 DIAGNOSIS — I2510 Atherosclerotic heart disease of native coronary artery without angina pectoris: Secondary | ICD-10-CM | POA: Diagnosis not present

## 2021-10-18 LAB — GLUCOSE, CAPILLARY: Glucose-Capillary: 94 mg/dL (ref 70–99)

## 2021-10-18 MED ORDER — FLUDEOXYGLUCOSE F - 18 (FDG) INJECTION
5.6000 | Freq: Once | INTRAVENOUS | Status: AC
  Administered 2021-10-18: 5.6 via INTRAVENOUS

## 2021-10-22 ENCOUNTER — Other Ambulatory Visit: Payer: Medicare Other | Admitting: Internal Medicine

## 2021-10-22 ENCOUNTER — Encounter: Payer: Self-pay | Admitting: Internal Medicine

## 2021-10-22 VITALS — HR 66 | Wt 106.4 lb

## 2021-10-22 DIAGNOSIS — R5381 Other malaise: Secondary | ICD-10-CM

## 2021-10-22 DIAGNOSIS — C3432 Malignant neoplasm of lower lobe, left bronchus or lung: Secondary | ICD-10-CM

## 2021-10-22 DIAGNOSIS — J9611 Chronic respiratory failure with hypoxia: Secondary | ICD-10-CM | POA: Diagnosis not present

## 2021-10-22 DIAGNOSIS — J84112 Idiopathic pulmonary fibrosis: Secondary | ICD-10-CM | POA: Diagnosis not present

## 2021-10-22 DIAGNOSIS — Z515 Encounter for palliative care: Secondary | ICD-10-CM

## 2021-10-22 DIAGNOSIS — E440 Moderate protein-calorie malnutrition: Secondary | ICD-10-CM

## 2021-10-22 NOTE — Progress Notes (Signed)
[facility] Follow-Up Visit Telephone: [phone]  Fax: [phone]   Date of encounter: 10/22/21 9:45 AM PATIENT NAME: Molly Welch [place] [address]   [phone] (home)  DOB: 1946/01/22 MRN: [id number] PRIMARY CARE PROVIDER:    Carolee Rota, [place],  [place] [address] [phone]  REFERRING PROVIDER:   Carolee Rota, NP [address]  [place] [address] [phone]  RESPONSIBLE PARTY:    Contact Information     Name Relation Home Work Mobile   [place],Jennifer Daughter [phone]  [phone]        I met face to face with patient and family in her home. Palliative Care was asked to follow this patient by consultation request of  Carolee Rota, NP to address advance care planning and complex medical decision making. This is follow-up visit.                                     ASSESSMENT AND PLAN / RECOMMENDATIONS:   Advance Care Planning/Goals of Care: Goals include to maximize quality of life and symptom management. Patient/health care surrogate gave his/her permission to discuss.Our advance care planning conversation included a discussion about:    The value and importance of advance care planning  Experiences with loved ones who have been seriously ill or have died  Exploration of personal, cultural or spiritual beliefs that might influence medical decisions  Exploration of goals of care in the event of a sudden injury or illness  Identification  of a healthcare agent --dtr [name] Review and updating or creation of an  advance directive document . Decision not to resuscitate or to de-escalate disease focused treatments due to poor prognosis. CODE STATUS:  DNR, MOST completed last time Goals at present are to stay home and go directly from her current home to her final home (she said today) Her daughter wants to get her to the beach but patient expressed that she does not  have the strength for this and I supported patient's wish not to go at this time due to her fatigue, sob and just being overwhelmed with the whole process--going out to an appt is too much  Pt does desire to continue her PT and enjoys going outdoors when it's nice Is not yet ready for hospice care but she will certainly let us know when she is  Reminded her and [name] to call if she develops new or uncontrolled symptoms and we will certainly address them  Symptom Management/Plan: 1. Malignant neoplasm of lower lobe of left lung ([place]) -has unfortunately grown on PET scan, but symptoms only minimally changed since last visit in March--slight increase in use of albuterol and pain medication for upper abdominal pain -doing more though with therapy now than before -slight drop in weight -pt not quite ready for hospice but reeducated about this option when she is  2. Malnutrition of moderate degree -continue frequent snacks, high calorie choices as tolerated -sounds like loose stools have improved--more mushy now and usually 2, sometimes 3 per day (pain med helping this)  3. Debility -continue PT per pt wish as long as making progress which she has to permit recertification -wants to try to maintain and build strength as she is able and has the energy to do  4. Chronic respiratory failure with hypoxia (HCC) -cont 3-4L O2 via [place], same nebs, inhalers and  monitor -f/u with pulmonary as planned  5. IPF (idiopathic pulmonary fibrosis) ([place]) -continue current conservative tx and monitor -is severe underlying her cancer  6. Palliative care by specialist -goals reviewed as above -reviewed hospice option again today -cont to monitor and reassess 12/12/2021 And prn    Follow up Palliative Care Visit: Palliative care will continue to follow for complex medical decision making, advance care planning, and clarification of goals.   This visit was coded based on medical decision making (MDM).  22  minutes on ACP.  PPS: 40%  HOSPICE ELIGIBILITY/DIAGNOSIS: Yes, but not ready/Lung cancer  Chief Complaint: Follow-up palliative visit  HISTORY OF PRESENT ILLNESS:  Molly Welch is a 76 y.o. year old female  with lung cancer, pulmonary fibrosis, frailty, MGUS, respiratory failure among others seen in palliative care follow-up at home with her daughter [name].   PET shows enlargement from 4.1x4.4cm 2/7 and now 5x4.6cm 4/14 plus her severe pulmonary fibrosis.  Having pain across her upper abdomen.  2.5x per day for pain medication.  Bones also ache.  She does not like to take much pain medication and [name] believes she has a high tolerance.    Using inhaler a little more often--about twice a week when she wakes up--will have cough and tightness.  Pepcid has helped a little with her abdominal pain.    BMs have been pretty normal lately.  She still has mushy stools.  Will get up early like 5am to have bm and then will have to go again at 7:30am.  She might have a 3rd.  This has improved with pain medication being added.    Returns to pulmonary next week.   History obtained from review of EMR, discussion with primary team, and interview with family, facility staff/caregiver and/or Ms. Moro.  I reviewed available labs, medications, imaging, studies and related documents from the EMR.  Records reviewed and summarized above.   ROS  General: NAD EYES: denies vision changes ENMT: denies dysphagia Cardiovascular: denies chest pain--will get tight across chest when sob, has DOE Pulmonary: has cough, no increased SOB vs last visit Abdomen: endorses poor appetite but will eat about 50%, denies constipation, endorses continence of bowel GU: denies dysuria, endorses incontinence of urine at times MSK:  denies increased weakness vs last time,  no falls reported Skin: denies rashes or wounds Neurological: has upper abdominal pain, denies insomnia Psych: Endorses positive mood, tearful  about future Heme/lymph/immuno: denies bruises, abnormal bleeding  Physical Exam:   Today's Vitals   10/22/21 0955  Pulse: 66  SpO2: 95%  Weight: 106 lb 6.4 oz (48.3 kg)   Body mass index is 19.46 kg/m.  Current and past weights:  107 last visit 3/14, 118 lbs 07/08/21 Constitutional: NAD General: frail appearing, thin, pale EYES: anicteric sclera, lids intact, no discharge  ENMT: intact hearing, oral mucous membranes moist CV: S1S2, RRR, no LE edema Pulmonary: coarse rhonchi,  increased work of breathing with prolonged speech, dry cough, 4L O2 Abdomen: intake 50% of a meals on wheels meal at a time, also has pop tarts for breakfast, drinks propel water, cannot tolerate shakes due to diarrhea, normo-active BS + 4 quadrants, soft and non tender, no ascites GU: deferred MSK: has sarcopenia, moves all extremities, ambulatory with walker Skin: warm and dry, no rashes or wounds on visible skin Neuro:  has generalized weakness,  no cognitive impairment Psych: non-anxious affect, A and O x 3, became tearful discussing PET results Hem/lymph/immuno: no widespread bruising  CURRENT  PROBLEM LIST:  Patient Active Problem List   Diagnosis Date Noted   Chronic respiratory failure with hypoxia ([facility]) 07/25/2021   IPF (idiopathic pulmonary fibrosis) (HCC)     Class: Chronic   ILD (interstitial lung disease) ([place])    Malnutrition of moderate degree 07/04/2021   Septic shock ([place]) 07/03/2021   Acute respiratory failure with hypoxemia ([place]) 07/03/2021   Lactic acidosis 07/03/2021   Hyponatremia 07/03/2021   Leukocytosis 07/03/2021   Anemia of chronic disease 07/03/2021   Abnormal chest x-ray 07/03/2021   Acute hypoxemic respiratory failure due to COVID-19 [facility]) 07/03/2021   Goals of care, counseling/discussion 04/19/2021   Lung cancer, lower lobe ([facility]) 04/19/2021   Mass of left lung    Recurrent falls 03/24/2021   CAP (community acquired pneumonia) 03/23/2021   Vertigo 03/23/2021   MGUS  (monoclonal gammopathy of unknown significance) 03/23/2021   Tobacco abuse 03/23/2021   Debility 03/23/2021   Intractable nausea and vomiting 03/23/2021   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   Tremor of unknown origin 10/18/2020   Anxiety    CONTACT DERMATITIS&OTHER [facility] DUE TO PLANTS 08/17/2010   PAST MEDICAL HISTORY:  Active Ambulatory Problems    Diagnosis Date Noted   CONTACT DERMATITIS&OTHER ECZEMA DUE TO PLANTS 08/17/2010   Anxiety    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   Tremor of unknown origin 10/18/2020   CAP (community acquired pneumonia) 03/23/2021   Vertigo 03/23/2021   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   Tobacco abuse 03/23/2021   Debility 03/23/2021   Intractable nausea and vomiting 03/23/2021   Recurrent falls 03/24/2021   Mass of left lung    Goals of care, counseling/discussion 04/19/2021   Lung cancer, lower lobe ([place]) 04/19/2021   Septic shock ([facility]) 07/03/2021   Acute respiratory failure with hypoxemia (HCC) 07/03/2021   Lactic acidosis 07/03/2021   Hyponatremia 07/03/2021   Leukocytosis 07/03/2021   Anemia of chronic disease 07/03/2021   Abnormal chest x-ray 07/03/2021   Acute hypoxemic respiratory failure due to COVID-19 ([facility]) 07/03/2021   Malnutrition of moderate degree 07/04/2021   ILD (interstitial lung disease) ([place])    IPF (idiopathic pulmonary fibrosis) (HCC)    Chronic respiratory failure with hypoxia ([place]) 07/25/2021   Resolved Ambulatory Problems    Diagnosis Date Noted   No Resolved Ambulatory Problems   Past Medical History:  Diagnosis Date   Anxiety and depression    Depression    Tobacco dependence    SOCIAL HX:  Social History   Tobacco Use   Smoking status: Former    Packs/day: 0.50    Years: 55.00    Pack years: 27.50    Types: Cigarettes    Quit date: 03/07/2021    Years since quitting: 0.6   Smokeless tobacco: Never  Substance  Use Topics   Alcohol use: No     ALLERGIES:  Allergies  Allergen Reactions   Ativan [Lorazepam] Other (See Comments)   Doxycycline Swelling   Prednisone Other (See Comments)    Very emotional, crying, angry   Carbamates    Penicillin G Other (See Comments)   Sulfa Antibiotics Other (See Comments)   Penicillins Rash    Has patient had a PCN reaction causing immediate rash, facial/tongue/throat swelling, SOB or lightheadedness with hypotension: yes Has patient had a PCN reaction causing severe rash involving mucus membranes or skin necrosis: no Has patient had a PCN reaction that required hospitalization: no Has patient had a PCN reaction occurring within  the last 10 years: no If all of the above answers are "NO", then may proceed with Cephalosporin use.    Sulfonamide Derivatives Rash     PERTINENT MEDICATIONS:  Outpatient Encounter Medications as of 10/22/2021  Medication Sig   acetaminophen (TYLENOL) 500 MG tablet Take 500 mg by mouth daily as needed for headache (pain).   albuterol (VENTOLIN HFA) 108 (90 Base) MCG/ACT inhaler Inhale 1-2 puffs into the lungs every 6 (six) hours as needed.   ALPRAZolam (XANAX) 1 MG tablet Take 0.5-1 mg by mouth See admin instructions. Take one tablet (1 mg) by mouth at 5:30am, take 1/2 tablet (0.5 mg) at 7:30am, 3pm and at bedtime.   AMBULATORY NON FORMULARY MEDICATION Lift Chair Diagnosis   AMBULATORY NON FORMULARY MEDICATION Rollator Diagnosis G 62.89   Ascorbic Acid (VITAMIN C) 1000 MG tablet Take 1,000 mg by mouth every morning.   azelastine (OPTIVAR) 0.05 % ophthalmic solution Place 1 drop into both eyes 2 (two) times daily as needed (dry eyes/itching).   diclofenac Sodium (VOLTAREN) 1 % GEL Apply 1 application topically at bedtime.   diphenoxylate-atropine (LOMOTIL) 2.5-0.025 MG tablet Take 1 tablet by mouth 4 (four) times daily as needed for diarrhea or loose stools.   feeding supplement (ENSURE ENLIVE / ENSURE PLUS) LIQD Take 237 mLs by  mouth 3 (three) times daily between meals.   HYDROcodone-acetaminophen (NORCO/VICODIN) 5-325 MG tablet Take 1 tablet by mouth every 4 (four) hours as needed for moderate pain.   hypromellose (SYSTANE OVERNIGHT THERAPY) 0.3 % GEL ophthalmic ointment Place 1 application into both eyes at bedtime as needed for dry eyes.   lamoTRIgine (LAMICTAL) 100 MG tablet Take 100 mg by mouth at bedtime.   lamoTRIgine (LAMICTAL) 25 MG tablet Take 100 mg by mouth every morning.   lipase/protease/amylase (CREON) 36000 UNITS CPEP capsule Take 36,000 Units by mouth See admin instructions. Take two capsules (72000 units) by mouth three times daily with meals and one capsule (36000 units) with snacks   Multiple Vitamin (MULTIVITAMIN WITH MINERALS) TABS tablet Take 1 tablet by mouth every morning. Centrum   Probiotic Product (PROBIOTIC PO) Take 1 tablet by mouth every morning.   prochlorperazine (COMPAZINE) 10 MG tablet Take 1 tablet (10 mg total) by mouth every 6 (six) hours as needed (Nausea or vomiting).   TURMERIC PO Take 1 capsule by mouth daily.   valACYclovir (VALTREX) 500 MG tablet Take 500 mg by mouth every morning.   Facility-Administered Encounter Medications as of 10/22/2021  Medication   sodium chloride flush (NS) 0.9 % injection 10 mL   Thank you for the opportunity to participate in the care of Ms. Costanza.  The palliative care team will continue to follow. Please call our office at [phone] if we can be of additional assistance.   Hollace Kinnier, DO  COVID-19 PATIENT SCREENING TOOL Asked and negative response unless otherwise noted:  Have you had symptoms of covid, tested positive or been in contact with someone with symptoms/positive test in the past 5-10 days? no

## 2021-10-24 ENCOUNTER — Inpatient Hospital Stay: Payer: Medicare Other

## 2021-10-24 ENCOUNTER — Other Ambulatory Visit: Payer: Self-pay

## 2021-10-24 ENCOUNTER — Inpatient Hospital Stay (HOSPITAL_BASED_OUTPATIENT_CLINIC_OR_DEPARTMENT_OTHER): Payer: Medicare Other | Admitting: Hematology & Oncology

## 2021-10-24 ENCOUNTER — Encounter: Payer: Self-pay | Admitting: Hematology & Oncology

## 2021-10-24 ENCOUNTER — Inpatient Hospital Stay: Payer: Medicare Other | Attending: Hematology and Oncology

## 2021-10-24 VITALS — BP 128/67 | HR 68 | Temp 97.8°F | Resp 20 | Wt 105.0 lb

## 2021-10-24 DIAGNOSIS — C3432 Malignant neoplasm of lower lobe, left bronchus or lung: Secondary | ICD-10-CM | POA: Diagnosis not present

## 2021-10-24 DIAGNOSIS — Z95828 Presence of other vascular implants and grafts: Secondary | ICD-10-CM

## 2021-10-24 DIAGNOSIS — Z79899 Other long term (current) drug therapy: Secondary | ICD-10-CM | POA: Diagnosis not present

## 2021-10-24 DIAGNOSIS — R590 Localized enlarged lymph nodes: Secondary | ICD-10-CM | POA: Diagnosis not present

## 2021-10-24 DIAGNOSIS — R197 Diarrhea, unspecified: Secondary | ICD-10-CM | POA: Diagnosis not present

## 2021-10-24 DIAGNOSIS — C3430 Malignant neoplasm of lower lobe, unspecified bronchus or lung: Secondary | ICD-10-CM

## 2021-10-24 LAB — CMP (CANCER CENTER ONLY)
ALT: 10 U/L (ref 0–44)
AST: 17 U/L (ref 15–41)
Albumin: 4.2 g/dL (ref 3.5–5.0)
Alkaline Phosphatase: 65 U/L (ref 38–126)
Anion gap: 5 (ref 5–15)
BUN: 22 mg/dL (ref 8–23)
CO2: 32 mmol/L (ref 22–32)
Calcium: 9.9 mg/dL (ref 8.9–10.3)
Chloride: 99 mmol/L (ref 98–111)
Creatinine: 0.64 mg/dL (ref 0.44–1.00)
GFR, Estimated: >60 mL/min (ref >60)
Glucose, Bld: 112 mg/dL — ABNORMAL HIGH (ref 70–99)
Potassium: 4 mmol/L (ref 3.5–5.1)
Sodium: 136 mmol/L (ref 135–145)
Total Bilirubin: 0.3 mg/dL (ref 0.3–1.2)
Total Protein: 7.6 g/dL (ref 6.5–8.1)

## 2021-10-24 LAB — CBC WITH DIFFERENTIAL (CANCER CENTER ONLY)
Abs Immature Granulocytes: 0.02 10*3/uL (ref 0.00–0.07)
Basophils Absolute: 0 10*3/uL (ref 0.0–0.1)
Basophils Relative: 1 %
Eosinophils Absolute: 0.1 10*3/uL (ref 0.0–0.5)
Eosinophils Relative: 1 %
HCT: 35.3 % — ABNORMAL LOW (ref 36.0–46.0)
Hemoglobin: 11.4 g/dL — ABNORMAL LOW (ref 12.0–15.0)
Immature Granulocytes: 0 %
Lymphocytes Relative: 30 %
Lymphs Abs: 2.6 10*3/uL (ref 0.7–4.0)
MCH: 30.2 pg (ref 26.0–34.0)
MCHC: 32.3 g/dL (ref 30.0–36.0)
MCV: 93.6 fL (ref 80.0–100.0)
Monocytes Absolute: 0.8 10*3/uL (ref 0.1–1.0)
Monocytes Relative: 9 %
Neutro Abs: 5 10*3/uL (ref 1.7–7.7)
Neutrophils Relative %: 59 %
Platelet Count: 282 10*3/uL (ref 150–400)
RBC: 3.77 MIL/uL — ABNORMAL LOW (ref 3.87–5.11)
RDW: 12.8 % (ref 11.5–15.5)
WBC Count: 8.6 10*3/uL (ref 4.0–10.5)
nRBC: 0 % (ref 0.0–0.2)

## 2021-10-24 LAB — LACTATE DEHYDROGENASE: LDH: 171 U/L (ref 98–192)

## 2021-10-24 LAB — PREALBUMIN: Prealbumin: 23.1 mg/dL (ref 18–38)

## 2021-10-24 MED ORDER — HEPARIN SOD (PORK) LOCK FLUSH 100 UNIT/ML IV SOLN: 500.0000 [IU] | Freq: Once | INTRAVENOUS | Status: DC

## 2021-10-24 MED ORDER — SODIUM CHLORIDE 0.9% FLUSH: 10.0000 mL | Freq: Once | INTRAVENOUS | Status: DC

## 2021-10-24 NOTE — Progress Notes (Signed)
?Hematology and Oncology Follow Up Visit ? ?Molly Welch ?[id number] ?Oct 16, 1945 76 y.o. ?10/24/2021 ? ? 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 ?Current Therapy:   ?Carbo/Taxol/Yervoy/Opdivo --s/p cycle #4 on --started 04/26/2021 -- d/c on 07/29/2021 ?    ?Interim History:  Molly Welch is back for follow-up.  Actually, I think she is doing about as best as I would expect.  I am very impressed with her resilience.  She is trying her best.  I think the biggest problem is her weight.  Her weight is holding steady right now.  It is 105 pounds. ? ?We did do a PET scan on her.  This was done last week.  The PET scan did show that the tumor was growing but only in the left lower lung.  The tumor was now 5 x 4.6 cm.  An SUV of 12.  There is some stable enlarged lymph nodes in the mediastinum and hilum but there is no hypermetabolism with them. ? ?I told her, and her daughter that I thought that this was very good news.  I think as long as we do not see any spread of her cancer, I think she should manage. ? ?She is on oxygen.  She has bad underlying interstitial pneumonitis and possible fibrosis. ? ?She is trying to eat.  She does have some problems with milk products.  She has some diarrhea. ? ?She has had no problems with pain.  She does take Vicodin on occasion. ? ?She does have some anxiety issues.  She is worried about her finances.  Again, we prayed about this. ? ?I know that Palliative Care is following her right now.  She just is not ready to consider hospice. ? ?Currently, I would say performance status is probably ECOG 3.   ? ?Medications:  ?Current Outpatient Medications:  ?  acetaminophen (TYLENOL) 500 MG tablet, Take 500 mg by mouth daily as needed for headache (pain)., Disp: , Rfl:  ?  albuterol (VENTOLIN HFA) 108 (90 Base) MCG/ACT inhaler, Inhale 1-2 puffs into the lungs every 6 (six) hours as needed., Disp: 8 g, Rfl: 2 ?   ALPRAZolam (XANAX) 1 MG tablet, Take 0.5-1 mg by mouth See admin instructions. Take one tablet (1 mg) by mouth at 5:30am, take 1/2 tablet (0.5 mg) at 7:30am, 3pm and at bedtime., Disp: , Rfl:  ?  AMBULATORY NON FORMULARY MEDICATION, Lift Chair Diagnosis, Disp: 1 Device, Rfl: 0 ?  AMBULATORY NON FORMULARY MEDICATION, Rollator Diagnosis G 62.89, Disp: 1 Device, Rfl: 0 ?  Ascorbic Acid (VITAMIN C) 1000 MG tablet, Take 1,000 mg by mouth every morning., Disp: , Rfl:  ?  azelastine (OPTIVAR) 0.05 % ophthalmic solution, Place 1 drop into both eyes 2 (two) times daily as needed (dry eyes/itching)., Disp: , Rfl:  ?  diclofenac Sodium (VOLTAREN) 1 % GEL, Apply 1 application topically at bedtime., Disp: , Rfl:  ?  diphenoxylate-atropine (LOMOTIL) 2.5-0.025 MG tablet, Take 1 tablet by mouth 4 (four) times daily as needed for diarrhea or loose stools., Disp: 100 tablet, Rfl: 0 ?  feeding supplement (ENSURE ENLIVE / ENSURE PLUS) LIQD, Take 237 mLs by mouth 3 (three) times daily between meals., Disp: , Rfl:  ?  HYDROcodone-acetaminophen (NORCO/VICODIN) 5-325 MG tablet, Take 1 tablet by mouth every 4 (four) hours as needed for moderate pain., Disp: 60 tablet, Rfl: 0 ?  hypromellose (SYSTANE OVERNIGHT THERAPY) 0.3 % GEL ophthalmic ointment, Place  1 application into both eyes at bedtime as needed for dry eyes., Disp: , Rfl:  ?  lamoTRIgine (LAMICTAL) 100 MG tablet, Take 100 mg by mouth at bedtime., Disp: , Rfl:  ?  lamoTRIgine (LAMICTAL) 25 MG tablet, Take 100 mg by mouth every morning., Disp: , Rfl:  ?  lipase/protease/amylase (CREON) 36000 UNITS CPEP capsule, Take 36,000 Units by mouth See admin instructions. Take two capsules (72000 units) by mouth three times daily with meals and one capsule (36000 units) with snacks, Disp: , Rfl:  ?  Multiple Vitamin (MULTIVITAMIN WITH MINERALS) TABS tablet, Take 1 tablet by mouth every morning. Centrum, Disp: , Rfl:  ?  Probiotic Product (PROBIOTIC PO), Take 1 tablet by mouth every morning.,  Disp: , Rfl:  ?  prochlorperazine (COMPAZINE) 10 MG tablet, Take 1 tablet (10 mg total) by mouth every 6 (six) hours as needed (Nausea or vomiting)., Disp: 30 tablet, Rfl: 1 ?  TURMERIC PO, Take 1 capsule by mouth daily., Disp: , Rfl:  ?  valACYclovir (VALTREX) 500 MG tablet, Take 500 mg by mouth every morning., Disp: , Rfl:  ?No current facility-administered medications for this visit. ? ?Facility-Administered Medications Ordered in Other Visits:  ?  heparin lock flush 100 unit/mL, 500 Units, Intravenous, Once, Zephaniah Lubrano, Rudell Cobb, MD ?  sodium chloride flush (NS) 0.9 % injection 10 mL, 10 mL, Intravenous, PRN, Volanda Napoleon, MD, 10 mL at 08/23/21 1127 ?  sodium chloride flush (NS) 0.9 % injection 10 mL, 10 mL, Intravenous, Once, Rosalba Totty, Rudell Cobb, MD ? ?Allergies:  ?Allergies  ?Allergen Reactions  ? Ativan [Lorazepam] Other (See Comments)  ? Doxycycline Swelling  ? Prednisone Other (See Comments)  ?  Very emotional, crying, angry  ? Carbamates   ? Penicillin G Other (See Comments)  ? Sulfa Antibiotics Other (See Comments)  ? Penicillins Rash  ?  Has patient had a PCN reaction causing immediate rash, facial/tongue/throat swelling, SOB or lightheadedness with hypotension: yes ?Has patient had a PCN reaction causing severe rash involving mucus membranes or skin necrosis: no ?Has patient had a PCN reaction that required hospitalization: no ?Has patient had a PCN reaction occurring within the last 10 years: no ?If all of the above answers are "NO", then may proceed with Cephalosporin use. ?  ? Sulfonamide Derivatives Rash  ? ? ?Past Medical History, Surgical history, Social history, and Family History were reviewed and updated. ? ?Review of Systems: ?Review of Systems  ?Constitutional:  Positive for fatigue.  ?HENT:  Negative.    ?Eyes: Negative.   ?Respiratory: Negative.    ?Cardiovascular:  Positive for chest pain.  ?Gastrointestinal:  Positive for nausea.  ?Endocrine: Negative.   ?Genitourinary: Negative.    ?Skin:  Negative.   ?Neurological:  Positive for extremity weakness.  ?Hematological: Negative.   ?Psychiatric/Behavioral: Negative.    ? ?Physical Exam: ? weight is 105 lb (47.6 kg). Her oral temperature is 97.8 ?F (36.6 ?C). Her blood pressure is 128/67 and her pulse is 68. Her respiration is 20 and oxygen saturation is 97%.  ? ?Wt Readings from Last 3 Encounters:  ?10/24/21 105 lb (47.6 kg)  ?10/22/21 106 lb 6.4 oz (48.3 kg)  ?09/20/21 108 lb 0.6 oz (49 kg)  ? ? ?Physical Exam ?Vitals reviewed.  ?HENT:  ?   Head: Normocephalic and atraumatic.  ?Eyes:  ?   Pupils: Pupils are equal, round, and reactive to light.  ?Cardiovascular:  ?   Rate and Rhythm: Normal rate and regular rhythm.  ?  Heart sounds: Normal heart sounds.  ?Pulmonary:  ?   Effort: Pulmonary effort is normal.  ?   Breath sounds: Normal breath sounds.  ?Abdominal:  ?   General: Bowel sounds are normal.  ?   Palpations: Abdomen is soft.  ?Musculoskeletal:     ?   General: No tenderness or deformity. Normal range of motion.  ?   Cervical back: Normal range of motion.  ?Lymphadenopathy:  ?   Cervical: No cervical adenopathy.  ?Skin: ?   General: Skin is warm and dry.  ?   Findings: No erythema or rash.  ?Neurological:  ?   Mental Status: She is alert and oriented to person, place, and time.  ?Psychiatric:     ?   Behavior: Behavior normal.     ?   Thought Content: Thought content normal.     ?   Judgment: Judgment normal.  ? ? ? ?Lab Results  ?Component Value Date  ? WBC 8.6 10/24/2021  ? HGB 11.4 (L) 10/24/2021  ? HCT 35.3 (L) 10/24/2021  ? MCV 93.6 10/24/2021  ? PLT 282 10/24/2021  ? ?  Chemistry   ?   ?Component Value Date/Time  ? NA 140 09/20/2021 1019  ? NA 139 11/24/2019 1527  ? K 3.5 09/20/2021 1019  ? CL 98 09/20/2021 1019  ? CO2 35 (H) 09/20/2021 1019  ? BUN 19 09/20/2021 1019  ? BUN 13 11/24/2019 1527  ? CREATININE 0.58 09/20/2021 1019  ?    ?Component Value Date/Time  ? CALCIUM 9.8 09/20/2021 1019  ? ALKPHOS 59 09/20/2021 1019  ? AST 18 09/20/2021  1019  ? ALT 9 09/20/2021 1019  ? BILITOT 0.4 09/20/2021 1019  ?  ? ? ?Impression and Plan: ?Molly Welch is a very charming 76 year old white female.  I have known her for many years.  She actually got me

## 2021-10-24 NOTE — Patient Instructions (Signed)

## 2021-10-25 ENCOUNTER — Other Ambulatory Visit: Payer: Self-pay | Admitting: Hematology & Oncology

## 2021-10-25 DIAGNOSIS — C3431 Malignant neoplasm of lower lobe, right bronchus or lung: Secondary | ICD-10-CM

## 2021-10-25 DIAGNOSIS — R197 Diarrhea, unspecified: Secondary | ICD-10-CM

## 2021-10-28 ENCOUNTER — Ambulatory Visit: Payer: Medicare Other | Admitting: Nurse Practitioner

## 2021-10-28 MED ORDER — DIPHENOXYLATE-ATROPINE 2.5-0.025 MG PO TABS: 1.0000 | ORAL_TABLET | Freq: Four times a day (QID) | ORAL | 0 refills | Status: AC | PRN

## 2021-10-28 MED ORDER — HYDROCODONE-ACETAMINOPHEN 5-325 MG PO TABS: 1.0000 | ORAL_TABLET | ORAL | 0 refills | Status: AC | PRN

## 2021-11-01 DIAGNOSIS — C3432 Malignant neoplasm of lower lobe, left bronchus or lung: Secondary | ICD-10-CM | POA: Diagnosis not present

## 2021-11-01 DIAGNOSIS — Z9181 History of falling: Secondary | ICD-10-CM | POA: Diagnosis not present

## 2021-11-01 DIAGNOSIS — E440 Moderate protein-calorie malnutrition: Secondary | ICD-10-CM | POA: Diagnosis not present

## 2021-11-01 DIAGNOSIS — M47814 Spondylosis without myelopathy or radiculopathy, thoracic region: Secondary | ICD-10-CM | POA: Diagnosis not present

## 2021-11-01 DIAGNOSIS — Z87891 Personal history of nicotine dependence: Secondary | ICD-10-CM | POA: Diagnosis not present

## 2021-11-01 DIAGNOSIS — Z9981 Dependence on supplemental oxygen: Secondary | ICD-10-CM | POA: Diagnosis not present

## 2021-11-01 DIAGNOSIS — M810 Age-related osteoporosis without current pathological fracture: Secondary | ICD-10-CM | POA: Diagnosis not present

## 2021-11-01 DIAGNOSIS — J9601 Acute respiratory failure with hypoxia: Secondary | ICD-10-CM | POA: Diagnosis not present

## 2021-11-01 DIAGNOSIS — F32A Depression, unspecified: Secondary | ICD-10-CM | POA: Diagnosis not present

## 2021-11-01 DIAGNOSIS — J84112 Idiopathic pulmonary fibrosis: Secondary | ICD-10-CM | POA: Diagnosis not present

## 2021-11-01 DIAGNOSIS — J432 Centrilobular emphysema: Secondary | ICD-10-CM | POA: Diagnosis not present

## 2021-11-01 DIAGNOSIS — D630 Anemia in neoplastic disease: Secondary | ICD-10-CM | POA: Diagnosis not present

## 2021-11-07 DIAGNOSIS — M810 Age-related osteoporosis without current pathological fracture: Secondary | ICD-10-CM | POA: Diagnosis not present

## 2021-11-07 DIAGNOSIS — Z9181 History of falling: Secondary | ICD-10-CM | POA: Diagnosis not present

## 2021-11-07 DIAGNOSIS — C3432 Malignant neoplasm of lower lobe, left bronchus or lung: Secondary | ICD-10-CM | POA: Diagnosis not present

## 2021-11-07 DIAGNOSIS — E440 Moderate protein-calorie malnutrition: Secondary | ICD-10-CM | POA: Diagnosis not present

## 2021-11-07 DIAGNOSIS — M47814 Spondylosis without myelopathy or radiculopathy, thoracic region: Secondary | ICD-10-CM | POA: Diagnosis not present

## 2021-11-07 DIAGNOSIS — J9601 Acute respiratory failure with hypoxia: Secondary | ICD-10-CM | POA: Diagnosis not present

## 2021-11-07 DIAGNOSIS — Z9981 Dependence on supplemental oxygen: Secondary | ICD-10-CM | POA: Diagnosis not present

## 2021-11-07 DIAGNOSIS — D630 Anemia in neoplastic disease: Secondary | ICD-10-CM | POA: Diagnosis not present

## 2021-11-07 DIAGNOSIS — Z87891 Personal history of nicotine dependence: Secondary | ICD-10-CM | POA: Diagnosis not present

## 2021-11-07 DIAGNOSIS — J84112 Idiopathic pulmonary fibrosis: Secondary | ICD-10-CM | POA: Diagnosis not present

## 2021-11-07 DIAGNOSIS — J432 Centrilobular emphysema: Secondary | ICD-10-CM | POA: Diagnosis not present

## 2021-11-07 DIAGNOSIS — F32A Depression, unspecified: Secondary | ICD-10-CM | POA: Diagnosis not present

## 2021-11-08 DIAGNOSIS — J9601 Acute respiratory failure with hypoxia: Secondary | ICD-10-CM | POA: Diagnosis not present

## 2021-11-09 DIAGNOSIS — N390 Urinary tract infection, site not specified: Secondary | ICD-10-CM | POA: Diagnosis not present

## 2021-11-11 ENCOUNTER — Other Ambulatory Visit: Payer: Self-pay

## 2021-11-11 ENCOUNTER — Emergency Department (HOSPITAL_COMMUNITY): Payer: Medicare Other

## 2021-11-11 ENCOUNTER — Encounter: Payer: Self-pay | Admitting: Hematology & Oncology

## 2021-11-11 ENCOUNTER — Encounter (HOSPITAL_COMMUNITY): Payer: Self-pay | Admitting: Emergency Medicine

## 2021-11-11 ENCOUNTER — Inpatient Hospital Stay (HOSPITAL_COMMUNITY)
Admission: EM | Admit: 2021-11-11 | Discharge: 2021-11-14 | DRG: 392 | Disposition: A | Discharge status: Hospice | Payer: Medicare Other | Attending: Internal Medicine | Admitting: Internal Medicine

## 2021-11-11 DIAGNOSIS — D630 Anemia in neoplastic disease: Secondary | ICD-10-CM | POA: Diagnosis not present

## 2021-11-11 DIAGNOSIS — K5732 Diverticulitis of large intestine without perforation or abscess without bleeding: Secondary | ICD-10-CM | POA: Diagnosis not present

## 2021-11-11 DIAGNOSIS — N390 Urinary tract infection, site not specified: Secondary | ICD-10-CM

## 2021-11-11 DIAGNOSIS — Z9221 Personal history of antineoplastic chemotherapy: Secondary | ICD-10-CM | POA: Diagnosis not present

## 2021-11-11 DIAGNOSIS — R109 Unspecified abdominal pain: Principal | ICD-10-CM

## 2021-11-11 DIAGNOSIS — F32A Depression, unspecified: Secondary | ICD-10-CM | POA: Diagnosis not present

## 2021-11-11 DIAGNOSIS — D72829 Elevated white blood cell count, unspecified: Secondary | ICD-10-CM | POA: Diagnosis present

## 2021-11-11 DIAGNOSIS — Z743 Need for continuous supervision: Secondary | ICD-10-CM | POA: Diagnosis not present

## 2021-11-11 DIAGNOSIS — E876 Hypokalemia: Secondary | ICD-10-CM | POA: Diagnosis present

## 2021-11-11 DIAGNOSIS — K5792 Diverticulitis of intestine, part unspecified, without perforation or abscess without bleeding: Secondary | ICD-10-CM | POA: Diagnosis not present

## 2021-11-11 DIAGNOSIS — J84112 Idiopathic pulmonary fibrosis: Secondary | ICD-10-CM | POA: Diagnosis present

## 2021-11-11 DIAGNOSIS — Z8249 Family history of ischemic heart disease and other diseases of the circulatory system: Secondary | ICD-10-CM | POA: Diagnosis not present

## 2021-11-11 DIAGNOSIS — Z66 Do not resuscitate: Secondary | ICD-10-CM | POA: Diagnosis present

## 2021-11-11 DIAGNOSIS — Z87891 Personal history of nicotine dependence: Secondary | ICD-10-CM | POA: Diagnosis not present

## 2021-11-11 DIAGNOSIS — Z515 Encounter for palliative care: Secondary | ICD-10-CM | POA: Diagnosis not present

## 2021-11-11 DIAGNOSIS — C3432 Malignant neoplasm of lower lobe, left bronchus or lung: Secondary | ICD-10-CM | POA: Diagnosis not present

## 2021-11-11 DIAGNOSIS — J8410 Pulmonary fibrosis, unspecified: Secondary | ICD-10-CM | POA: Diagnosis not present

## 2021-11-11 DIAGNOSIS — Z9981 Dependence on supplemental oxygen: Secondary | ICD-10-CM | POA: Diagnosis not present

## 2021-11-11 DIAGNOSIS — K8000 Calculus of gallbladder with acute cholecystitis without obstruction: Secondary | ICD-10-CM | POA: Diagnosis not present

## 2021-11-11 DIAGNOSIS — K8020 Calculus of gallbladder without cholecystitis without obstruction: Secondary | ICD-10-CM | POA: Diagnosis not present

## 2021-11-11 DIAGNOSIS — E440 Moderate protein-calorie malnutrition: Secondary | ICD-10-CM | POA: Diagnosis present

## 2021-11-11 DIAGNOSIS — C3430 Malignant neoplasm of lower lobe, unspecified bronchus or lung: Secondary | ICD-10-CM | POA: Diagnosis not present

## 2021-11-11 DIAGNOSIS — R140 Abdominal distension (gaseous): Secondary | ICD-10-CM | POA: Diagnosis not present

## 2021-11-11 DIAGNOSIS — Z833 Family history of diabetes mellitus: Secondary | ICD-10-CM | POA: Diagnosis not present

## 2021-11-11 DIAGNOSIS — Z79899 Other long term (current) drug therapy: Secondary | ICD-10-CM

## 2021-11-11 DIAGNOSIS — F419 Anxiety disorder, unspecified: Secondary | ICD-10-CM | POA: Diagnosis present

## 2021-11-11 DIAGNOSIS — Z880 Allergy status to penicillin: Secondary | ICD-10-CM | POA: Diagnosis not present

## 2021-11-11 DIAGNOSIS — Z90710 Acquired absence of both cervix and uterus: Secondary | ICD-10-CM | POA: Diagnosis not present

## 2021-11-11 DIAGNOSIS — R1011 Right upper quadrant pain: Secondary | ICD-10-CM | POA: Diagnosis not present

## 2021-11-11 DIAGNOSIS — J449 Chronic obstructive pulmonary disease, unspecified: Secondary | ICD-10-CM | POA: Diagnosis present

## 2021-11-11 DIAGNOSIS — R197 Diarrhea, unspecified: Secondary | ICD-10-CM | POA: Diagnosis not present

## 2021-11-11 DIAGNOSIS — N3000 Acute cystitis without hematuria: Secondary | ICD-10-CM

## 2021-11-11 DIAGNOSIS — I700 Atherosclerosis of aorta: Secondary | ICD-10-CM | POA: Diagnosis not present

## 2021-11-11 DIAGNOSIS — J9611 Chronic respiratory failure with hypoxia: Secondary | ICD-10-CM | POA: Diagnosis present

## 2021-11-11 DIAGNOSIS — C3492 Malignant neoplasm of unspecified part of left bronchus or lung: Secondary | ICD-10-CM | POA: Diagnosis not present

## 2021-11-11 LAB — URINALYSIS, ROUTINE W REFLEX MICROSCOPIC
Bilirubin Urine: NEGATIVE
Glucose, UA: NEGATIVE mg/dL
Hgb urine dipstick: NEGATIVE
Ketones, ur: NEGATIVE mg/dL
Nitrite: NEGATIVE
Protein, ur: 30 mg/dL — AB
Specific Gravity, Urine: 1.02 (ref 1.005–1.030)
WBC, UA: >50 WBC/hpf — ABNORMAL HIGH (ref 0–5)
pH: 5 (ref 5.0–8.0)

## 2021-11-11 LAB — COMPREHENSIVE METABOLIC PANEL
ALT: 47 U/L — ABNORMAL HIGH (ref 0–44)
AST: 61 U/L — ABNORMAL HIGH (ref 15–41)
Albumin: 3.6 g/dL (ref 3.5–5.0)
Alkaline Phosphatase: 198 U/L — ABNORMAL HIGH (ref 38–126)
Anion gap: 9 (ref 5–15)
BUN: 16 mg/dL (ref 8–23)
CO2: 32 mmol/L (ref 22–32)
Calcium: 9.2 mg/dL (ref 8.9–10.3)
Chloride: 94 mmol/L — ABNORMAL LOW (ref 98–111)
Creatinine, Ser: 0.6 mg/dL (ref 0.44–1.00)
GFR, Estimated: >60 mL/min (ref >60)
Glucose, Bld: 89 mg/dL (ref 70–99)
Potassium: 4.5 mmol/L (ref 3.5–5.1)
Sodium: 135 mmol/L (ref 135–145)
Total Bilirubin: 0.5 mg/dL (ref 0.3–1.2)
Total Protein: 7.7 g/dL (ref 6.5–8.1)

## 2021-11-11 LAB — LACTIC ACID, PLASMA: Lactic Acid, Venous: 0.9 mmol/L (ref 0.5–1.9)

## 2021-11-11 LAB — CBC
HCT: 33.3 % — ABNORMAL LOW (ref 36.0–46.0)
Hemoglobin: 10.7 g/dL — ABNORMAL LOW (ref 12.0–15.0)
MCH: 30.7 pg (ref 26.0–34.0)
MCHC: 32.1 g/dL (ref 30.0–36.0)
MCV: 95.7 fL (ref 80.0–100.0)
Platelets: 345 10*3/uL (ref 150–400)
RBC: 3.48 MIL/uL — ABNORMAL LOW (ref 3.87–5.11)
RDW: 12.8 % (ref 11.5–15.5)
WBC: 15.2 10*3/uL — ABNORMAL HIGH (ref 4.0–10.5)
nRBC: 0 % (ref 0.0–0.2)

## 2021-11-11 LAB — LIPASE, BLOOD: Lipase: 27 U/L (ref 11–51)

## 2021-11-11 MED ORDER — ENOXAPARIN SODIUM 40 MG/0.4ML IJ SOSY: 40.0000 mg | PREFILLED_SYRINGE | INTRAMUSCULAR | Status: DC

## 2021-11-11 MED ORDER — OXYCODONE HCL 5 MG PO TABS
5.0000 mg | ORAL_TABLET | ORAL | Status: DC | PRN
  Administered 2021-11-13: 5 mg via ORAL
  Filled 2021-11-11: qty 1

## 2021-11-11 MED ORDER — SODIUM CHLORIDE 0.9 % IV SOLN: INTRAVENOUS | Status: DC

## 2021-11-11 MED ORDER — SODIUM CHLORIDE 0.9 % IV BOLUS
1000.0000 mL | Freq: Once | INTRAVENOUS | Status: AC
  Administered 2021-11-11: 1000 mL via INTRAVENOUS

## 2021-11-11 MED ORDER — PANCRELIPASE (LIP-PROT-AMYL) 12000-38000 UNITS PO CPEP
72000.0000 [IU] | ORAL_CAPSULE | Freq: Three times a day (TID) | ORAL | Status: DC
  Administered 2021-11-12 – 2021-11-14 (×6): 72000 [IU] via ORAL
  Filled 2021-11-11 (×4): qty 6

## 2021-11-11 MED ORDER — ACETAMINOPHEN 325 MG PO TABS
650.0000 mg | ORAL_TABLET | Freq: Four times a day (QID) | ORAL | Status: DC | PRN
Start: 2021-11-11 — End: 2021-11-14

## 2021-11-11 MED ORDER — ALBUTEROL SULFATE (2.5 MG/3ML) 0.083% IN NEBU: 2.5000 mg | INHALATION_SOLUTION | RESPIRATORY_TRACT | Status: DC | PRN

## 2021-11-11 MED ORDER — MORPHINE SULFATE (PF) 2 MG/ML IV SOLN: 2.0000 mg | INTRAVENOUS | Status: DC | PRN

## 2021-11-11 MED ORDER — CIPROFLOXACIN IN D5W 400 MG/200ML IV SOLN
400.0000 mg | Freq: Two times a day (BID) | INTRAVENOUS | Status: DC
  Administered 2021-11-11 – 2021-11-12 (×2): 400 mg via INTRAVENOUS
  Filled 2021-11-11 (×2): qty 200

## 2021-11-11 MED ORDER — PANCRELIPASE (LIP-PROT-AMYL) 12000-38000 UNITS PO CPEP
36000.0000 [IU] | ORAL_CAPSULE | Freq: Three times a day (TID) | ORAL | Status: DC | PRN
  Filled 2021-11-11: qty 1

## 2021-11-11 MED ORDER — METRONIDAZOLE 500 MG/100ML IV SOLN
500.0000 mg | Freq: Two times a day (BID) | INTRAVENOUS | Status: DC
  Administered 2021-11-11 – 2021-11-14 (×6): 500 mg via INTRAVENOUS
  Filled 2021-11-11 (×6): qty 100

## 2021-11-11 MED ORDER — IOHEXOL 300 MG/ML  SOLN
100.0000 mL | Freq: Once | INTRAMUSCULAR | Status: AC | PRN
  Administered 2021-11-11: 80 mL via INTRAVENOUS

## 2021-11-11 MED ORDER — SODIUM CHLORIDE (PF) 0.9 % IJ SOLN
INTRAMUSCULAR | Status: AC
  Filled 2021-11-11: qty 50

## 2021-11-11 MED ORDER — ALPRAZOLAM 0.5 MG PO TABS
1.0000 mg | ORAL_TABLET | Freq: Once | ORAL | Status: AC
  Administered 2021-11-11: 1 mg via ORAL
  Filled 2021-11-11: qty 2

## 2021-11-11 MED ORDER — ONDANSETRON HCL 4 MG PO TABS: 4.0000 mg | ORAL_TABLET | Freq: Four times a day (QID) | ORAL | Status: DC | PRN

## 2021-11-11 MED ORDER — ALPRAZOLAM 0.5 MG PO TABS
0.5000 mg | ORAL_TABLET | Freq: Three times a day (TID) | ORAL | Status: DC | PRN
  Administered 2021-11-12 – 2021-11-14 (×6): 0.5 mg via ORAL
  Filled 2021-11-11 (×6): qty 1

## 2021-11-11 MED ORDER — MORPHINE SULFATE (PF) 4 MG/ML IV SOLN
4.0000 mg | Freq: Once | INTRAVENOUS | Status: AC
  Administered 2021-11-11: 4 mg via INTRAVENOUS
  Filled 2021-11-11: qty 1

## 2021-11-11 MED ORDER — FENTANYL CITRATE PF 50 MCG/ML IJ SOSY
50.0000 ug | PREFILLED_SYRINGE | Freq: Once | INTRAMUSCULAR | Status: AC
  Administered 2021-11-11: 50 ug via INTRAVENOUS
  Filled 2021-11-11: qty 1

## 2021-11-11 MED ORDER — SENNOSIDES-DOCUSATE SODIUM 8.6-50 MG PO TABS: 1.0000 | ORAL_TABLET | Freq: Every evening | ORAL | Status: DC | PRN

## 2021-11-11 MED ORDER — ACETAMINOPHEN 650 MG RE SUPP: 650.0000 mg | Freq: Four times a day (QID) | RECTAL | Status: DC | PRN

## 2021-11-11 MED ORDER — ONDANSETRON HCL 4 MG/2ML IJ SOLN
4.0000 mg | Freq: Four times a day (QID) | INTRAMUSCULAR | Status: DC | PRN
  Administered 2021-11-12 – 2021-11-13 (×2): 4 mg via INTRAVENOUS
  Filled 2021-11-11 (×2): qty 2

## 2021-11-11 NOTE — ED Triage Notes (Signed)
BIB [facility]  ?Per EMS: Pt coming from home w/ c/o abd pain. Dx w/ UTI 2 days ago. Pt been taking keflex and hasn't been getting better.  ?3L [place] at baseline  ?VSS  ?94/66 ?92HR ?96%  ?125CBG  ?DNR w/ pt  ?

## 2021-11-11 NOTE — H&P (Signed)
?History and Physical  ? ? ?Molly Welch MPN:[id number] DOB: 19-Feb-1946 DOA: 11/11/2021 ? ?PCP: Carolee Rota, NP  ? ?Patient coming from: Home ? ?I have personally briefly reviewed patient's old medical records in [place] ? ?Chief Complaint: Abdominal pain ? ?HPI: Molly Welch is a 76 y.o. 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 Marin Olp, interstitial lung disease, chronic hypoxic respiratory failure on home oxygen, anxiety/depression, recent diagnosis of UTI for which she was started on oral ciprofloxacin presented with worsening abdominal pain.  Patient complains of worsening abdominal pain for the last 2 days with foul-smelling and darker urine for which she was seen in urgent care and diagnosed with UTI and started on ciprofloxacin.  Pain is getting worse, started in the lower abdomen but is now getting diffuse with no radiation and no relieving factors.  Patient denies any fevers or chills but has had poor appetite.  No diarrhea, hematochezia or melena.  Denies any chest pain, shortness of breath, cough, loss of consciousness or seizures.   ? ?ED Course: She was found to have leukocytosis and evidence of acute diverticulitis on CT along with cholelithiasis.  Right upper quadrant ultrasound confirmed cholelithiasis but no evidence of cholecystitis.  UA was positive for UTI.  She was started on ciprofloxacin and Flagyl IV and given IV pain medication. ?Hospitalist service was called to evaluate the patient. ? ?Review of Systems: As per HPI otherwise all other systems were reviewed and are negative. ? ? ?Past Medical History:  ?Diagnosis Date  ? Abnormal chest x-ray 07/08/2003  ? COPD changes (2005) + interstitial lung dz changes (2002)  ? Anxiety and depression   ? Prozac, celexa, paxil, wellbutrin, and buspar all failures.  Pt has stated xanax is only med that helps.  ? Depression   ? Goals  of care, counseling/discussion 04/19/2021  ? Lung cancer, lower lobe ([place]) 04/19/2021  ? Tobacco dependence   ? ? ?Past Surgical History:  ?Procedure Laterality Date  ? ABDOMINAL HYSTERECTOMY  1979  ? APPENDECTOMY  1966  ? CATARACT EXTRACTION, BILATERAL    ? IR IMAGING GUIDED PORT INSERTION  04/25/2021  ? ? ? reports that she quit smoking about 8 months ago. Her smoking use included cigarettes. She has a 27.50 pack-year smoking history. She has never used smokeless tobacco. She reports that she does not drink alcohol and does not use drugs. ? ?Allergies  ?Allergen Reactions  ? Ativan [Lorazepam] Other (See Comments)  ? Doxycycline Swelling  ? Prednisone Other (See Comments)  ?  Very emotional, crying, angry  ? Carbamates   ? Penicillin G Other (See Comments)  ? Sulfa Antibiotics Other (See Comments)  ? Penicillins Rash  ?  Has patient had a PCN reaction causing immediate rash, facial/tongue/throat swelling, SOB or lightheadedness with hypotension: yes ?Has patient had a PCN reaction causing severe rash involving mucus membranes or skin necrosis: no ?Has patient had a PCN reaction that required hospitalization: no ?Has patient had a PCN reaction occurring within the last 10 years: no ?If all of the above answers are "NO", then may proceed with Cephalosporin use. ?  ? Sulfonamide Derivatives Rash  ? ? ?Family History  ?Problem Relation Age of Onset  ? Suicidality Father   ? Heart disease Brother   ? Cirrhosis Brother   ? Depression Son   ? GI Disease Son   ? Arthritis Son   ?  Diabetes Other   ? ? ?Prior to Admission medications   ?Medication Sig Start Date End Date Taking? Authorizing Provider  ?acetaminophen (TYLENOL) 500 MG tablet Take 500 mg by mouth daily as needed for headache (pain).    [provider]  ?albuterol (VENTOLIN HFA) 108 (90 Base) MCG/ACT inhaler Inhale 1-2 puffs into the lungs every 6 (six) hours as needed. 07/25/21   Parrett, Fonnie Mu, NP  ?ALPRAZolam (XANAX) 1 MG tablet Take 0.5-1 mg by  mouth See admin instructions. Take one tablet (1 mg) by mouth at 5:30am, take 1/2 tablet (0.5 mg) at 7:30am, 3pm and at bedtime. 03/04/21   [provider]  ?Camillo Flaming MEDICATION Lift Chair Diagnosis 03/28/21   Tat, Eustace Quail, DO  ?AMBULATORY NON FORMULARY MEDICATION Rollator Diagnosis G 62.89 03/28/21   TatEustace Quail, DO  ?Ascorbic Acid (VITAMIN C) 1000 MG tablet Take 1,000 mg by mouth every morning.    [provider]  ?azelastine (OPTIVAR) 0.05 % ophthalmic solution Place 1 drop into both eyes 2 (two) times daily as needed (dry eyes/itching).    [provider]  ?diclofenac Sodium (VOLTAREN) 1 % GEL Apply 1 application topically at bedtime.    [provider]  ?diphenoxylate-atropine (LOMOTIL) 2.5-0.025 MG tablet Take 1 tablet by mouth 4 (four) times daily as needed for diarrhea or loose stools. 10/28/21   Volanda Napoleon, MD  ?feeding supplement (ENSURE ENLIVE / ENSURE PLUS) LIQD Take 237 mLs by mouth 3 (three) times daily between meals. 07/11/21   Mariel Aloe, MD  ?HYDROcodone-acetaminophen (NORCO/VICODIN) 5-325 MG tablet Take 1 tablet by mouth every 4 (four) hours as needed for moderate pain. 10/28/21   Volanda Napoleon, MD  ?hypromellose (SYSTANE OVERNIGHT THERAPY) 0.3 % GEL ophthalmic ointment Place 1 application into both eyes at bedtime as needed for dry eyes.    [provider]  ?lamoTRIgine (LAMICTAL) 100 MG tablet Take 100 mg by mouth at bedtime. 07/18/21   [provider]  ?lamoTRIgine (LAMICTAL) 25 MG tablet Take 100 mg by mouth every morning. 05/01/16   [provider]  ?lipase/protease/amylase (CREON) 36000 UNITS CPEP capsule Take 36,000 Units by mouth See admin instructions. Take two capsules (72000 units) by mouth three times daily with meals and one capsule (36000 units) with snacks    [provider]  ?Multiple Vitamin (MULTIVITAMIN WITH MINERALS) TABS tablet Take 1 tablet by mouth every morning. Centrum     [provider]  ?Probiotic Product (PROBIOTIC PO) Take 1 tablet by mouth every morning.    [provider]  ?prochlorperazine (COMPAZINE) 10 MG tablet Take 1 tablet (10 mg total) by mouth every 6 (six) hours as needed (Nausea or vomiting). 07/29/21   Volanda Napoleon, MD  ?TURMERIC PO Take 1 capsule by mouth daily.    [provider]  ?valACYclovir (VALTREX) 500 MG tablet Take 500 mg by mouth every morning.    [provider]  ? ? ?Physical Exam: ?Vitals:  ? 11/11/21 1105 11/11/21 1435 11/11/21 1530 11/11/21 1600  ?BP: (!) 101/56 120/65 111/66 (!) 117/59  ?Pulse: 77 84 89 96  ?Resp: 14 (!) 29 18 (!) 21  ?Temp: 97.8 ?F (36.6 ?C)     ?TempSrc: Oral     ?SpO2: 100% 100% 100% 95%  ? ? ?Constitutional: NAD, calm, comfortable.  Currently on 3 L oxygen via nasal cannula.  Extremely thinly built. ?Vitals:  ? 11/11/21 1105 11/11/21 1435 11/11/21 1530 11/11/21 1600  ?BP: (!) 101/56  120/65 111/66 (!) 117/59  ?Pulse: 77 84 89 96  ?Resp: 14 (!) 29 18 (!) 21  ?Temp: 97.8 ?F (36.6 ?C)     ?TempSrc: Oral     ?SpO2: 100% 100% 100% 95%  ? ?Eyes: PERRL, lids and conjunctivae normal ?ENMT: Mucous membranes are dry.  Posterior pharynx clear of any exudate or lesions. ?Neck: normal, supple, no masses, no thyromegaly ?Respiratory: bilateral decreased breath sounds at bases, no wheezing, no crackles.  Intermittently tachypneic. no accessory muscle use.  ?Cardiovascular: S1 S2 positive, rate controlled. No extremity edema. 2+ pedal pulses.  ?Abdomen: Lower quadrant tenderness present, no rebound tenderness, no masses palpated. No hepatosplenomegaly. Bowel sounds positive.  ?Musculoskeletal: no clubbing / cyanosis. No joint deformity upper and lower extremities.  ?Skin: no rashes, lesions, ulcers. No induration ?Neurologic: CN 2-12 grossly intact. Moving extremities. No focal neurologic deficits.  ?Psychiatric: Affect is mostly flat.  No signs of agitation. ? ?Labs on Admission: I have personally reviewed  following labs and imaging studies ? ?CBC: ?Recent Labs  ?Lab 11/11/21 ?1112  ?WBC 15.2*  ?HGB 10.7*  ?HCT 33.3*  ?MCV 95.7  ?PLT 345  ? ?Basic Metabolic Panel: ?Recent Labs  ?Lab 11/11/21 ?1112  ?NA 135  ?

## 2021-11-11 NOTE — ED Provider Triage Note (Signed)
Emergency Medicine Provider Triage Evaluation Note ? ?Molly Welch , a 76 y.o. female  was evaluated in triage.  Pt complains of abdominal pain that started over the weekend.  Patient has a history of stage IV lung cancer not currently on any therapy.  She denies any nausea, vomiting, diarrhea, fever, chills.  Patient also feeling weaker and her ankles. ? ?Review of Systems  ?Positive:  ?Negative: See above  ? ?Physical Exam  ?BP (!) 101/56 (BP Location: Left Arm)   Pulse 77   Temp 97.8 ?F (36.6 ?C) (Oral)   Resp 14   SpO2 100%  ?Gen:   Awake, no distress   ?Resp:  Normal effort  ?MSK:   Moves extremities without difficulty  ?Other:  Diffuse abdominal tenderness. ? ?Medical Decision Making  ?Medically screening exam initiated at 12:02 PM.  Appropriate orders placed.  SUKANYA GOLDBLATT was informed that the remainder of the evaluation will be completed by another provider, this initial triage assessment does not replace that evaluation, and the importance of remaining in the ED until their evaluation is complete. ? ? ?  ?Hendricks Limes, PA-C ?11/11/21 1203 ? ?

## 2021-11-11 NOTE — ED Provider Notes (Signed)
?[facility] DEPT ?Provider Note ? ? ?CSN: [id number] ?Arrival date & time: 11/11/21  1058 ? ?  ? ?History ? ?Chief Complaint  ?Patient presents with  ? Abdominal Pain  ? Urinary Tract Infection  ? ? ?Molly Welch is a 76 y.o. female. ? ?The history is provided by the patient, medical records and a relative. No language interpreter was used.  ?Abdominal Pain ?Pain location:  Generalized ?Pain quality: aching, cramping and sharp   ?Pain radiates to:  Does not radiate ?Pain severity:  Severe (10/10) ?Onset quality:  Gradual ?Duration:  3 days ?Timing:  Constant ?Progression:  Worsening ?Chronicity:  New ?Relieved by:  Nothing ?Worsened by:  Nothing ?Ineffective treatments:  None tried ?Associated symptoms: constipation and fatigue   ?Associated symptoms: no belching, no chest pain, no chills, no cough, no diarrhea, no dysuria, no fever, no flatus, no nausea, no shortness of breath and no vomiting   ?Urinary Tract Infection ?Associated symptoms: abdominal pain   ?Associated symptoms: no fever, no flank pain, no nausea and no vomiting   ? ?  ? ?Home Medications ?Prior to Admission medications   ?Medication Sig Start Date End Date Taking? Authorizing Provider  ?acetaminophen (TYLENOL) 500 MG tablet Take 500 mg by mouth daily as needed for headache (pain).    [provider]  ?albuterol (VENTOLIN HFA) 108 (90 Base) MCG/ACT inhaler Inhale 1-2 puffs into the lungs every 6 (six) hours as needed. 07/25/21   Parrett, Fonnie Mu, NP  ?ALPRAZolam (XANAX) 1 MG tablet Take 0.5-1 mg by mouth See admin instructions. Take one tablet (1 mg) by mouth at 5:30am, take 1/2 tablet (0.5 mg) at 7:30am, 3pm and at bedtime. 03/04/21   [provider]  ?Camillo Flaming MEDICATION Lift Chair Diagnosis 03/28/21   Tat, Eustace Quail, DO  ?AMBULATORY NON FORMULARY MEDICATION Rollator Diagnosis G 62.89 03/28/21   TatEustace Quail, DO  ?Ascorbic Acid (VITAMIN C) 1000 MG tablet Take 1,000 mg by mouth every  morning.    [provider]  ?azelastine (OPTIVAR) 0.05 % ophthalmic solution Place 1 drop into both eyes 2 (two) times daily as needed (dry eyes/itching).    [provider]  ?diclofenac Sodium (VOLTAREN) 1 % GEL Apply 1 application topically at bedtime.    [provider]  ?diphenoxylate-atropine (LOMOTIL) 2.5-0.025 MG tablet Take 1 tablet by mouth 4 (four) times daily as needed for diarrhea or loose stools. 10/28/21   Volanda Napoleon, MD  ?feeding supplement (ENSURE ENLIVE / ENSURE PLUS) LIQD Take 237 mLs by mouth 3 (three) times daily between meals. 07/11/21   Mariel Aloe, MD  ?HYDROcodone-acetaminophen (NORCO/VICODIN) 5-325 MG tablet Take 1 tablet by mouth every 4 (four) hours as needed for moderate pain. 10/28/21   Volanda Napoleon, MD  ?hypromellose (SYSTANE OVERNIGHT THERAPY) 0.3 % GEL ophthalmic ointment Place 1 application into both eyes at bedtime as needed for dry eyes.    [provider]  ?lamoTRIgine (LAMICTAL) 100 MG tablet Take 100 mg by mouth at bedtime. 07/18/21   [provider]  ?lamoTRIgine (LAMICTAL) 25 MG tablet Take 100 mg by mouth every morning. 05/01/16   [provider]  ?lipase/protease/amylase (CREON) 36000 UNITS CPEP capsule Take 36,000 Units by mouth See admin instructions. Take two capsules (72000 units) by mouth three times daily with meals and one capsule (36000 units) with snacks    [provider]  ?Multiple Vitamin (MULTIVITAMIN WITH MINERALS) TABS tablet Take 1 tablet by mouth every  morning. Centrum    [provider]  ?Probiotic Product (PROBIOTIC PO) Take 1 tablet by mouth every morning.    [provider]  ?prochlorperazine (COMPAZINE) 10 MG tablet Take 1 tablet (10 mg total) by mouth every 6 (six) hours as needed (Nausea or vomiting). 07/29/21   Volanda Napoleon, MD  ?TURMERIC PO Take 1 capsule by mouth daily.    [provider]  ?valACYclovir (VALTREX) 500 MG tablet Take 500 mg by mouth  every morning.    [provider]  ?   ? ?Allergies    ?Ativan [lorazepam], Doxycycline, Prednisone, Carbamates, Penicillin g, Sulfa antibiotics, Penicillins, and Sulfonamide derivatives   ? ?Review of Systems   ?Review of Systems  ?Constitutional:  Positive for diaphoresis and fatigue. Negative for chills and fever.  ?HENT:  Negative for congestion.   ?Eyes:  Negative for visual disturbance.  ?Respiratory:  Negative for cough, chest tightness, shortness of breath and wheezing.   ?Cardiovascular:  Negative for chest pain, palpitations and leg swelling.  ?Gastrointestinal:  Positive for abdominal distention, abdominal pain and constipation. Negative for diarrhea, flatus, nausea and vomiting.  ?Genitourinary:  Negative for dysuria and flank pain.  ?     Dark and foul smelling with abd pain  ?Musculoskeletal:  Negative for back pain, neck pain and neck stiffness.  ?Skin:  Negative for rash and wound.  ?Neurological:  Negative for light-headedness and headaches.  ?Psychiatric/Behavioral:  Negative for agitation.   ?All other systems reviewed and are negative. ? ?Physical Exam ?Updated Vital Signs ?BP (!) 101/56 (BP Location: Left Arm)   Pulse 77   Temp 97.8 ?F (36.6 ?C) (Oral)   Resp 14   SpO2 100%  ?Physical Exam ?Vitals and nursing note reviewed.  ?Constitutional:   ?   General: She is not in acute distress. ?   Appearance: She is well-developed. She is not ill-appearing, toxic-appearing or diaphoretic.  ?HENT:  ?   Head: Normocephalic and atraumatic.  ?   Mouth/Throat:  ?   Mouth: Mucous membranes are dry.  ?Eyes:  ?   General: No scleral icterus. ?   Conjunctiva/sclera: Conjunctivae normal.  ?Cardiovascular:  ?   Rate and Rhythm: Normal rate and regular rhythm.  ?   Pulses: Normal pulses.  ?   Heart sounds: No murmur heard. ?Pulmonary:  ?   Effort: Pulmonary effort is normal. No respiratory distress.  ?   Breath sounds: Normal breath sounds. No wheezing, rhonchi or rales.  ?Chest:  ?   Chest wall: No  tenderness.  ?Abdominal:  ?   General: Bowel sounds are normal. There is distension.  ?   Palpations: Abdomen is soft.  ?   Tenderness: There is generalized abdominal tenderness. There is no right CVA tenderness, left CVA tenderness, guarding or rebound.  ?Musculoskeletal:     ?   General: No swelling.  ?   Cervical back: Neck supple.  ?Skin: ?   General: Skin is warm and dry.  ?   Capillary Refill: Capillary refill takes less than 2 seconds.  ?   Coloration: Skin is not pale.  ?   Findings: No rash.  ?Neurological:  ?   General: No focal deficit present.  ?   Mental Status: She is alert.  ?Psychiatric:     ?   Mood and Affect: Mood normal.  ? ? ?ED Results / Procedures / Treatments   ?Labs ?(all labs ordered are listed, but only abnormal results are displayed) ?Labs Reviewed  ?  COMPREHENSIVE METABOLIC PANEL - Abnormal; Notable for the following components:  ?    Result Value  ? Chloride 94 (*)   ? AST 61 (*)   ? ALT 47 (*)   ? Alkaline Phosphatase 198 (*)   ? All other components within normal limits  ?CBC - Abnormal; Notable for the following components:  ? WBC 15.2 (*)   ? RBC 3.48 (*)   ? Hemoglobin 10.7 (*)   ? HCT 33.3 (*)   ? All other components within normal limits  ?URINALYSIS, ROUTINE W REFLEX MICROSCOPIC - Abnormal; Notable for the following components:  ? APPearance HAZY (*)   ? Protein, ur 30 (*)   ? Leukocytes,Ua MODERATE (*)   ? WBC, UA >50 (*)   ? Bacteria, UA RARE (*)   ? All other components within normal limits  ?CULTURE, BLOOD (ROUTINE X 2)  ?CULTURE, BLOOD (ROUTINE X 2)  ?URINE CULTURE  ?LIPASE, BLOOD  ?LACTIC ACID, PLASMA  ?LACTIC ACID, PLASMA  ? ? ?EKG ?None ? ?Radiology ?CT ABDOMEN PELVIS W CONTRAST ? ?Result Date: 11/11/2021 ?CLINICAL DATA:  Abdominal pain beginning over the weekend, history of stage IV lung cancer EXAM: CT ABDOMEN AND PELVIS WITH CONTRAST TECHNIQUE: Multidetector CT imaging of the abdomen and pelvis was performed using the standard protocol following bolus administration of  intravenous contrast. RADIATION DOSE REDUCTION: This exam was performed according to the departmental dose-optimization program which includes automated exposure control, adjustment of the mA and/or kV acco

## 2021-11-11 NOTE — Progress Notes (Signed)
[facility]) Hospital Liaison note: ? ?This patient is currently enrolled in [facility] outpatient-based Palliative Care. Will continue to follow for disposition. ? ?Please call with any outpatient palliative questions or concerns. ? ?Thank you, ?Lorelee Market, LPN ?[facility] Hospital Liaison ?[phone] ?

## 2021-11-12 DIAGNOSIS — R197 Diarrhea, unspecified: Secondary | ICD-10-CM | POA: Diagnosis not present

## 2021-11-12 DIAGNOSIS — D630 Anemia in neoplastic disease: Secondary | ICD-10-CM | POA: Diagnosis present

## 2021-11-12 DIAGNOSIS — C3430 Malignant neoplasm of lower lobe, unspecified bronchus or lung: Secondary | ICD-10-CM | POA: Diagnosis not present

## 2021-11-12 DIAGNOSIS — K5732 Diverticulitis of large intestine without perforation or abscess without bleeding: Secondary | ICD-10-CM | POA: Diagnosis present

## 2021-11-12 DIAGNOSIS — Z880 Allergy status to penicillin: Secondary | ICD-10-CM | POA: Diagnosis not present

## 2021-11-12 DIAGNOSIS — J9611 Chronic respiratory failure with hypoxia: Secondary | ICD-10-CM | POA: Diagnosis not present

## 2021-11-12 DIAGNOSIS — C3432 Malignant neoplasm of lower lobe, left bronchus or lung: Secondary | ICD-10-CM | POA: Diagnosis present

## 2021-11-12 DIAGNOSIS — N390 Urinary tract infection, site not specified: Secondary | ICD-10-CM | POA: Diagnosis present

## 2021-11-12 DIAGNOSIS — Z833 Family history of diabetes mellitus: Secondary | ICD-10-CM | POA: Diagnosis not present

## 2021-11-12 DIAGNOSIS — J8410 Pulmonary fibrosis, unspecified: Secondary | ICD-10-CM | POA: Diagnosis not present

## 2021-11-12 DIAGNOSIS — J449 Chronic obstructive pulmonary disease, unspecified: Secondary | ICD-10-CM | POA: Diagnosis present

## 2021-11-12 DIAGNOSIS — R109 Unspecified abdominal pain: Secondary | ICD-10-CM

## 2021-11-12 DIAGNOSIS — Z79899 Other long term (current) drug therapy: Secondary | ICD-10-CM | POA: Diagnosis not present

## 2021-11-12 DIAGNOSIS — K8000 Calculus of gallbladder with acute cholecystitis without obstruction: Secondary | ICD-10-CM | POA: Diagnosis not present

## 2021-11-12 DIAGNOSIS — N3000 Acute cystitis without hematuria: Secondary | ICD-10-CM | POA: Diagnosis not present

## 2021-11-12 DIAGNOSIS — J84112 Idiopathic pulmonary fibrosis: Secondary | ICD-10-CM | POA: Diagnosis not present

## 2021-11-12 DIAGNOSIS — F419 Anxiety disorder, unspecified: Secondary | ICD-10-CM | POA: Diagnosis present

## 2021-11-12 DIAGNOSIS — C3492 Malignant neoplasm of unspecified part of left bronchus or lung: Secondary | ICD-10-CM

## 2021-11-12 DIAGNOSIS — Z515 Encounter for palliative care: Secondary | ICD-10-CM | POA: Diagnosis not present

## 2021-11-12 DIAGNOSIS — Z9981 Dependence on supplemental oxygen: Secondary | ICD-10-CM | POA: Diagnosis not present

## 2021-11-12 DIAGNOSIS — Z66 Do not resuscitate: Secondary | ICD-10-CM | POA: Diagnosis present

## 2021-11-12 DIAGNOSIS — Z90710 Acquired absence of both cervix and uterus: Secondary | ICD-10-CM | POA: Diagnosis not present

## 2021-11-12 DIAGNOSIS — E876 Hypokalemia: Secondary | ICD-10-CM | POA: Diagnosis present

## 2021-11-12 DIAGNOSIS — K5792 Diverticulitis of intestine, part unspecified, without perforation or abscess without bleeding: Secondary | ICD-10-CM | POA: Diagnosis not present

## 2021-11-12 DIAGNOSIS — Z9221 Personal history of antineoplastic chemotherapy: Secondary | ICD-10-CM | POA: Diagnosis not present

## 2021-11-12 DIAGNOSIS — D72829 Elevated white blood cell count, unspecified: Secondary | ICD-10-CM | POA: Diagnosis not present

## 2021-11-12 DIAGNOSIS — Z8249 Family history of ischemic heart disease and other diseases of the circulatory system: Secondary | ICD-10-CM | POA: Diagnosis not present

## 2021-11-12 DIAGNOSIS — F32A Depression, unspecified: Secondary | ICD-10-CM | POA: Diagnosis present

## 2021-11-12 DIAGNOSIS — Z87891 Personal history of nicotine dependence: Secondary | ICD-10-CM | POA: Diagnosis not present

## 2021-11-12 DIAGNOSIS — E440 Moderate protein-calorie malnutrition: Secondary | ICD-10-CM | POA: Diagnosis present

## 2021-11-12 DIAGNOSIS — K8020 Calculus of gallbladder without cholecystitis without obstruction: Secondary | ICD-10-CM | POA: Diagnosis present

## 2021-11-12 LAB — COMPREHENSIVE METABOLIC PANEL
ALT: 32 U/L (ref 0–44)
AST: 30 U/L (ref 15–41)
Albumin: 3 g/dL — ABNORMAL LOW (ref 3.5–5.0)
Alkaline Phosphatase: 174 U/L — ABNORMAL HIGH (ref 38–126)
Anion gap: 8 (ref 5–15)
BUN: 10 mg/dL (ref 8–23)
CO2: 28 mmol/L (ref 22–32)
Calcium: 8.3 mg/dL — ABNORMAL LOW (ref 8.9–10.3)
Chloride: 101 mmol/L (ref 98–111)
Creatinine, Ser: 0.56 mg/dL (ref 0.44–1.00)
GFR, Estimated: >60 mL/min (ref >60)
Glucose, Bld: 93 mg/dL (ref 70–99)
Potassium: 3.7 mmol/L (ref 3.5–5.1)
Sodium: 137 mmol/L (ref 135–145)
Total Bilirubin: 0.7 mg/dL (ref 0.3–1.2)
Total Protein: 6.6 g/dL (ref 6.5–8.1)

## 2021-11-12 LAB — CBC
HCT: 27.8 % — ABNORMAL LOW (ref 36.0–46.0)
Hemoglobin: 9.2 g/dL — ABNORMAL LOW (ref 12.0–15.0)
MCH: 31.3 pg (ref 26.0–34.0)
MCHC: 33.1 g/dL (ref 30.0–36.0)
MCV: 94.6 fL (ref 80.0–100.0)
Platelets: 292 10*3/uL (ref 150–400)
RBC: 2.94 MIL/uL — ABNORMAL LOW (ref 3.87–5.11)
RDW: 12.8 % (ref 11.5–15.5)
WBC: 12.8 10*3/uL — ABNORMAL HIGH (ref 4.0–10.5)
nRBC: 0 % (ref 0.0–0.2)

## 2021-11-12 LAB — MAGNESIUM: Magnesium: 2 mg/dL (ref 1.7–2.4)

## 2021-11-12 MED ORDER — SODIUM CHLORIDE 0.9 % IV SOLN
2.0000 g | Freq: Two times a day (BID) | INTRAVENOUS | Status: DC
  Administered 2021-11-12 – 2021-11-14 (×4): 2 g via INTRAVENOUS
  Filled 2021-11-12 (×4): qty 12.5

## 2021-11-12 MED ORDER — SODIUM CHLORIDE 0.9% FLUSH
10.0000 mL | Freq: Two times a day (BID) | INTRAVENOUS | Status: DC
  Administered 2021-11-12 – 2021-11-14 (×2): 10 mL

## 2021-11-12 MED ORDER — ENOXAPARIN SODIUM 40 MG/0.4ML IJ SOSY
40.0000 mg | PREFILLED_SYRINGE | INTRAMUSCULAR | Status: DC
  Administered 2021-11-12 – 2021-11-13 (×2): 40 mg via SUBCUTANEOUS
  Filled 2021-11-12 (×2): qty 0.4

## 2021-11-12 MED ORDER — CHLORHEXIDINE GLUCONATE CLOTH 2 % EX PADS
6.0000 | MEDICATED_PAD | Freq: Every day | CUTANEOUS | Status: DC
  Administered 2021-11-12 – 2021-11-14 (×3): 6 via TOPICAL

## 2021-11-12 MED ORDER — FENTANYL 12 MCG/HR TD PT72
1.0000 | MEDICATED_PATCH | TRANSDERMAL | Status: DC
  Administered 2021-11-12: 1 via TRANSDERMAL
  Filled 2021-11-12: qty 1

## 2021-11-12 NOTE — Progress Notes (Signed)
?PROGRESS NOTE ? ? ? ?Molly Welch  LSL:[id number] DOB: 29-Nov-1945 DOA: 11/11/2021 ?PCP: Carolee Rota, NP  ? ?Brief Narrative:  ?75 y.o. 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 Marin Olp, interstitial lung disease, chronic hypoxic respiratory failure on home oxygen, anxiety/depression, recent diagnosis of UTI for which she was started on oral ciprofloxacin presented with worsening abdominal pain.  On presentation, she was found to have leukocytosis and evidence of acute diverticulitis on CT along with cholelithiasis.  Right upper quadrant ultrasound confirmed cholelithiasis but no evidence of cholecystitis.  UA was positive for UTI.  She was started on ciprofloxacin and Flagyl IV and given IV pain medication. ? ?Assessment & Plan: ? Acute diverticulitis ?UTI: Present on admission ?Leukocytosis ?Elevated LFTs ?-Presented with worsening abdominal pain despite being on ciprofloxacin for 2 days.  Imaging showed finding suggestive of acute diverticulitis with cholelithiasis without evidence of cholecystitis.  In any case, patient is not a surgical candidate because of history of lung cancer/IPF and chronically on supplemental oxygen. ?-Currently on IV ciprofloxacin and Flagyl.  Apparently allergic to penicillin but she has tolerated cephalosporins in the past.  Cultures negative so far.  Switch Cipro to cefepime. ?-Continue pain medications, antiemetics as needed.  Still in significant amount of pain Fentanyl patch has been added by oncology team. ?-WBCs improving to 12.8 today.  LFTs improving as well.  Monitor. ?  ?Anemia of chronic disease ?-From lung cancer.  No signs of bleeding.  Monitor ?  ?Stage IIIb squamous cell carcinoma of left lower lung status post chemotherapy ?Chronic respiratory failure with hypoxia on supplemental oxygen at home ?-Currently being followed by Dr. Marin Olp and palliative care.   Patient/family not ready for hospice yet.  Overall prognosis is poor.  Outpatient follow-up with oncology and palliative care ?  ?Anxiety/depression--continue as needed Xanax ?  ?Moderate malnutrition ?-Consult nutrition ?  ?Physical deconditioning ?-PT eval ?  ?DVT prophylaxis: Lovenox ?Code Status: DNR ?Family Communication: Daughter at bedside on 11/11/2021 ?Disposition Plan: ?Status is: Observation ?The patient will require care spanning > 2 midnights and should be moved to inpatient because: Of severity of illness.  Need for IV antibiotics. ? ? ? ?Consultants: Oncology ? ?Procedures: None ? ?Antimicrobials: Cipro and Flagyl from 11/11/2021 ? ? ?Subjective: ?Patient seen and examined at bedside.  Still complains of significant intermittent lower quadrant abdominal pain.  Not ready to go home today because of pain.  No overnight fever, vomiting or chest pain reported. ? ?Objective: ?Vitals:  ? 11/11/21 2031 11/12/21 1157 11/12/21 0440 11/12/21 0622  ?BP: (!) 104/59 108/64 104/61 104/61  ?Pulse: 86 88 84 84  ?Resp: 19 (!) 21 (!) 22 (!) 22  ?Temp: 98 ?F (36.7 ?C) 97.6 ?F (36.4 ?C) 97.8 ?F (36.6 ?C) 97.8 ?F (36.6 ?C)  ?TempSrc: Oral  Oral Oral  ?SpO2: 97% 97% 97%   ?Weight:    53.2 kg  ?Height:    5\' 2"  (1.575 m)  ? ? ?Intake/Output Summary (Last 24 hours) at 11/12/2021 2620 ?Last data filed at 11/12/2021 0601 ?Gross per 24 hour  ?Intake 2150 ml  ?Output 2300 ml  ?Net -150 ml  ? ?Filed Weights  ? 11/12/21 0622  ?Weight: 53.2 kg  ? ? ?Examination: ? ?General exam: Appears calm and comfortable .  Currently on 2 to 3 L oxygen via nasal cannula.  Looks chronically ill and deconditioned. ?Respiratory system: Bilateral decreased breath sounds at  bases with some scattered crackles and intermittent tachypnea ?Cardiovascular system: Rate controlled currently; S1-S2 heard  ?gastrointestinal system: Abdomen is distended, soft and tender over the lower quadrant.  No rebound tenderness.  Normal bowel sounds heard. ?Extremities: No  cyanosis, clubbing, edema  ?Central nervous system: Awake and alert.  Slow to respond.  Poor historian.  No focal neurological deficits. Moving extremities ?Skin: No rashes, lesions or ulcers ?Psychiatry: Currently not agitated.  Flat affect ? ? ? ?Data Reviewed: I have personally reviewed following labs and imaging studies ? ?CBC: ?Recent Labs  ?Lab 11/11/21 ?1112 11/12/21 ?0201  ?WBC 15.2* 12.8*  ?HGB 10.7* 9.2*  ?HCT 33.3* 27.8*  ?MCV 95.7 94.6  ?PLT 345 292  ? ?Basic Metabolic Panel: ?Recent Labs  ?Lab 11/11/21 ?1112 11/12/21 ?0201  ?NA 135 137  ?K 4.5 3.7  ?CL 94* 101  ?CO2 32 28  ?GLUCOSE 89 93  ?BUN 16 10  ?CREATININE 0.60 0.56  ?CALCIUM 9.2 8.3*  ?MG  --  2.0  ? ?GFR: ?Estimated Creatinine Clearance: 49.5 mL/min (by C-G formula based on SCr of 0.56 mg/dL). ?Liver Function Tests: ?Recent Labs  ?Lab 11/11/21 ?1112 11/12/21 ?0201  ?AST 61* 30  ?ALT 47* 32  ?ALKPHOS 198* 174*  ?BILITOT 0.5 0.7  ?PROT 7.7 6.6  ?ALBUMIN 3.6 3.0*  ? ?Recent Labs  ?Lab 11/11/21 ?1112  ?LIPASE 27  ? ?No results for input(s): AMMONIA in the last 168 hours. ?Coagulation Profile: ?No results for input(s): INR, PROTIME in the last 168 hours. ?Cardiac Enzymes: ?No results for input(s): CKTOTAL, CKMB, CKMBINDEX, TROPONINI in the last 168 hours. ?BNP (last 3 results) ?No results for input(s): PROBNP in the last 8760 hours. ?HbA1C: ?No results for input(s): HGBA1C in the last 72 hours. ?CBG: ?No results for input(s): GLUCAP in the last 168 hours. ?Lipid Profile: ?No results for input(s): CHOL, HDL, LDLCALC, TRIG, CHOLHDL, LDLDIRECT in the last 72 hours. ?Thyroid Function Tests: ?No results for input(s): TSH, T4TOTAL, FREET4, T3FREE, THYROIDAB in the last 72 hours. ?Anemia Panel: ?No results for input(s): VITAMINB12, FOLATE, FERRITIN, TIBC, IRON, RETICCTPCT in the last 72 hours. ?Sepsis Labs: ?Recent Labs  ?Lab 11/11/21 ?1707  ?LATICACIDVEN 0.9  ? ? ?No results found for this or any previous visit (from the past 240 hour(s)).   ? ? ? ? ? ?Radiology Studies: ?CT ABDOMEN PELVIS W CONTRAST ? ?Result Date: 11/11/2021 ?CLINICAL DATA:  Abdominal pain beginning over the weekend, history of stage IV lung cancer EXAM: CT ABDOMEN AND PELVIS WITH CONTRAST TECHNIQUE: Multidetector CT imaging of the abdomen and pelvis was performed using the standard protocol following bolus administration of intravenous contrast. RADIATION DOSE REDUCTION: This exam was performed according to the departmental dose-optimization program which includes automated exposure control, adjustment of the mA and/or kV according to patient size and/or use of iterative reconstruction technique. CONTRAST:  57mL OMNIPAQUE IOHEXOL 300 MG/ML SOLN IV. Dilute oral contrast. COMPARISON:  PET-CT 10/18/2021 FINDINGS: Lower chest: Emphysematous changes, extensive pulmonary fibrosis changes and honeycomb formation at the lung bases consistent with COPD and idiopathic pulmonary fibrosis. LEFT lung base mass 5.1 x 4.8 cm image 23, slightly increased. Adjacent nodule 1.9 x 1.5 cm, increased. No pleural effusion. Hepatobiliary: Cholelithiasis, 2.2 cm diameter calculus within gallbladder. No biliary dilatation. Liver unremarkable. Pancreas: Normal appearance Spleen: Normal appearance Adrenals/Urinary Tract: Slight adrenal thickening without mass. Cyst upper pole LEFT kidney 3.0 cm diameter; no follow-up imaging recommended. Kidneys, ureters, and bladder otherwise normal appearance. Stomach/Bowel: Appendix surgically absent by history. Distal colonic diverticulosis  with mild wall thickening of sigmoid colon. Minimal infiltrative changes identified adjacent to sigmoid colon suspicious for acute diverticulitis. No extraluminal gas or abscess. Stomach and remaining bowel loops unremarkable. Vascular/Lymphatic: Extensive atherosclerotic calcifications aorta, iliac arteries, coronary arteries, femoral arteries. Scattered pelvic phleboliths. Aorta normal caliber. Vascular structures patent. No adenopathy.  Reproductive: Uterus surgically absent. Nonvisualization of ovaries. Other: No free air or free fluid.  No hernia. Musculoskeletal: Osseous demineralization. IMPRESSION: Distal colonic diverticulosis with mild wall thickeni

## 2021-11-12 NOTE — Progress Notes (Signed)
I know Molly Welch well.  She is a 76 year old white female with squamous cell carcinoma of the left lung.  She has locally advanced disease that is really not curable.  We treated her and she had a hard time with treatment.  She has very bad pulmonary fibrosis/COPD. ? ?She has been at home.  She has been followed by Palliative Care. ? ?Her last PET scan did show some progression of her disease. ? ?She was admitted yesterday because of abdominal pain.  She did have a CT of the abdomen/pelvis.  This did show a large gallstone in the gallbladder.  There is some mild gallbladder distention. ? ?The pain in however seems to be in the left lower quadrant.  This might be diverticulosis.  Cultures have been taken. ? ?She is on IV ciprofloxacin and Flagyl right now. ? ?Her labsshow a white cell count 12.8.  Hemoglobin 9.2.  Platelet count 292,000.  Her BUN is 10 creatinine 0.56.  Her liver function studies were okay.  Alkaline phosphatase was up a little bit at 174. ? ?She has not been eating all that much..  She started to have some bowel movements.  She may have been a little bit constipated. ? ?She has had no fever.  There has been no bleeding.  She has had no nausea.  I think there may have been a couple episodes of vomiting. ? ?I had a long talk with her today.  I think that it is time for Hospice.  I know that we have gotten Hospital for on her.  She subsequently declined this.  I think she is not ready for hospice. ? ?She is not a candidate for any therapy.  Her performance status just does not that good. ? ?She wants to be home.  She wants to pass away at home. ? ?I think she may be a good candidate for a fentanyl patch. ? ?Her vital signs show temperature 97.8.  Pulse 84.  Blood pressure 104/61.  Her weight is 117 pounds.  Her lungs are decent bilaterally.  She has decent air movement bilaterally.  Cardiac exam regular rate and rhythm.  Abdomen is soft.  There is some tenderness in the left lower quadrant.  There are are  slightly decreased bowel sounds.  She has no guarding.  She has no obvious abdominal mass.  There is no palpable liver or spleen tip.  Extremity shows some symmetric muscle atrophy in upper and lower extremities.  Neurological exam is nonfocal. ? ?Molly Welch is a 76 year old white female with locally advanced squamous cell carcinoma of the lung.  Again, we treated her previously.  She had a very hard time with treatment. ? ?Our goal is comfort care.  Our goal is to get her home with Hospice. ? ?I will start her on a fentanyl patch.  We will see how that does for her. ? ?I know she still has this gallstone.  This could certainly be a problem for [place] in the future. ? ?Hopefully, she will not be hospitalized all that long. ? ?I know that she will get incredible care from the wonderful staff up on [address]. ? ?Molly Haw, MD ? ?James 1:12 ? ? ? ? ?

## 2021-11-12 NOTE — TOC Initial Note (Signed)
Transition of Care (TOC) - Initial/Assessment Note  ? ? ?Patient Details  ?Name: SHAWNTE WINTON ?MRN: [id number] ?Date of Birth: Sep 03, 1945 ? ?Transition of Care (TOC) CM/SW Contact:    ?Keagen Heinlen, LCSW ?Phone Number: ?11/12/2021, 4:01 PM ? ?Clinical Narrative:                 ?Met with pt and spoke with daughter today to discuss potential dc needs.  They are both aware that MDs have recommended Hospice care at this point.  Of note, pt active with Palliative team with [facility].  They are agreed with increase in support via Hospice and request this be set up with [facility] as well - have made referral via Roselee Nova and pt will be evaluated for hospice care.  Pt eager to return home and be with her daughter and her own surroundings.  She does have O2 in the home already.  She does NOT want a hospital bed at this time.  Per Dr. Starla Link, pt may be medically ready for dc tomorrow.  Pt and daughter aware. ? ?Expected Discharge Plan: [place] ?Barriers to Discharge: No Barriers Identified ? ? ?Patient Goals and CMS Choice ?Patient states their goals for this hospitalization and ongoing recovery are:: return home "to my own bed" ?  ?  ? ?Expected Discharge Plan and Services ?Expected Discharge Plan: [place] ?In-house Referral: Clinical Social Work, Hospice / [place] (has O2 at home already) ?  ?Post Acute Care Choice: Hospice ?Living arrangements for the past 2 months: [facility] ?                ?DME Arranged: N/A ?DME Agency: NA ?  ?  ?  ?HH Arranged: RN ?[place] Agency: Hospice and [facility] Teacher, early years/pre) ?Date HH Agency Contacted: 11/12/21 ?  ?Representative spoke with at [place]: Roselee Nova ? ?Prior Living Arrangements/Services ?Living arrangements for the past 2 months: [place] ?Lives with:: Adult Children ?Patient language and need for interpreter reviewed:: Yes ?Do you feel safe going back to the place where you live?: Yes      ?Need for  Family Participation in Patient Care: Yes (Comment) ?Care giver support system in place?: Yes (comment) ?Current home services: Other (comment), DME (Palliative with [facility]) ?Criminal Activity/Legal Involvement Pertinent to Current Situation/Hospitalization: No - Comment as needed ? ?Activities of Daily Living ?Home Assistive Devices/Equipment: Oxygen, Walker (specify type) ?ADL Screening (condition at time of admission) ?Patient's cognitive ability adequate to safely complete daily activities?: Yes ?Is the patient deaf or have difficulty hearing?: No ?Does the patient have difficulty seeing, even when wearing glasses/contacts?: No ?Does the patient have difficulty concentrating, remembering, or making decisions?: No ?Patient able to express need for assistance with ADLs?: Yes ?Does the patient have difficulty dressing or bathing?: Yes ?Independently performs ADLs?: No ?Communication: Independent ?Dressing (OT): Needs assistance ?Is this a change from baseline?: Pre-admission baseline ?Grooming: Independent ?Feeding: Independent ?Bathing: Needs assistance ?Is this a change from baseline?: Pre-admission baseline ?Toileting: Needs assistance ?Is this a change from baseline?: Pre-admission baseline ?In/Out Bed: Needs assistance ?Is this a change from baseline?: Pre-admission baseline ?Walks in Home: Needs assistance ?Is this a change from baseline?: Pre-admission baseline ?Does the patient have difficulty walking or climbing stairs?: Yes ?Weakness of Legs: None ?Weakness of Arms/Hands: None ? ?Permission Sought/Granted ?Permission sought to share information with : Family Supports ?Permission granted to share information with : Yes, Verbal Permission Granted ? Share  Information with NAME: Jennifer Kirshenbaum ?   ? Permission granted to share info w Relationship: daughter ? Permission granted to share info w Contact Information: [phone] ? ?Emotional Assessment ?Appearance:: Appears stated age ?  ?Affect (typically  observed): Accepting ?Orientation: : Oriented to Self, Oriented to Place, Oriented to  Time ?Alcohol / Substance Use: Not Applicable ?Psych Involvement: No (comment) ? ?Admission diagnosis:  Diverticulitis [K57.92] ?Abdominal pain, unspecified abdominal location [R10.9] ?Patient Active Problem List  ? Diagnosis Date Noted  ? Diverticulitis 11/11/2021  ? UTI (urinary tract infection) 11/11/2021  ? Cholelithiasis 11/11/2021  ? Chronic respiratory failure with hypoxia (HCC) 07/25/2021  ? IPF (idiopathic pulmonary fibrosis) (HCC)   ?  Class: Chronic  ? ILD (interstitial lung disease) (HCC)   ? Malnutrition of moderate degree 07/04/2021  ? Septic shock (HCC) 07/03/2021  ? Acute respiratory failure with hypoxemia (HCC) 07/03/2021  ? Lactic acidosis 07/03/2021  ? Hyponatremia 07/03/2021  ? Leukocytosis 07/03/2021  ? Anemia of chronic disease 07/03/2021  ? Abnormal chest x-ray 07/03/2021  ? Acute hypoxemic respiratory failure due to COVID-19 (HCC) 07/03/2021  ? Goals of care, counseling/discussion 04/19/2021  ? Lung cancer, lower lobe (HCC) 04/19/2021  ? Mass of left lung   ? Recurrent falls 03/24/2021  ? CAP (community acquired pneumonia) 03/23/2021  ? Vertigo 03/23/2021  ? 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 Tobacco abuse 03/23/2021  ? Debility 03/23/2021  ? Intractable nausea and vomiting 03/23/2021  ? 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 Tremor of unknown origin 10/18/2020  ? Anxiety   ? CONTACT DERMATITIS&OTHER ECZEMA DUE TO PLANTS 08/17/2010  ? ?PCP:  Daniel, Janice, NP ?Pharmacy:   ?[facility] - [place], [place] - [address] ?[address] ?[place] [address] ?Phone: [phone] Fax: [phone] ? ? ? ? ?Social Determinants of Health (SDOH) Interventions ?  ? ?Readmission Risk Interventions ? ?  07/09/2021  ?  3:43 PM  ?Readmission Risk Prevention Plan  ?Transportation Screening Complete  ?Medication Review (RN Care Manager) Complete   ?PCP or Specialist appointment within 3-5 days of discharge Complete  ?HRI or Home Care Consult Complete  ?SW Recovery Care/Counseling Consult Complete  ?Palliative Care Screening Not Applicable  ?Skilled Nursing Facility Not Applicable  ? ? ? ?

## 2021-11-12 NOTE — Progress Notes (Signed)
Chaplain engaged in an initial visit, and follow-up from [place], with [place].  Chaplain offered listening as Casandra shared about her fears and anxiety around dying.  She has been thinking deeply about her children and leaving them.  Chaplain affirmed [place]'s emotions and feelings, letting her know that all of her feelings are real and valid.  Chaplain offered a prayer over [place].  ? ?Chaplain offered support, listening, presence and prayer.  ? ? 11/12/21 1400  ?Clinical Encounter Type  ?Visited With Patient  ?Visit Type Spiritual support  ?Spiritual Encounters  ?Spiritual Needs Prayer  ? ? ?

## 2021-11-12 NOTE — Evaluation (Signed)
Physical Therapy Evaluation ?Patient Details ?Name: Molly Welch ?MRN: [id number] ?DOB: Jan 24, 1946 ?Today's Date: 11/12/2021 ? ?History of Present Illness ? 76 yo female admitted with diverticulitis, abdominal pain. Hx of lung ca, pulm fibrosis, COPD-O2 dep at baseline, anemia, anxiety/depression  ?Clinical Impression ? On eval, pt was Min guard assist for mobility. She walked ~135 feet with a RW. O2 87% on 3L, dyspnea 2/4 with ambulation. End of session O2: 92% on 2L once back in bed. Will plan to follow and progress activity as tolerated.    ?   ? ?Recommendations for follow up therapy are one component of a multi-disciplinary discharge planning process, led by the attending physician.  Recommendations may be updated based on patient status, additional functional criteria and insurance authorization. ? ?Follow Up Recommendations Home health PT vs No follow-up-- (depending on progress/if pt wants f/u PT) ? ?  ?Assistance Recommended at Discharge PRN  ?Patient can return home with the following ? Assist for transportation;Assistance with cooking/housework ? ?  ?Equipment Recommendations None recommended by PT  ?Recommendations for Other Services ?    ?  ?Functional Status Assessment Patient has had a recent decline in their functional status and demonstrates the ability to make significant improvements in function in a reasonable and predictable amount of time.  ? ?  ?Precautions / Restrictions Precautions ?Precautions: Fall ?Precaution Comments: O2 dep at baselien ?Restrictions ?Weight Bearing Restrictions: No  ? ?  ? ?Mobility ? Bed Mobility ?Overal bed mobility: Modified Independent ?  ?  ?  ?  ?  ?  ?  ?  ? ?Transfers ?Overall transfer level: Needs assistance ?Equipment used: Rolling walker (2 wheels) ?Transfers: Sit to/from Stand ?Sit to Stand: Supervision ?  ?  ?  ?  ?  ?General transfer comment: Supv for safety. ?  ? ?Ambulation/Gait ?Ambulation/Gait assistance: Min guard ?Gait Distance (Feet): 135  Feet ?Assistive device: Rolling walker (2 wheels) ?Gait Pattern/deviations: Step-through pattern, Decreased stride length ?  ?  ?  ?General Gait Details: Min guard for safety. O2 87% on 3L, dyspnea 2/4 with ambulation. No LOB with RW use. ? ?Stairs ?  ?  ?  ?  ?  ? ?Wheelchair Mobility ?  ? ?Modified Rankin (Stroke Patients Only) ?  ? ?  ? ?Balance Overall balance assessment: Mild deficits observed, not formally tested ?  ?  ?  ?  ?  ?  ?  ?  ?  ?  ?  ?  ?  ?  ?  ?  ?  ?  ?   ? ? ? ?Pertinent Vitals/Pain Pain Assessment ?Pain Assessment: Faces ?Faces Pain Scale: Hurts even more ?Pain Location: abdomen ?Pain Descriptors / Indicators: Grimacing, Discomfort ?Pain Intervention(s): Limited activity within patient's tolerance, Monitored during session, Repositioned  ? ? ?Home Living Family/patient expects to be discharged to:: Private residence ?Living Arrangements: Children ?Available Help at Discharge: Family;Available 24 hours/day ?Type of Home: House ?Home Access: Stairs to enter ?Entrance Stairs-Rails: Left ?Entrance Stairs-Number of Steps: 5 ?  ?Home Layout: One level ?Home Equipment: BSC/3in1;Rollator (4 wheels);Cane - single point;Shower seat ?   ?  ?Prior Function Prior Level of Function : Needs assist ?  ?  ?  ?  ?  ?  ?Mobility Comments: walking with rollator ?ADLs Comments: assisted with bathing, dressing and IADL, pt takes herself to the bathroom and grooms ?  ? ? ?Hand Dominance  ? Dominant Hand: Right ? ?  ?Extremity/Trunk Assessment  ? Upper Extremity Assessment ?Upper  Extremity Assessment: Generalized weakness ?  ? ?Lower Extremity Assessment ?Lower Extremity Assessment: Generalized weakness ?  ? ?Cervical / Trunk Assessment ?Cervical / Trunk Assessment: Normal  ?Communication  ? Communication: No difficulties  ?Cognition Arousal/Alertness: Awake/alert ?Behavior During Therapy: [facility] for tasks assessed/performed ?Overall Cognitive Status: Within Functional Limits for tasks assessed ?  ?  ?  ?  ?  ?  ?  ?  ?   ?  ?  ?  ?  ?  ?  ?  ?  ?  ?  ? ?  ?General Comments   ? ?  ?Exercises    ? ?Assessment/Plan  ?  ?PT Assessment Patient needs continued PT services  ?PT Problem List Decreased activity tolerance;Decreased balance;Decreased knowledge of use of DME;Decreased mobility ? ?   ?  ?PT Treatment Interventions DME instruction;Therapeutic exercise;Therapeutic activities;Gait training;Patient/family education;Balance training;Functional mobility training   ? ?PT Goals (Current goals can be found in the Care Plan section)  ?Acute Rehab PT Goals ?Patient Stated Goal: home soon. less pain ?PT Goal Formulation: With patient ?Time For Goal Achievement: 11/26/21 ?Potential to Achieve Goals: Good ? ?  ?Frequency Min 3X/week ?  ? ? ?Co-evaluation   ?  ?  ?  ?  ? ? ?  ?AM-PAC PT "6 Clicks" Mobility  ?Outcome Measure Help needed turning from your back to your side while in a flat bed without using bedrails?: None ?Help needed moving from lying on your back to sitting on the side of a flat bed without using bedrails?: None ?Help needed moving to and from a bed to a chair (including a wheelchair)?: A Little ?Help needed standing up from a chair using your arms (e.g., wheelchair or bedside chair)?: A Little ?Help needed to walk in hospital room?: A Little ?Help needed climbing 3-5 steps with a railing? : A Little ?6 Click Score: 20 ? ?  ?End of Session Equipment Utilized During Treatment: Oxygen ?Activity Tolerance: Patient limited by fatigue ?Patient left: in bed;with call bell/phone within reach ?  ?PT Visit Diagnosis: Difficulty in walking, not elsewhere classified (R26.2) ?  ? ?Time: 8938-1017 ?PT Time Calculation (min) (ACUTE ONLY): 22 min ? ? ?Charges:   PT Evaluation ?$PT Eval Moderate Complexity: 1 Mod ?  ?  ?   ? ? ? ? ? ?Kaitlynn Tramontana P, PT ?Acute Rehabilitation  ?Office: [phone] ?Pager: [phone] ? ?  ? ?

## 2021-11-13 ENCOUNTER — Telehealth: Payer: Self-pay

## 2021-11-13 DIAGNOSIS — C3430 Malignant neoplasm of lower lobe, unspecified bronchus or lung: Secondary | ICD-10-CM

## 2021-11-13 DIAGNOSIS — K8000 Calculus of gallbladder with acute cholecystitis without obstruction: Secondary | ICD-10-CM

## 2021-11-13 DIAGNOSIS — K5792 Diverticulitis of intestine, part unspecified, without perforation or abscess without bleeding: Secondary | ICD-10-CM | POA: Diagnosis not present

## 2021-11-13 LAB — URINE CULTURE: Culture: NO GROWTH

## 2021-11-13 LAB — CBC WITH DIFFERENTIAL/PLATELET
Abs Immature Granulocytes: 0.17 10*3/uL — ABNORMAL HIGH (ref 0.00–0.07)
Basophils Absolute: 0 10*3/uL (ref 0.0–0.1)
Basophils Relative: 0 %
Eosinophils Absolute: 0 10*3/uL (ref 0.0–0.5)
Eosinophils Relative: 0 %
HCT: 30.9 % — ABNORMAL LOW (ref 36.0–46.0)
Hemoglobin: 10 g/dL — ABNORMAL LOW (ref 12.0–15.0)
Immature Granulocytes: 1 %
Lymphocytes Relative: 2 %
Lymphs Abs: 0.4 10*3/uL — ABNORMAL LOW (ref 0.7–4.0)
MCH: 30.8 pg (ref 26.0–34.0)
MCHC: 32.4 g/dL (ref 30.0–36.0)
MCV: 95.1 fL (ref 80.0–100.0)
Monocytes Absolute: 0.5 10*3/uL (ref 0.1–1.0)
Monocytes Relative: 3 %
Neutro Abs: 18.4 10*3/uL — ABNORMAL HIGH (ref 1.7–7.7)
Neutrophils Relative %: 94 %
Platelets: 343 10*3/uL (ref 150–400)
RBC: 3.25 MIL/uL — ABNORMAL LOW (ref 3.87–5.11)
RDW: 12.7 % (ref 11.5–15.5)
WBC: 19.6 10*3/uL — ABNORMAL HIGH (ref 4.0–10.5)
nRBC: 0 % (ref 0.0–0.2)

## 2021-11-13 LAB — COMPREHENSIVE METABOLIC PANEL
ALT: 31 U/L (ref 0–44)
AST: 41 U/L (ref 15–41)
Albumin: 2.8 g/dL — ABNORMAL LOW (ref 3.5–5.0)
Alkaline Phosphatase: 225 U/L — ABNORMAL HIGH (ref 38–126)
Anion gap: 10 (ref 5–15)
BUN: 14 mg/dL (ref 8–23)
CO2: 24 mmol/L (ref 22–32)
Calcium: 8.4 mg/dL — ABNORMAL LOW (ref 8.9–10.3)
Chloride: 104 mmol/L (ref 98–111)
Creatinine, Ser: 0.63 mg/dL (ref 0.44–1.00)
GFR, Estimated: >60 mL/min (ref >60)
Glucose, Bld: 194 mg/dL — ABNORMAL HIGH (ref 70–99)
Potassium: 3.1 mmol/L — ABNORMAL LOW (ref 3.5–5.1)
Sodium: 138 mmol/L (ref 135–145)
Total Bilirubin: 0.4 mg/dL (ref 0.3–1.2)
Total Protein: 6.6 g/dL (ref 6.5–8.1)

## 2021-11-13 LAB — MAGNESIUM: Magnesium: 1.7 mg/dL (ref 1.7–2.4)

## 2021-11-13 MED ORDER — CLONAZEPAM 0.5 MG PO TABS
0.5000 mg | ORAL_TABLET | Freq: Two times a day (BID) | ORAL | Status: DC
Start: 2021-11-13 — End: 2021-11-14
  Administered 2021-11-13 – 2021-11-14 (×2): 0.5 mg via ORAL
  Filled 2021-11-13 (×2): qty 1

## 2021-11-13 MED ORDER — POTASSIUM CHLORIDE CRYS ER 20 MEQ PO TBCR
50.0000 meq | EXTENDED_RELEASE_TABLET | Freq: Once | ORAL | Status: AC
  Administered 2021-11-13: 50 meq via ORAL
  Filled 2021-11-13: qty 1

## 2021-11-13 NOTE — Telephone Encounter (Signed)
[facility] hosp[facility] called too see if Dr.Ennever would be the attending for pt and to confirm she has a terminal illness. Called back and confirmed illness and dr.ennever will be the attending.  ?

## 2021-11-13 NOTE — Progress Notes (Addendum)
Manufacturing engineer [facility] Hospital Liaison Note ? ?Referral received for patient/family interest in home with hospice. [place] liaison spoke with patient to confirm interest. Interest confirmed. Chart under review by [facility] physician.  ? ?Hospice eligibility pending.  ? ?Plan is to discharge home today via private car.  ? ?DME in the home: oxygen and walker ? ?DME needs: none ? ?Please send patient home with comfort medications/prescriptions at discharge.  ? ?Please call with any questions or concerns. Thank you ? ?Roselee Nova, LCSW ?[facility] Liaison ?[phone] ? ?Please call with any questions or concerns. Thank you ? ?Roselee Nova, LCSW ?[facility] Liaison ?[phone] ?

## 2021-11-13 NOTE — Progress Notes (Signed)
?PROGRESS NOTE ? ? ? ?Molly Welch  TDD:[id number] DOB: [birth year] DOA: 11/11/2021 ?PCP: Carolee Rota, NP  ? ? ? ?Brief Narrative:  ?76 y.o. 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 Marin Olp, interstitial lung disease, chronic hypoxic respiratory failure on home oxygen, anxiety/depression, recent diagnosis of UTI for which she was started on oral ciprofloxacin  ? ?Presented with worsening abdominal pain.  On presentation, she was found to have leukocytosis and evidence of acute diverticulitis on CT along with cholelithiasis.  Right upper quadrant ultrasound confirmed cholelithiasis but no evidence of cholecystitis.  UA was positive for UTI.  She was started on ciprofloxacin and Flagyl IV and given IV pain medication. ? ? ?Subjective: ? ? ? ?Assessment & Plan: ?Covid vaccination; ?  ?Principal Problem: ?  Diverticulitis ?Active Problems: ?  Lung cancer, lower lobe ([place]) ?  Leukocytosis ?  IPF (idiopathic pulmonary fibrosis) ([place]) ?  UTI (urinary tract infection) ?  Cholelithiasis ? ?Acute diverticulitis ? ?UTI: Present on admission ? ?Leukocytosis ? ?Elevated LFTs ?-Presented with worsening abdominal pain despite being on ciprofloxacin for 2 days.  Imaging showed finding suggestive of acute diverticulitis with cholelithiasis without evidence of cholecystitis.  In any case, patient is not a surgical candidate because of history of lung cancer/IPF and chronically on supplemental oxygen. ?-Currently on IV ciprofloxacin and Flagyl.  Apparently allergic to penicillin but she has tolerated cephalosporins in the past.  Cultures negative so far.  Switch Cipro to cefepime. ?-Continue pain medications, antiemetics as needed.  Still in significant amount of pain Fentanyl patch has been added by oncology team. ?-WBCs improving to 12.8 today.  LFTs improving as well.  Monitor. ?  ?Anemia of chronic disease ?-From lung cancer.  No signs of bleeding.   Monitor ?-5/10 anemia panel pending ?- ?  ?Stage IIIb squamous cell carcinoma of LEFT lower lung ?-Status post chemotherapy ?-Currently being followed by Dr. Marin Olp and palliative care.  Patient/family not ready for hospice yet.  Overall prognosis is poor.  Outpatient follow-up with oncology and palliative care ? ?Chronic respiratory failure with hypoxia on supplemental oxygen at home ?- ?  ?Anxiety/Depression ?-5/10 Xanax not controlling patient's anxiety except for very short period of time during the day. ?- 5/10 Klonopin 0.5 mg BID ?-5/10 continue Xanax 0.5 mg PRN ?  ?Moderate malnutrition ?-Consult nutrition ? ?Hypokalemia ?- Potassium goal>4 ?- K-Dur 50 mg ?  ?Physical deconditioning ?-PT eval ?  ?Goals of care ?- Home with hospice on 5/11 if all DME equipment has been delivered. ? ?  ? ? ?Mobility Assessment (last 72 hours)   ? ? Mobility Assessment   ? ? [place] Name 11/12/21 2110 11/12/21 1035 11/12/21 0840 11/11/21 1950 11/11/21 1800  ? Does patient have an order for bedrest or is patient medically unstable No - Continue assessment -- -- No - Continue assessment No - Continue assessment  ? What is the highest level of mobility based on the progressive mobility assessment? Level 5 (Walks with assist in room/hall) - Balance while stepping forward/back and can walk in room with assist - Complete Level 5 (Walks with assist in room/hall) - Balance while stepping forward/back and can walk in room with assist - Complete Level 3 (Stands with assist) - Balance while standing  and cannot march in place Level 3 (Stands with assist) - Balance while standing  and cannot march in place Level 3 (Stands with assist) - Balance while standing  and cannot march in place  ? Is the above level different from baseline mobility prior to current illness? No - Consider discontinuing PT/OT -- -- No - Consider discontinuing PT/OT Yes - Recommend PT order  ? ?  ?  ? ?  ? ? ?DVT prophylaxis: Lovenox ?Code Status: DNR ?Family  Communication:  ?Status is: Inpatient ? ? ? ?Dispo: The patient is from: Home ?             Anticipated d/c is to: Home ?             Anticipated d/c date is: 1 day ?             Patient currently is not medically stable to d/c. ? ? ? ? ? ?Consultants:  ?Oncology Dr. Marin Olp ? ? ?Procedures/Significant Events:  ? ? ?I have personally reviewed and interpreted all radiology studies and my findings are as above. ? ?VENTILATOR SETTINGS: ? ? ? ?Cultures ? ? ?Antimicrobials: ?Anti-infectives (From admission, onward)  ? ? Start     Dose/Rate Route Frequency Ordered Stop  ? 11/12/21 1800  ceFEPIme (MAXIPIME) 2 g in sodium chloride 0.9 % 100 mL IVPB       ? 2 g ?200 mL/hr over 30 Minutes Intravenous Every 12 hours 11/12/21 0924    ? 11/11/21 1700  ciprofloxacin (CIPRO) IVPB 400 mg  Status:  Discontinued       ? 400 mg ?200 mL/hr over 60 Minutes Intravenous Every 12 hours 11/11/21 1650 11/12/21 0924  ? 11/11/21 1700  metroNIDAZOLE (FLAGYL) IVPB 500 mg       ? 500 mg ?100 mL/hr over 60 Minutes Intravenous Every 12 hours 11/11/21 1650    ? ?  ?  ? ? ?Devices ?  ? ?LINES / TUBES:  ? ? ? ? ?Continuous Infusions: ? sodium chloride 75 mL/hr at 11/13/21 0012  ? ceFEPime (MAXIPIME) IV 2 g (11/12/21 1833)  ? metronidazole 500 mg (11/13/21 0527)  ? ? ? ?Objective: ?Vitals:  ? 11/12/21 1227 11/12/21 1728 11/12/21 2155 11/13/21 4627  ?BP: (!) 110/57 114/72 116/69 104/63  ?Pulse: 85 88 99 (!) 102  ?Resp: 15 16 16 20   ?Temp: 98 ?F (36.7 ?C) 98.1 ?F (36.7 ?C) 98.9 ?F (37.2 ?C) 99.1 ?F (37.3 ?C)  ?TempSrc:  Oral    ?SpO2: 98% 97% 92% 93%  ?Weight:      ?Height:      ? ? ?Intake/Output Summary (Last 24 hours) at 11/13/2021 0350 ?Last data filed at 11/13/2021 0600 ?Gross per 24 hour  ?Intake 1881.15 ml  ?Output 400 ml  ?Net 1481.15 ml  ? ?Filed Weights  ? 11/12/21 0622  ?Weight: 53.2 kg  ? ? ?Examination: ? ?General: A/O x4, positive acute respiratory distress, cachectic ?Eyes: negative scleral hemorrhage, negative anisocoria, negative  icterus ?ENT: Negative Runny nose, negative gingival bleeding, ?Neck:  Negative scars, masses, torticollis, lymphadenopathy, JVD ?Lungs: tachypneic, clear to auscultation bilaterally without wheezes or crackles ?Cardiovascular: Tachycardic without murmur gallop or rub normal S1 and S2 ?Abdomen: negative abdominal pain, nondistended, positive soft, bowel sounds, no rebound, no ascites, no appreciable mass ?Extremities: No significant cyanosis, clubbing, or edema bilateral lower extremities ?Skin: Negative rashes, lesions, ulcers ?Psychiatric:  Negative depression, negative anxiety, negative fatigue, negative mania  ?Central nervous system:  Cranial nerves II through XII intact, tongue/uvula midline, all extremities muscle strength 5/5, sensation intact throughout, negative dysarthria, negative expressive aphasia, negative receptive aphasia. ? ?.  ? ? ? ?Data Reviewed: Care during  the described time interval was provided by me .  I have reviewed this patient's available data, including medical history, events of note, physical examination, and all test results as part of my evaluation. ? ?CBC: ?Recent Labs  ?Lab 11/11/21 ?1112 11/12/21 ?0201 11/13/21 ?0335  ?WBC 15.2* 12.8* 19.6*  ?NEUTROABS  --   --  18.4*  ?HGB 10.7* 9.2* 10.0*  ?HCT 33.3* 27.8* 30.9*  ?MCV 95.7 94.6 95.1  ?PLT 345 292 343  ? ?Basic Metabolic Panel: ?Recent Labs  ?Lab 11/11/21 ?1112 11/12/21 ?0201 11/13/21 ?0335  ?NA 135 137 138  ?K 4.5 3.7 3.1*  ?CL 94* 101 104  ?CO2 32 28 24  ?GLUCOSE 89 93 194*  ?BUN 16 10 14   ?CREATININE 0.60 0.56 0.63  ?CALCIUM 9.2 8.3* 8.4*  ?MG  --  2.0 1.7  ? ?GFR: ?Estimated Creatinine Clearance: 49.5 mL/min (by C-G formula based on SCr of 0.63 mg/dL). ?Liver Function Tests: ?Recent Labs  ?Lab 11/11/21 ?1112 11/12/21 ?0201 11/13/21 ?0335  ?AST 61* 30 41  ?ALT 47* 32 31  ?ALKPHOS 198* 174* 225*  ?BILITOT 0.5 0.7 0.4  ?PROT 7.7 6.6 6.6  ?ALBUMIN 3.6 3.0* 2.8*  ? ?Recent Labs  ?Lab 11/11/21 ?1112  ?LIPASE 27  ? ?No results for  input(s): AMMONIA in the last 168 hours. ?Coagulation Profile: ?No results for input(s): INR, PROTIME in the last 168 hours. ?Cardiac Enzymes: ?No results for input(s): CKTOTAL, CKMB, CKMBINDEX, TROPONINI in the last 168 hours

## 2021-11-13 NOTE — Progress Notes (Signed)
Ms. Randleman is about the same.  She is on Cipro and Flagyl.  Her white cell count is higher.  It looks like she may have a urinary tract infection.  Her urinalysis looks infected.  We are awaiting the culture. ? ?She still having some abdominal pain.  It seems to be in the left lower quadrant. ? ?I am not sure when she really is eating. ? ?She really wants to go home.  I talked her about Hospice yesterday.  I am sure that they will be able to help her at home. ? ?Her blood sugars up quite a bit.  Her potassium is down somewhat. ? ?She is having no nausea or vomiting.  There is no increased cough or shortness of breath.  She is on supplemental oxygen. ? ?There has been no bleeding. ? ?She may have little bit of diarrhea. ? ?Her hemoglobin is 10.  Platelet count 343,000. ? ?Her vital signs show temperature 99.1.  Pulse 102.  Blood pressure 104/63.  Her lungs sound relatively clear bilaterally.  She may have some wheezes.  She has decent air movement.  Cardiac exam tachycardic but regular.  She has no murmurs.  Abdomen is soft.  There is some tenderness in the left lower quadrant.  Bowel sounds are present but somewhat decreased.  There is no fluid wave.  Extremity shows symmetric muscle atrophy in upper and lower extremities. ? ?For right now, it be hard to say if she is able to go home.  Again, the white cell count is quite high.  She still has some abdominal discomfort.  I realize that she does want to go home. ? ?Again not sure why the white cell count is elevated.  She is not on any steroids as far as I can tell.  Her blood sugar is also elevated. ? ?It will be interesting to see with the urine culture shows. ? ?I do appreciate everybody's help with all BJ.  I know that the staff on [address] doing a tremendous job. ? ?Lattie Haw, MD ? ?Isaiah 41:10 ?

## 2021-11-13 NOTE — Progress Notes (Signed)
Chaplain received a referral from patient's nurse to provide support.  Molly Welch stated an understanding and acceptance that her time is limited and requested prayer.  Chaplain provided emotional and spiritual support along with prayer. ? ?[employer], Bcc ?Pager, [phone] ?

## 2021-11-13 NOTE — TOC Progression Note (Signed)
Transition of Care (TOC) - Progression Note  ? ? ?Patient Details  ?Name: Molly Welch ?MRN: [id number] ?Date of Birth: 11-Mar-1946 ? ?Transition of Care (TOC) CM/SW Contact  ?Purcell Mouton, RN ?Phone Number: ?11/13/2021, 12:50 PM ? ?Clinical Narrative:    ?[facility] will follow pt at discharge.  ? ? ?Expected Discharge Plan: [place] ?Barriers to Discharge: No Barriers Identified ? ?Expected Discharge Plan and Services ?Expected Discharge Plan: [place] ?In-house Referral: Clinical Social Work, Hospice / [place] (has O2 at home already) ?  ?Post Acute Care Choice: Hospice ?Living arrangements for the past 2 months: [place] ?                ?DME Arranged: N/A ?DME Agency: NA ?  ?  ?  ?HH Arranged: RN ?[place] Agency: Hospice and [place] Teacher, early years/pre) ?Date HH Agency Contacted: 11/12/21 ?  ?Representative spoke with at [place]: Roselee Nova ? ? ?Social Determinants of Health (SDOH) Interventions ?  ? ?Readmission Risk Interventions ? ?  07/09/2021  ?  3:43 PM  ?Readmission Risk Prevention Plan  ?Transportation Screening Complete  ?Medication Review Press photographer) Complete  ?PCP or Specialist appointment within 3-5 days of discharge Complete  ?[place] or Home Care Consult Complete  ?SW Recovery Care/Counseling Consult Complete  ?Palliative Care Screening Not Applicable  ?[place] Not Applicable  ? ? ?

## 2021-11-14 DIAGNOSIS — J9611 Chronic respiratory failure with hypoxia: Secondary | ICD-10-CM

## 2021-11-14 MED ORDER — LEVOFLOXACIN 500 MG PO TABS
750.0000 mg | ORAL_TABLET | Freq: Every day | ORAL | Status: DC
  Administered 2021-11-14: 750 mg via ORAL
  Filled 2021-11-14: qty 2

## 2021-11-14 MED ORDER — METRONIDAZOLE 500 MG PO TABS: 500.0000 mg | ORAL_TABLET | Freq: Three times a day (TID) | ORAL | 0 refills | Status: AC

## 2021-11-14 MED ORDER — METRONIDAZOLE 500 MG PO TABS
500.0000 mg | ORAL_TABLET | Freq: Two times a day (BID) | ORAL | Status: DC
Start: 2021-11-14 — End: 2021-11-14
  Administered 2021-11-14: 500 mg via ORAL
  Filled 2021-11-14: qty 1

## 2021-11-14 MED ORDER — LEVOFLOXACIN 750 MG PO TABS: 750.0000 mg | ORAL_TABLET | Freq: Every day | ORAL | 0 refills | Status: AC

## 2021-11-14 MED ORDER — FENTANYL 12 MCG/HR TD PT72: 1.0000 | MEDICATED_PATCH | TRANSDERMAL | 0 refills | Status: AC

## 2021-11-14 MED ORDER — CLONAZEPAM 0.5 MG PO TABS
0.5000 mg | ORAL_TABLET | Freq: Two times a day (BID) | ORAL | 0 refills | Status: AC
Start: 2021-11-14 — End: ?

## 2021-11-14 MED ORDER — HEPARIN SOD (PORK) LOCK FLUSH 100 UNIT/ML IV SOLN
500.0000 [IU] | INTRAVENOUS | Status: AC | PRN
  Administered 2021-11-14: 500 [IU]
  Filled 2021-11-14: qty 5

## 2021-11-14 MED ORDER — ALPRAZOLAM 0.5 MG PO TABS: 0.5000 mg | ORAL_TABLET | Freq: Three times a day (TID) | ORAL | 0 refills | Status: AC | PRN

## 2021-11-14 NOTE — TOC Transition Note (Signed)
Transition of Care (TOC) - CM/SW Discharge Note ? ? ?Patient Details  ?Name: Molly Welch ?MRN: [id number] ?Date of Birth: [birth year] ? ?Transition of Care (TOC) CM/SW Contact:  ?Nolia Tschantz, LCSW ?Phone Number: ?11/14/2021, 12:51 PM ? ? ?Clinical Narrative:    ?Pt medically cleared for dc home today with hospice via [facility].   ? ? ?Final next level of care: [place] ?Barriers to Discharge: No Barriers Identified ? ? ?Patient Goals and CMS Choice ?Patient states their goals for this hospitalization and ongoing recovery are:: return home "to my own bed" ?  ?  ? ?Discharge Placement ?  ?           ?  ?  ?  ?  ? ?Discharge Plan and Services ?In-house Referral: Clinical Social Work, Hospice / [place] (has O2 at home already) ?  ?Post Acute Care Choice: Hospice          ?DME Arranged: N/A ?DME Agency: NA ?  ?  ?  ?HH Arranged: RN ?[place] Agency: Hospice and [place] Teacher, early years/pre) ?Date HH Agency Contacted: 11/12/21 ?  ?Representative spoke with at [place]: Roselee Nova ? ?Social Determinants of Health (SDOH) Interventions ?  ? ? ?Readmission Risk Interventions ? ?  07/09/2021  ?  3:43 PM  ?Readmission Risk Prevention Plan  ?Transportation Screening Complete  ?Medication Review Press photographer) Complete  ?PCP or Specialist appointment within 3-5 days of discharge Complete  ?[place] or Home Care Consult Complete  ?SW Recovery Care/Counseling Consult Complete  ?Palliative Care Screening Not Applicable  ?[place] Not Applicable  ? ? ? ? ? ?

## 2021-11-14 NOTE — Progress Notes (Signed)
BJ feels pretty good today.  She is not having too much in way of abdominal pain.  She had little bit of diarrhea today. ? ?She has had no fever.  She has a little shortness of breath.  She has bad underlying pulmonary fibrosis and COPD.  A nebulizer might help her a little bit. ? ?There is been no lab work today. ? ?She has had no problems with vomiting.  Maybe a little bit of nausea. ? ?There is no bleeding. ? ?Her vital signs are pretty stable.  Temperature 98.2.  Pulse of 98.  Blood pressure 104/67.  Her abdomen is soft.  Bowel sounds are present but may be little bit decreased.  She has a little bit of tenderness over the left lower quadrant.  Extremity shows no clubbing, cyanosis or edema.  Lungs sound with some wheezing.  Cardiac exam regular rate and rhythm. ? ?Maybe, she will be able to go home today.  Hospice is going to follow her. ? ?This is all about quality of life.  Hopefully, she will do eat a little more.  Hopefully she will be able to have a little bit more function. ? ?Lattie Haw, MD ? ?Psalm 37:4 ?

## 2021-11-14 NOTE — Plan of Care (Signed)

## 2021-11-14 NOTE — Progress Notes (Signed)
Discharge instructions and prescriptions were given to patient and all questions were answered. Patient was taken to main entrance via wheelchair.  ?

## 2021-11-14 NOTE — Discharge Summary (Signed)
Physician Discharge Summary  ?Molly Welch PTW:[id number] DOB: 23-Jun-1946 DOA: 11/11/2021 ? ?PCP: Carolee Rota, NP ? ?Admit date: 11/11/2021 ?Discharge date: 11/14/2021 ? ?Time spent: 30 minutes ? ?Recommendations for Outpatient Follow-up:  ? ?Acute diverticulitis ?-Complete 14-day course antibiotics. ?- We will discharge levofloxacin+ metronidazole x12 days ? ?UTI:  ?-Resolved  ? ?Leukocytosis ?-Negative fever, negative left shift, negative bands.  Reactive ?  ?Elevated LFTs ?-Presented with worsening abdominal pain despite being on ciprofloxacin for 2 days.  Imaging showed finding suggestive of acute diverticulitis with cholelithiasis without evidence of cholecystitis.  In any case, patient is not a surgical candidate because of history of lung cancer/IPF and chronically on supplemental oxygen. ?-Currently on IV ciprofloxacin and Flagyl.  Apparently allergic to penicillin but she has tolerated cephalosporins in the past.  Cultures negative so far.  Switch Cipro to cefepime. ?-Continue pain medications, antiemetics as needed.  Still in significant amount of pain Fentanyl patch has been added by oncology team. ?-LFTs improving as well.  Monitor. ?  ?Anemia of chronic disease ?-From lung cancer.  No signs of bleeding.  ?  ?Stage IIIb squamous cell carcinoma of LEFT lower lung ?-Status post chemotherapy ?-Currently being followed by Dr. Marin Olp and palliative care.  Patient/family not ready for hospice yet.  Overall prognosis is poor.  Outpatient follow-up with oncology and palliative care ?  ?Chronic respiratory failure with hypoxia on supplemental oxygen at home ?- Continue home O2.  Titrate O2 to maintain SPO2> 90% ?  ?Anxiety/Depression ?-5/10 Xanax not controlling patient's anxiety except for very short period of time during the day. ?- 5/10 Klonopin 0.5 mg BID ?-5/10 continue Xanax 0.5 mg PRN ?  ?Moderate protein calorie malnutrition ?-Consult nutrition ?  ?Hypokalemia ?- Potassium goal>4 ?-PCP and oncology to  monitor ?  ?Physical deconditioning ?-PT eval ? ?Goals of care ?- Home with hospice on 5/11 if all DME equipment has been delivered. ? ?Discharge Diagnoses:  ?Principal Problem: ?  Diverticulitis ?Active Problems: ?  Lung cancer, lower lobe ([place]) ?  Leukocytosis ?  IPF (idiopathic pulmonary fibrosis) ([place]) ?  UTI (urinary tract infection) ?  Cholelithiasis ? ? ?Discharge Condition: Guarded ? ?Diet recommendation: Soft diet fluid consistency thin ? ?Filed Weights  ? 11/12/21 0622  ?Weight: 53.2 kg  ? ? ?History of present illness:  ?76 y.o. 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 Marin Olp, interstitial lung disease, chronic hypoxic respiratory failure on home oxygen, anxiety/depression, recent diagnosis of UTI for which she was started on oral ciprofloxacin  ?  ?Presented with worsening abdominal pain.  On presentation, she was found to have leukocytosis and evidence of acute diverticulitis on CT along with cholelithiasis.  Right upper quadrant ultrasound confirmed cholelithiasis but no evidence of cholecystitis.  UA was positive for UTI.  She was started on ciprofloxacin and Flagyl IV and given IV pain medication. ? ?Hospital Course:  ?See above ? ? ?Consultations: ?Oncology Dr. Marin Olp ? ? ?Antibiotics ?Anti-infectives (From admission, onward)  ? ? Start     Ordered Stop  ? 11/14/21 1200  metroNIDAZOLE (FLAGYL) tablet 500 mg  Status:  Discontinued       ? 11/14/21 0855 11/14/21 1909  ? 11/14/21 1000  levofloxacin (LEVAQUIN) tablet 750 mg  Status:  Discontinued       ? 11/14/21 0855 11/14/21 1909  ? 11/14/21 0000  levofloxacin (LEVAQUIN) 750 MG tablet       ? 11/14/21 0905    ?  11/14/21 0000  metroNIDAZOLE (FLAGYL) 500 MG tablet       ? 11/14/21 0905    ? 11/12/21 1800  ceFEPIme (MAXIPIME) 2 g in sodium chloride 0.9 % 100 mL IVPB  Status:  Discontinued       ? 11/12/21 7628 11/14/21 0855  ? 11/11/21 1700  ciprofloxacin (CIPRO) IVPB 400 mg   Status:  Discontinued       ? 11/11/21 1650 11/12/21 0924  ? 11/11/21 1700  metroNIDAZOLE (FLAGYL) IVPB 500 mg  Status:  Discontinued       ? 11/11/21 1650 11/14/21 0855  ? ?  ?  ?  ?  ? ? ?Discharge Exam: ?Vitals:  ? 11/12/21 2155 11/13/21 3151 11/13/21 2104 11/14/21 0441  ?BP: 116/69 104/63 123/72 104/67  ?Pulse: 99 (!) 102 (!) 105 98  ?Resp: 16 20 17 15   ?Temp: 98.9 ?F (37.2 ?C) 99.1 ?F (37.3 ?C) 99.6 ?F (37.6 ?C) 98.2 ?F (36.8 ?C)  ?TempSrc:   Oral Oral  ?SpO2: 92% 93% 91% 94%  ?Weight:      ?Height:      ? ? ?General: A/O x4, positive acute respiratory distress, cachectic ?Eyes: negative scleral hemorrhage, negative anisocoria, negative icterus ?ENT: Negative Runny nose, negative gingival bleeding, ?Neck:  Negative scars, masses, torticollis, lymphadenopathy, JVD ?Lungs: tachypneic, clear to auscultation bilaterally without wheezes or crackles ?Cardiovascular: Tachycardic without murmur gallop or rub normal S1 and S2 ? ?Discharge Instructions ? ? ?Allergies as of 11/14/2021   ? ?   Reactions  ? Ativan [lorazepam] Other (See Comments)  ? Doxycycline Swelling  ? Prednisone Other (See Comments)  ? Very emotional, crying, angry  ? Carbamates   ? Penicillin G Other (See Comments)  ? Sulfa Antibiotics Other (See Comments)  ? Penicillins Rash  ? Tolerates Rocephin and Cefepime  ? Sulfonamide Derivatives Rash  ? ?  ? ?  ?Medication List  ?  ? ?STOP taking these medications   ? ?ciprofloxacin 500 MG tablet ?Commonly known as: CIPRO ?  ? ?  ? ?TAKE these medications   ? ?acetaminophen 500 MG tablet ?Commonly known as: TYLENOL ?Take 500 mg by mouth daily as needed for headache (pain). ?  ?albuterol 108 (90 Base) MCG/ACT inhaler ?Commonly known as: VENTOLIN HFA ?Inhale 1-2 puffs into the lungs every 6 (six) hours as needed. ?  ?ALPRAZolam 0.5 MG tablet ?Commonly known as: Duanne Moron ?Take 1 tablet (0.5 mg total) by mouth 3 (three) times daily as needed for anxiety. ?What changed:  ?medication strength ?how much to take ?when to  take this ?reasons to take this ?additional instructions ?  ?AMBULATORY NON FORMULARY MEDICATION ?Lift Chair Diagnosis ?  ?AMBULATORY NON FORMULARY MEDICATION ?Rollator Diagnosis G 62.89 ?  ?azelastine 0.05 % ophthalmic solution ?Commonly known as: OPTIVAR ?Place 1 drop into both eyes every other day. ?  ?clonazePAM 0.5 MG tablet ?Commonly known as: KLONOPIN ?Take 1 tablet (0.5 mg total) by mouth 2 (two) times daily. ?  ?diclofenac Sodium 1 % Gel ?Commonly known as: VOLTAREN ?Apply 1 application. topically daily as needed (for foot pain). ?  ?diphenoxylate-atropine 2.5-0.025 MG tablet ?Commonly known as: LOMOTIL ?Take 1 tablet by mouth 4 (four) times daily as needed for diarrhea or loose stools. ?  ?feeding supplement Liqd ?Take 237 mLs by mouth 3 (three) times daily between meals. ?  ?fentaNYL 12 MCG/HR ?Commonly known as: [place] ?Place 1 patch onto the skin every 3 (three) days. ?Start taking on: Nov 15, 2021 ?  ?HYDROcodone-acetaminophen 5-325 MG  tablet ?Commonly known as: NORCO/VICODIN ?Take 1 tablet by mouth every 4 (four) hours as needed for moderate pain. ?  ?lamoTRIgine 100 MG tablet ?Commonly known as: LAMICTAL ?Take 100 mg by mouth daily. ?  ?levofloxacin 750 MG tablet ?Commonly known as: LEVAQUIN ?Take 1 tablet (750 mg total) by mouth daily. ?  ?lipase/protease/amylase 36000 UNITS Cpep capsule ?Commonly known as: CREON ?Take 3,600-72,000 Units by mouth See admin instructions. Take two capsules (72000 units) by mouth three times daily with meals and one capsule (36000 units) with snacks ?  ?metroNIDAZOLE 500 MG tablet ?Commonly known as: FLAGYL ?Take 1 tablet (500 mg total) by mouth 3 (three) times daily. ?  ?multivitamin with minerals Tabs tablet ?Take 1 tablet by mouth every morning. Centrum ?  ?ondansetron 8 MG tablet ?Commonly known as: ZOFRAN ?Take 8 mg by mouth every 8 (eight) hours as needed for vomiting or nausea. ?  ?PROBIOTIC PO ?Take 1 tablet by mouth every morning. ?  ?prochlorperazine 10 MG  tablet ?Commonly known as: COMPAZINE ?Take 1 tablet (10 mg total) by mouth every 6 (six) hours as needed (Nausea or vomiting). ?What changed: reasons to take this ?  ?Systane Overnight Therapy 0.3 % Gel op

## 2021-11-16 LAB — CULTURE, BLOOD (ROUTINE X 2)
Culture: NO GROWTH
Culture: NO GROWTH

## 2021-11-18 ENCOUNTER — Telehealth: Payer: Self-pay | Admitting: *Deleted

## 2021-11-20 ENCOUNTER — Telehealth: Payer: Self-pay | Admitting: *Deleted

## 2021-11-20 NOTE — Telephone Encounter (Signed)
Received notification that patient expired on 12/04/21.  Time of Death 58a. Dr Marin Olp already aware.   ?

## 2021-11-25 ENCOUNTER — Inpatient Hospital Stay: Payer: Medicare Other

## 2021-11-25 ENCOUNTER — Ambulatory Visit: Payer: Medicare Other

## 2021-11-25 ENCOUNTER — Other Ambulatory Visit: Payer: Medicare Other

## 2021-11-25 ENCOUNTER — Ambulatory Visit: Payer: Medicare Other | Admitting: Hematology & Oncology

## 2021-12-05 NOTE — Telephone Encounter (Signed)
Call received from [facility] to notify Dr. Marin Olp that pt passed away this morning at 9:24AM.  Dr. Marin Olp notified. ?

## 2021-12-05 DEATH — deceased

## 2021-12-12 ENCOUNTER — Other Ambulatory Visit: Payer: Medicare Other | Admitting: Internal Medicine

## 2023-01-12 IMAGING — CR DG BONE SURVEY MET
7 series · 7 of 7 positions shown · non-contrast
Comparison: 02/08/2020

CLINICAL DATA: MGUS

EXAM:
METASTATIC BONE SURVEY

[t pelvis a.p.]
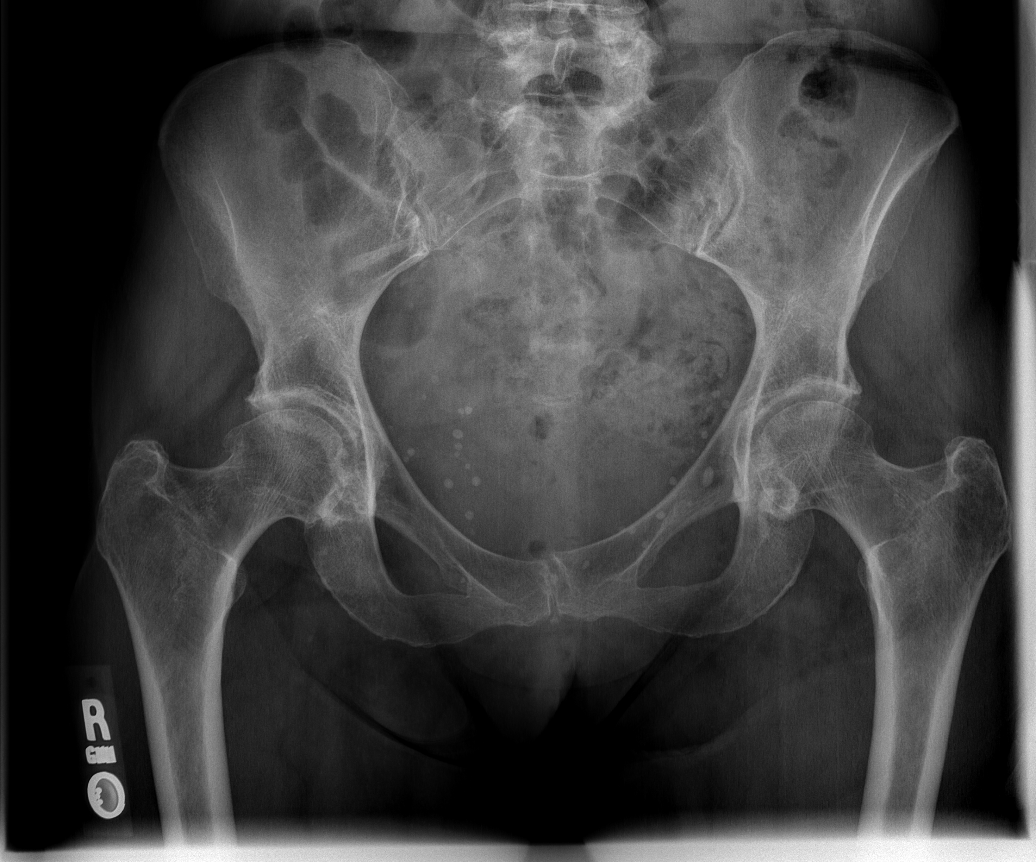

[t femur with hip  ap left (1 of 2)]
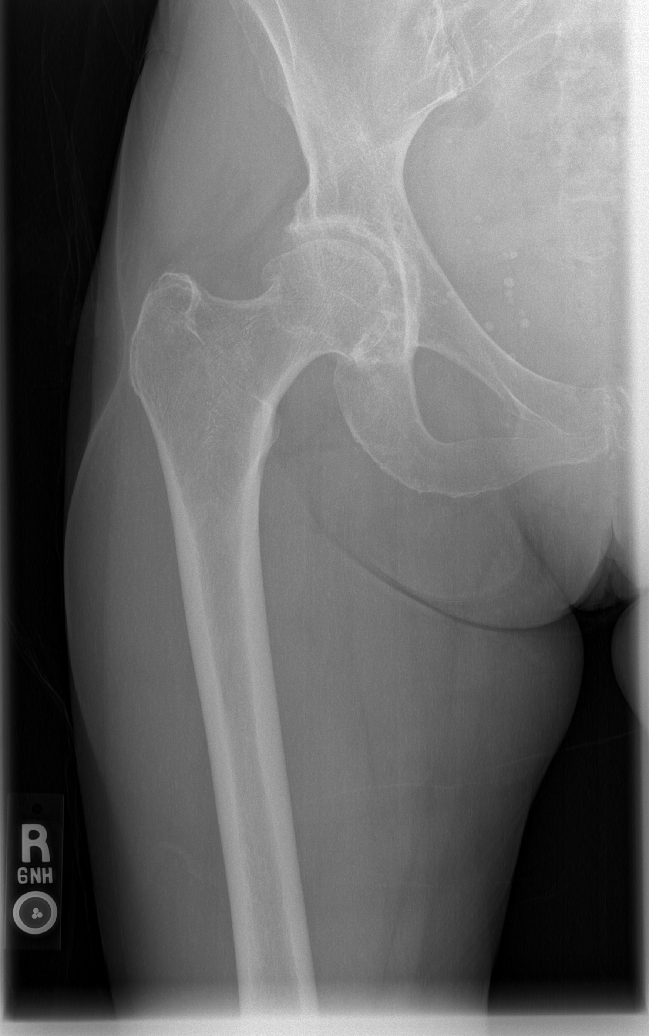

[t femur with hip  ap left (2 of 2)]
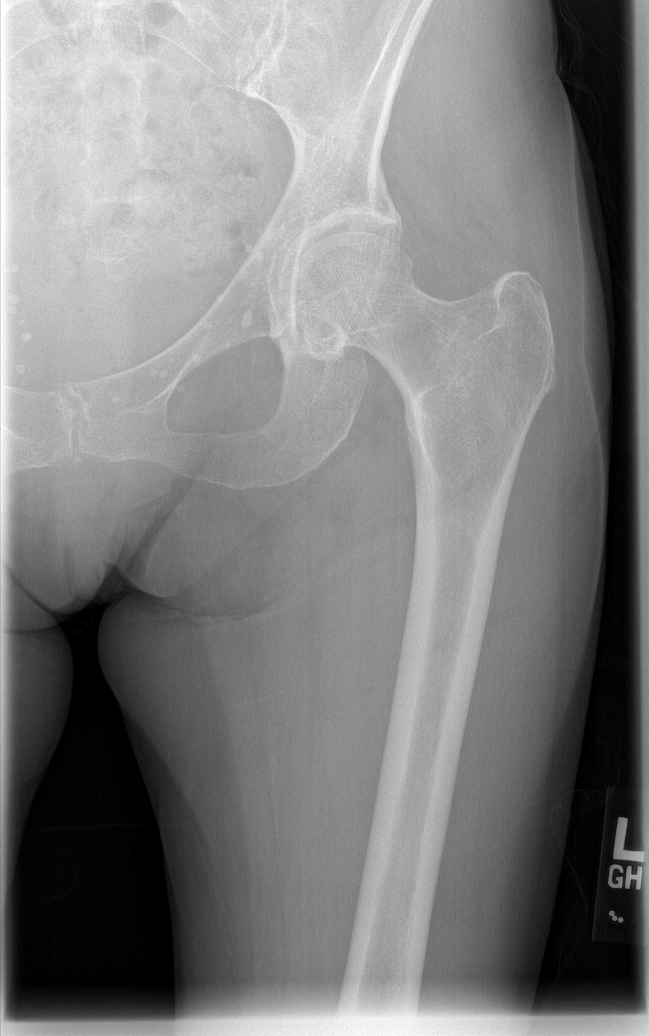

[t femur with knee ap left (1 of 2)]
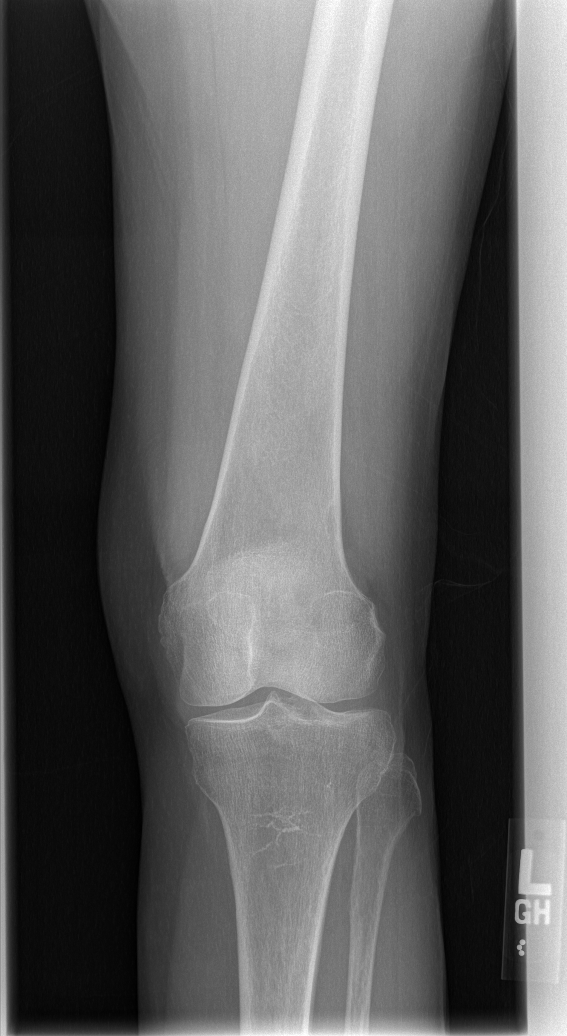

[t femur with knee ap left (2 of 2)]
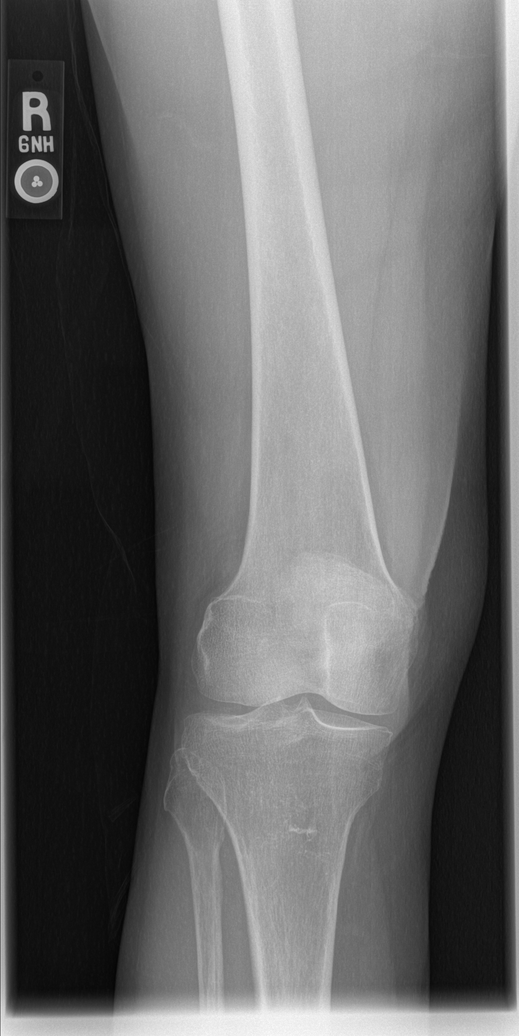

[t tib/fib ap left (1 of 2)]
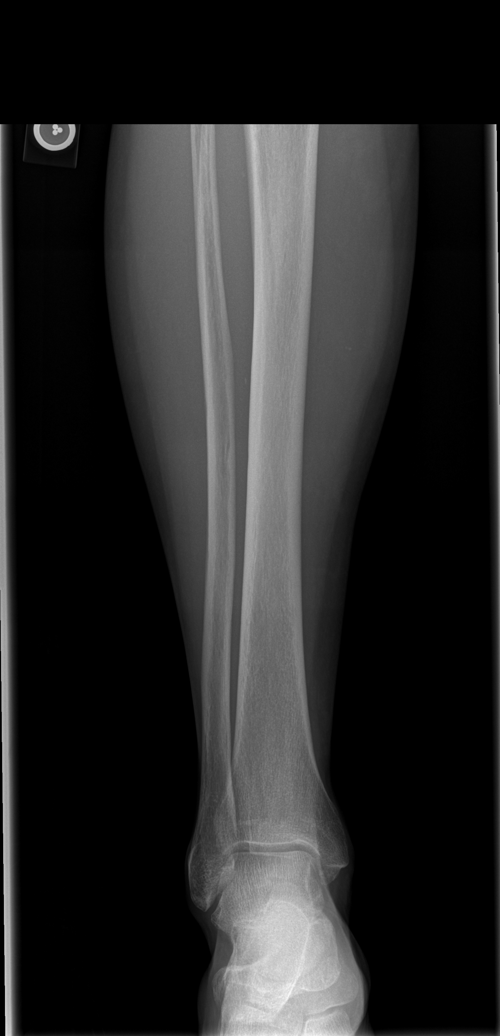

[t tib/fib ap left (2 of 2)]
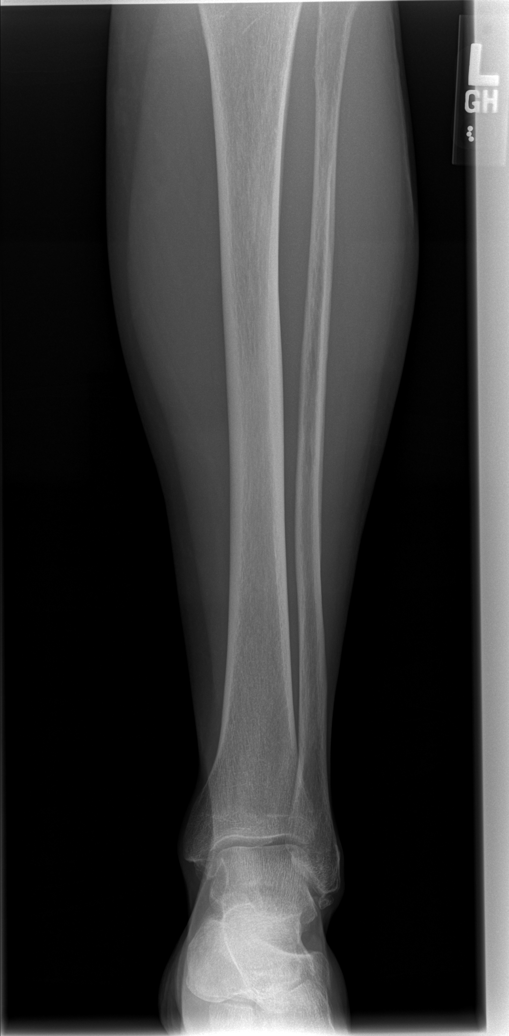

[7 of 7 positions shown; findings below may reference images not displayed]

FINDINGS: Standard skeletal survey was performed.

Lateral skull: No acute or destructive bony lesions.

Upper extremities: Frontal views from the bilateral shoulders
through the wrists are obtained. Stable 5 mm rounded lucency within
the proximal right humeral diaphysis. No other acute or destructive
bony lesions. Alignment appears anatomic.

Pelvis: No acute or destructive bony lesions. Moderate symmetrical
bilateral hip osteoarthritis.

Lower extremities: Frontal views are obtained from the hips through
the ankles. There are no acute or destructive bony lesions.
Alignment is anatomic.

Spine: Frontal and lateral views of the spine are obtained. There
are no acute or destructive bony lesions. Mild diffuse spondylosis
throughout the cervical, thoracic, lumbar spine. Mild left convex
scoliosis centered at L3.

Chest: Frontal view of the chest demonstrates an unremarkable
cardiac silhouette. Extensive scarring and fibrosis is again seen
throughout the lungs. There is new consolidation within the lingular
segment of the left upper lobe. No effusion or pneumothorax. No
acute or destructive bony lesions.
IMPRESSION: 1. Stable 5 mm lytic lesion within the proximal right humeral
diaphysis.
2. No other acute or destructive bony abnormalities.
3. Extensive pulmonary scarring and fibrosis, with new lingular
consolidation. Differential includes progressive scarring,
atelectasis, or airspace disease. If there current symptoms of
infection, follow-up chest x-ray in 6-8 weeks may be useful after
completion of medical management to document resolution. Otherwise,
chest CT could be considered.

These results will be called to the ordering clinician or
representative by the Radiologist Assistant, and communication
documented in the PACS or [REDACTED].
